# Patient Record
Sex: Male | Born: 1955 | Race: White | Hispanic: No | Marital: Married | Smoking: Former smoker
Health system: Southern US, Community
[De-identification: ages and names within clinical notes are randomized; demographics above are authoritative.]

## PROBLEM LIST (undated history)

## (undated) DIAGNOSIS — I10 Essential (primary) hypertension: Secondary | ICD-10-CM

## (undated) DIAGNOSIS — I2510 Atherosclerotic heart disease of native coronary artery without angina pectoris: Secondary | ICD-10-CM

## (undated) DIAGNOSIS — I5189 Other ill-defined heart diseases: Secondary | ICD-10-CM

## (undated) DIAGNOSIS — R600 Localized edema: Secondary | ICD-10-CM

## (undated) DIAGNOSIS — L309 Dermatitis, unspecified: Secondary | ICD-10-CM

## (undated) DIAGNOSIS — K219 Gastro-esophageal reflux disease without esophagitis: Secondary | ICD-10-CM

## (undated) DIAGNOSIS — I509 Heart failure, unspecified: Secondary | ICD-10-CM

## (undated) DIAGNOSIS — J45909 Unspecified asthma, uncomplicated: Secondary | ICD-10-CM

## (undated) DIAGNOSIS — K649 Unspecified hemorrhoids: Secondary | ICD-10-CM

## (undated) DIAGNOSIS — J189 Pneumonia, unspecified organism: Secondary | ICD-10-CM

## (undated) HISTORY — DX: Other ill-defined heart diseases: I51.89

## (undated) HISTORY — DX: Pneumonia, unspecified organism: J18.9

## (undated) HISTORY — DX: Unspecified asthma, uncomplicated: J45.909

## (undated) HISTORY — DX: Unspecified hemorrhoids: K64.9

## (undated) HISTORY — DX: Dermatitis, unspecified: L30.9

---
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
## 1993-02-22 HISTORY — PX: HEMORRHOID SURGERY: SHX153

## 2001-02-22 HISTORY — PX: OTHER SURGICAL HISTORY: SHX169

## 2001-06-08 ENCOUNTER — Ambulatory Visit (HOSPITAL_COMMUNITY): Admission: RE | Admit: 2001-06-08 | Discharge: 2001-06-08 | Payer: Self-pay | Admitting: *Deleted

## 2001-06-08 ENCOUNTER — Encounter: Payer: Self-pay | Admitting: Neurosurgery

## 2001-06-08 ENCOUNTER — Ambulatory Visit (HOSPITAL_COMMUNITY): Admission: RE | Admit: 2001-06-08 | Discharge: 2001-06-09 | Payer: Self-pay | Admitting: Neurosurgery

## 2005-08-02 ENCOUNTER — Ambulatory Visit: Payer: Self-pay | Admitting: Internal Medicine

## 2006-01-31 ENCOUNTER — Ambulatory Visit: Payer: Self-pay | Admitting: General Surgery

## 2009-08-12 ENCOUNTER — Ambulatory Visit: Payer: Self-pay | Admitting: Internal Medicine

## 2011-02-23 HISTORY — PX: COLONOSCOPY: SHX174

## 2011-06-04 ENCOUNTER — Ambulatory Visit: Payer: Self-pay | Admitting: General Surgery

## 2012-10-09 ENCOUNTER — Encounter: Payer: Self-pay | Admitting: *Deleted

## 2012-10-24 ENCOUNTER — Encounter: Payer: Self-pay | Admitting: General Surgery

## 2012-10-24 ENCOUNTER — Ambulatory Visit (INDEPENDENT_AMBULATORY_CARE_PROVIDER_SITE_OTHER): Payer: Managed Care, Other (non HMO) | Admitting: General Surgery

## 2012-10-24 VITALS — BP 140/90 | HR 80 | Resp 16 | Ht 70.0 in | Wt 221.0 lb

## 2012-10-24 DIAGNOSIS — M7989 Other specified soft tissue disorders: Secondary | ICD-10-CM

## 2012-10-24 DIAGNOSIS — R600 Localized edema: Secondary | ICD-10-CM | POA: Insufficient documentation

## 2012-10-24 NOTE — Progress Notes (Signed)
Patient ID: Alan Fernandez, male   DOB: 1956-02-03, 57 y.o.   MRN: [id number]  Chief Complaint  Patient presents with  . Other    leg swelling    HPI Alan Fernandez is a 57 y.o. male here today for swelling both leg for about 6 months. No history of any trauma. He works out of his home, has to sit for long periods. He did try some diuretic and compression hose and has improved. Patient stopped wearing his stocking last Wednesday.  HPI  Past Medical History  Diagnosis Date  . Asthma   . Hemorrhoid   . Eczema     Past Surgical History  Procedure Laterality Date  . Colonoscopy  2013  . Disk in neck  2003    History reviewed. No pertinent family history.  Social History History  Substance Use Topics  . Smoking status: Never Smoker   . Smokeless tobacco: Never Used  . Alcohol Use: Yes    No Known Allergies  Current Outpatient Prescriptions  Medication Sig Dispense Refill  . montelukast (SINGULAIR) 10 MG tablet Take 1 tablet by mouth daily.       No current facility-administered medications for this visit.    Review of Systems Review of Systems  Constitutional: Negative.   Respiratory: Negative.   Cardiovascular: Positive for leg swelling. Negative for chest pain and palpitations.    Blood pressure 140/90, pulse 80, resp. rate 16, height 5\' 10"  (1.778 m), weight 221 lb (100.245 kg).  Physical Exam Physical Exam  Constitutional: He is oriented to person, place, and time. He appears well-developed and well-nourished.  Eyes: Conjunctivae are normal. No scleral icterus.  Cardiovascular: Normal rate, regular rhythm, S1 normal and normal heart sounds.   Pulses:      Dorsalis pedis pulses are 2+ on the right side, and 2+ on the left side.       Posterior tibial pulses are 2+ on the right side, and 2+ on the left side.  modrate edema bilateral. Mild stasis changes in both legs. Lower part. No skin induration.  Pulmonary/Chest: Breath sounds normal.  Lymphadenopathy:    He has no cervical adenopathy.  Neurological: He is alert and oriented to person, place, and time.  Skin: Skin is warm and dry.    Data Reviewed None   Assessment   Likely has venous insufficeincy.      Plan   Patient to continued  to wear his stocking daily.Patient to return in six week.        SANKAR,SEEPLAPUTHUR G 10/24/2012, 11:37 AM

## 2012-10-24 NOTE — Patient Instructions (Addendum)
Patient to return in six weeks and wear his stockings daily.Rx given stocking with ankle pr of 30-80mm hg

## 2012-12-04 ENCOUNTER — Encounter: Payer: Self-pay | Admitting: General Surgery

## 2012-12-04 ENCOUNTER — Ambulatory Visit (INDEPENDENT_AMBULATORY_CARE_PROVIDER_SITE_OTHER): Payer: Managed Care, Other (non HMO) | Admitting: General Surgery

## 2012-12-04 VITALS — BP 124/70 | HR 74 | Resp 14 | Ht 70.0 in | Wt 221.0 lb

## 2012-12-04 DIAGNOSIS — I83893 Varicose veins of bilateral lower extremities with other complications: Secondary | ICD-10-CM

## 2012-12-04 NOTE — Progress Notes (Signed)
Patient presents for a 6 week follow up venous ultrasound. He says the leg swelling has improved as also the discomfort with use of compression hose. The patient complains of  left foot pain. The pain started approximately 1 month ago. The pain is described as a constant ache- especially when he wakes up in am.  Exam shows only scant edema in legs. No skin breaks noted. Stasis discoloration lower half of legs- less prominent today.    Venous Duplex Lower Extremities.  Duplex study of the common femoral vein femoral vein and optically laying on both sides reveals no evidence of DVT. The deep veins are easily compressible with no flow disturbance was noted. The right and left GSV are  showing abnormal reflux approaching 2 seconds.   Both LSV  are normal.   Above findings discussed with pt. He seems comfortable with use of compression hose and iis advised on continued usage.  If there is poor tolerance of this regimen or failure of compression to relieve leg swelling and discomfort, RF ablation can be considered.

## 2012-12-04 NOTE — Patient Instructions (Signed)
Patient advised to continue use of compression hose on a daily basis. Patient to return in 2 months.

## 2012-12-05 ENCOUNTER — Encounter: Payer: Self-pay | Admitting: General Surgery

## 2013-01-17 ENCOUNTER — Ambulatory Visit (INDEPENDENT_AMBULATORY_CARE_PROVIDER_SITE_OTHER): Payer: Managed Care, Other (non HMO) | Admitting: Podiatry

## 2013-01-17 ENCOUNTER — Encounter: Payer: Self-pay | Admitting: Podiatry

## 2013-01-17 ENCOUNTER — Ambulatory Visit (INDEPENDENT_AMBULATORY_CARE_PROVIDER_SITE_OTHER): Payer: Managed Care, Other (non HMO)

## 2013-01-17 VITALS — BP 141/83 | HR 86 | Resp 16 | Ht 70.0 in | Wt 210.0 lb

## 2013-01-17 DIAGNOSIS — M79609 Pain in unspecified limb: Secondary | ICD-10-CM

## 2013-01-17 DIAGNOSIS — M79672 Pain in left foot: Secondary | ICD-10-CM

## 2013-01-17 DIAGNOSIS — M722 Plantar fascial fibromatosis: Secondary | ICD-10-CM

## 2013-01-17 MED ORDER — METHYLPREDNISOLONE (PAK) 4 MG PO TABS: ORAL_TABLET | ORAL | Status: DC

## 2013-01-17 MED ORDER — MELOXICAM 15 MG PO TABS: 15.0000 mg | ORAL_TABLET | Freq: Every day | ORAL | Status: DC

## 2013-01-17 NOTE — Patient Instructions (Signed)
Plantar Fasciitis (Heel Spur Syndrome) with Rehab The plantar fascia is a fibrous, ligament-like, soft-tissue structure that spans the bottom of the foot. Plantar fasciitis is a condition that causes pain in the foot due to inflammation of the tissue. SYMPTOMS   Pain and tenderness on the underneath side of the foot.  Pain that worsens with standing or walking. CAUSES  Plantar fasciitis is caused by irritation and injury to the plantar fascia on the underneath side of the foot. Common mechanisms of injury include:  Direct trauma to bottom of the foot.  Damage to a small nerve that runs under the foot where the main fascia attaches to the heel bone.  Stress placed on the plantar fascia due to bone spurs. RISK INCREASES WITH:   Activities that place stress on the plantar fascia (running, jumping, pivoting, or cutting).  Poor strength and flexibility.  Improperly fitted shoes.  Tight calf muscles.  Flat feet.  Failure to warm-up properly before activity.  Obesity. PREVENTION  Warm up and stretch properly before activity.  Allow for adequate recovery between workouts.  Maintain physical fitness:  Strength, flexibility, and endurance.  Cardiovascular fitness.  Maintain a health body weight.  Avoid stress on the plantar fascia.  Wear properly fitted shoes, including arch supports for individuals who have flat feet. PROGNOSIS  If treated properly, then the symptoms of plantar fasciitis usually resolve without surgery. However, occasionally surgery is necessary. RELATED COMPLICATIONS   Recurrent symptoms that may result in a chronic condition.  Problems of the lower back that are caused by compensating for the injury, such as limping.  Pain or weakness of the foot during push-off following surgery.  Chronic inflammation, scarring, and partial or complete fascia tear, occurring more often from repeated injections. TREATMENT  Treatment initially involves the use of  ice and medication to help reduce pain and inflammation. The use of strengthening and stretching exercises may help reduce pain with activity, especially stretches of the Achilles tendon. These exercises may be performed at home or with a therapist. Your caregiver may recommend that you use heel cups of arch supports to help reduce stress on the plantar fascia. Occasionally, corticosteroid injections are given to reduce inflammation. If symptoms persist for greater than 6 months despite non-surgical (conservative), then surgery may be recommended.  MEDICATION   If pain medication is necessary, then nonsteroidal anti-inflammatory medications, such as aspirin and ibuprofen, or other minor pain relievers, such as acetaminophen, are often recommended.  Do not take pain medication within 7 days before surgery.  Prescription pain relievers may be given if deemed necessary by your caregiver. Use only as directed and only as much as you need.  Corticosteroid injections may be given by your caregiver. These injections should be reserved for the most serious cases, because they may only be given a certain number of times. HEAT AND COLD  Cold treatment (icing) relieves pain and reduces inflammation. Cold treatment should be applied for 10 to 15 minutes every 2 to 3 hours for inflammation and pain and immediately after any activity that aggravates your symptoms. Use ice packs or massage the area with a piece of ice (ice massage).  Heat treatment may be used prior to performing the stretching and strengthening activities prescribed by your caregiver, physical therapist, or athletic trainer. Use a heat pack or soak the injury in warm water. SEEK IMMEDIATE MEDICAL CARE IF:  Treatment seems to offer no benefit, or the condition worsens.  Any medications produce adverse side effects. EXERCISES RANGE   OF MOTION (ROM) AND STRETCHING EXERCISES - Plantar Fasciitis (Heel Spur Syndrome) These exercises may help you  when beginning to rehabilitate your injury. Your symptoms may resolve with or without further involvement from your physician, physical therapist or athletic trainer. While completing these exercises, remember:   Restoring tissue flexibility helps normal motion to return to the joints. This allows healthier, less painful movement and activity.  An effective stretch should be held for at least 30 seconds.  A stretch should never be painful. You should only feel a gentle lengthening or release in the stretched tissue. RANGE OF MOTION - Toe Extension, Flexion  Sit with your right / left leg crossed over your opposite knee.  Grasp your toes and gently pull them back toward the top of your foot. You should feel a stretch on the bottom of your toes and/or foot.  Hold this stretch for __________ seconds.  Now, gently pull your toes toward the bottom of your foot. You should feel a stretch on the top of your toes and or foot.  Hold this stretch for __________ seconds. Repeat __________ times. Complete this stretch __________ times per day.  RANGE OF MOTION - Ankle Dorsiflexion, Active Assisted  Remove shoes and sit on a chair that is preferably not on a carpeted surface.  Place right / left foot under knee. Extend your opposite leg for support.  Keeping your heel down, slide your right / left foot back toward the chair until you feel a stretch at your ankle or calf. If you do not feel a stretch, slide your bottom forward to the edge of the chair, while still keeping your heel down.  Hold this stretch for __________ seconds. Repeat __________ times. Complete this stretch __________ times per day.  STRETCH  Gastroc, Standing  Place hands on wall.  Extend right / left leg, keeping the front knee somewhat bent.  Slightly point your toes inward on your back foot.  Keeping your right / left heel on the floor and your knee straight, shift your weight toward the wall, not allowing your back to  arch.  You should feel a gentle stretch in the right / left calf. Hold this position for __________ seconds. Repeat __________ times. Complete this stretch __________ times per day. STRETCH  Soleus, Standing  Place hands on wall.  Extend right / left leg, keeping the other knee somewhat bent.  Slightly point your toes inward on your back foot.  Keep your right / left heel on the floor, bend your back knee, and slightly shift your weight over the back leg so that you feel a gentle stretch deep in your back calf.  Hold this position for __________ seconds. Repeat __________ times. Complete this stretch __________ times per day. STRETCH  Gastrocsoleus, Standing  Note: This exercise can place a lot of stress on your foot and ankle. Please complete this exercise only if specifically instructed by your caregiver.   Place the ball of your right / left foot on a step, keeping your other foot firmly on the same step.  Hold on to the wall or a rail for balance.  Slowly lift your other foot, allowing your body weight to press your heel down over the edge of the step.  You should feel a stretch in your right / left calf.  Hold this position for __________ seconds.  Repeat this exercise with a slight bend in your right / left knee. Repeat __________ times. Complete this stretch __________ times per day.    STRENGTHENING EXERCISES - Plantar Fasciitis (Heel Spur Syndrome)  These exercises may help you when beginning to rehabilitate your injury. They may resolve your symptoms with or without further involvement from your physician, physical therapist or athletic trainer. While completing these exercises, remember:   Muscles can gain both the endurance and the strength needed for everyday activities through controlled exercises.  Complete these exercises as instructed by your physician, physical therapist or athletic trainer. Progress the resistance and repetitions only as guided. STRENGTH - Towel  Curls  Sit in a chair positioned on a non-carpeted surface.  Place your foot on a towel, keeping your heel on the floor.  Pull the towel toward your heel by only curling your toes. Keep your heel on the floor.  If instructed by your physician, physical therapist or athletic trainer, add ____________________ at the end of the towel. Repeat __________ times. Complete this exercise __________ times per day. STRENGTH - Ankle Inversion  Secure one end of a rubber exercise band/tubing to a fixed object (table, pole). Loop the other end around your foot just before your toes.  Place your fists between your knees. This will focus your strengthening at your ankle.  Slowly, pull your big toe up and in, making sure the band/tubing is positioned to resist the entire motion.  Hold this position for __________ seconds.  Have your muscles resist the band/tubing as it slowly pulls your foot back to the starting position. Repeat __________ times. Complete this exercises __________ times per day.  Document Released: 02/08/2005 Document Revised: 05/03/2011 Document Reviewed: 05/23/2008 [employer] Patient Information 2014 [employer]. Plantar Fasciitis Plantar fasciitis is a common condition that causes foot pain. It is soreness (inflammation) of the band of tough fibrous tissue on the bottom of the foot that runs from the heel bone (calcaneus) to the ball of the foot. The cause of this soreness may be from excessive standing, poor fitting shoes, running on hard surfaces, being overweight, having an abnormal walk, or overuse (this is common in runners) of the painful foot or feet. It is also common in aerobic exercise dancers and ballet dancers. SYMPTOMS  Most people with plantar fasciitis complain of:  Severe pain in the morning on the bottom of their foot especially when taking the first steps out of bed. This pain recedes after a few minutes of walking.  Severe pain is experienced also during walking  following a long period of inactivity.  Pain is worse when walking barefoot or up stairs DIAGNOSIS   Your caregiver will diagnose this condition by examining and feeling your foot.  Special tests such as X-rays of your foot, are usually not needed. PREVENTION   Consult a sports medicine professional before beginning a new exercise program.  Walking programs offer a good workout. With walking there is a lower chance of overuse injuries common to runners. There is less impact and less jarring of the joints.  Begin all new exercise programs slowly. If problems or pain develop, decrease the amount of time or distance until you are at a comfortable level.  Wear good shoes and replace them regularly.  Stretch your foot and the heel cords at the back of the ankle (Achilles tendon) both before and after exercise.  Run or exercise on even surfaces that are not hard. For example, asphalt is better than pavement.  Do not run barefoot on hard surfaces.  If using a treadmill, vary the incline.  Do not continue to workout if you have foot or joint   problems. Seek professional help if they do not improve. HOME CARE INSTRUCTIONS   Avoid activities that cause you pain until you recover.  Use ice or cold packs on the problem or painful areas after working out.  Only take over-the-counter or prescription medicines for pain, discomfort, or fever as directed by your caregiver.  Soft shoe inserts or athletic shoes with air or gel sole cushions may be helpful.  If problems continue or become more severe, consult a sports medicine caregiver or your own health care provider. Cortisone is a potent anti-inflammatory medication that may be injected into the painful area. You can discuss this treatment with your caregiver. MAKE SURE YOU:   Understand these instructions.  Will watch your condition.  Will get help right away if you are not doing well or get worse. Document Released: 11/03/2000 Document  Revised: 05/03/2011 Document Reviewed: 01/03/2008 [employer] Patient Information 2014 [employer].  

## 2013-01-17 NOTE — Progress Notes (Signed)
  Subjective:    Patient ID: Alan Fernandez, male    DOB: Jul 11, 1955, 57 y.o.   MRN: [id number]  HPI Comments: N achey, rash  L left foot lateral side , top of foot rash  D 2 months  O all of sudden  C about the same depends on the day  A first step in morning or after sitting for a while  T no treatment   Foot Pain Associated symptoms include a rash.      Review of Systems  HENT:       Ringing in ears   Cardiovascular: Positive for leg swelling.  Musculoskeletal:       Difficulty walking   Skin: Positive for rash.  Hematological: Bruises/bleeds easily.  All other systems reviewed and are negative.       Objective:   Physical Exam: I have reviewed his past medical history medications and allergies. His review of systems is unremarkable and noncontributory to his symptoms. Vital signs are stable he is alert and oriented x3. Pulses are strongly palpable to the bilateral lower extremities. Capillary fill time to digits one through 5 bilateral is immediate. Neurologic sensorium is intact per since once the monofilament bilateral. Deep tendon reflexes are intact and brisk bilateral. Muscle strength was 5 over 5 dorsiflexors plantar flexors inverters and everters all intrinsic musculature is intact. He has tenderness on palpation of the fourth fifth metatarsal area of his left foot extending from the fourth fifth metatarsal base of the left foot distally toward the head of the fifth metatarsal. He has no pain on medial lateral compression of the calcaneus however upon palpation of the medial calcaneal tubercle he does have pain associated with this area radiographic evaluation does not demonstrate any type of osseous abnormalities in the foot whatsoever. He does have soft tissue increase in density at the plantar fascial calcaneal insertion site. This is indicative of plantar fasciitis. Cutaneous evaluation demonstrates supple well hydrated cutis. He does have a few small flaps to the dorsal  aspect of the left foot. These plaques a small an oval in nature slightly raise pain with a very thin scale. This is consistent with psoriasis however we cannot rule out eczematous dermatitis at this point.        Assessment & Plan:  Assessment: Plantar fasciitis left foot with lateral compensatory syndrome left.  Plan: We discussed the etiology pathology conservative versus surgical therapies. At this point I injected his left heel with 20 mg of Kenalog and local anesthetic. Put him in a plantar fascial brace. And dispensed a night splint. Wrote her prescription for Medrol Dosepak to be followed by Mobic. We discussed stretching exercises shoe gear modification and ice therapy. He was also given a handout on this. We'll followup with him in one month. He will call sooner if needed.

## 2013-02-06 ENCOUNTER — Ambulatory Visit (INDEPENDENT_AMBULATORY_CARE_PROVIDER_SITE_OTHER): Payer: Managed Care, Other (non HMO) | Admitting: General Surgery

## 2013-02-06 ENCOUNTER — Encounter: Payer: Self-pay | Admitting: General Surgery

## 2013-02-06 VITALS — BP 158/78 | HR 78 | Resp 14 | Ht 70.0 in | Wt 218.0 lb

## 2013-02-06 DIAGNOSIS — I83893 Varicose veins of bilateral lower extremities with other complications: Secondary | ICD-10-CM

## 2013-02-06 NOTE — Progress Notes (Signed)
Patient ID: Alan Fernandez, male   DOB: 07-06-1955, 57 y.o.   MRN: [id number]  Chief Complaint  Patient presents with  . Varicose Veins    HPI Alan Fernandez is a 57 y.o. male here today for varicose veins.Patient still is still wearing his compression   stockings. Says he has not had any leg symptoms. HPI  Past Medical History  Diagnosis Date  . Asthma   . Hemorrhoid   . Eczema     Past Surgical History  Procedure Laterality Date  . Colonoscopy  2013  . Disk in neck  2003    History reviewed. No pertinent family history.  Social History History  Substance Use Topics  . Smoking status: Never Smoker   . Smokeless tobacco: Never Used  . Alcohol Use: Yes    No Known Allergies  Current Outpatient Prescriptions  Medication Sig Dispense Refill  . clobetasol (TEMOVATE) 0.05 % external solution       . meloxicam (MOBIC) 15 MG tablet Take 1 tablet (15 mg total) by mouth daily.  30 tablet  3  . methylPREDNIsolone (MEDROL DOSPACK) 4 MG tablet follow package directions  21 tablet  0  . montelukast (SINGULAIR) 10 MG tablet Take 1 tablet by mouth daily.      Marland Kitchen NAFTIN 2 % GEL       . terbinafine (LAMISIL) 250 MG tablet Take 1 tablet by mouth daily.      . VENTOLIN HFA 108 (90 BASE) MCG/ACT inhaler        No current facility-administered medications for this visit.    Review of Systems Review of Systems  Blood pressure 158/78, pulse 78, resp. rate 14, height 5\' 10"  (1.778 m), weight 218 lb (98.884 kg).  Physical Exam Physical Exam  Constitutional: He is oriented to person, place, and time. He appears well-developed and well-nourished.  Cardiovascular:  Pulses:      Dorsalis pedis pulses are 2+ on the right side, and 2+ on the left side.       Posterior tibial pulses are 2+ on the right side, and 2+ on the left side.  Minimal swelling. No skin thickening or color changes. He seems comfortable with use of compression hose and is advised on continued usage.      Neurological: He is alert and oriented to person, place, and time.  Skin: Skin is warm and dry.    Data Reviewed Prior Duplex showing bilateral GSV reflux. Assessment    VV with good control usin compression hose.     Plan   Patient to return in six month for exam and ultrasound.       SANKAR,SEEPLAPUTHUR G 02/06/2013, 7:29 PM

## 2013-02-06 NOTE — Patient Instructions (Addendum)
Patient to return in six month for exam and ultrasound.

## 2013-02-12 ENCOUNTER — Encounter: Payer: Self-pay | Admitting: Podiatry

## 2013-02-12 ENCOUNTER — Ambulatory Visit (INDEPENDENT_AMBULATORY_CARE_PROVIDER_SITE_OTHER): Payer: Managed Care, Other (non HMO) | Admitting: Podiatry

## 2013-02-12 VITALS — BP 135/83 | HR 77 | Resp 16

## 2013-02-12 DIAGNOSIS — M722 Plantar fascial fibromatosis: Secondary | ICD-10-CM

## 2013-02-12 NOTE — Progress Notes (Signed)
   Subjective:    Patient ID: Alan Fernandez, male    DOB: February 10, 1956, 57 y.o.   MRN: [id number]  HPI Comments: "its a 100% better, does not hurt at all "     Review of Systems     Objective:   Physical Exam: Pulses remain palpable left foot. No pain on palpation medial continued tubercle of the left heel.        Assessment & Plan:  Assessment: Resolving plantar fasciitis left.  Plan: Continue all conservative therapies x1 month than taper off.

## 2013-08-08 ENCOUNTER — Ambulatory Visit (INDEPENDENT_AMBULATORY_CARE_PROVIDER_SITE_OTHER): Payer: Managed Care, Other (non HMO) | Admitting: General Surgery

## 2013-08-08 ENCOUNTER — Other Ambulatory Visit: Payer: Managed Care, Other (non HMO)

## 2013-08-08 ENCOUNTER — Encounter: Payer: Self-pay | Admitting: General Surgery

## 2013-08-08 VITALS — BP 130/64 | HR 70 | Resp 12 | Ht 70.0 in | Wt 221.0 lb

## 2013-08-08 DIAGNOSIS — I83893 Varicose veins of bilateral lower extremities with other complications: Secondary | ICD-10-CM

## 2013-08-08 DIAGNOSIS — I872 Venous insufficiency (chronic) (peripheral): Secondary | ICD-10-CM

## 2013-08-08 NOTE — Progress Notes (Signed)
Patient ID: Alan Fernandez, male   DOB: [birth year], 58 y.o.   MRN: [id number]  Chief Complaint  Patient presents with  . Varicose Veins    Alan Fernandez is a 58 y.o. male.  Here today for follow up varicose veins. He is wearing his compression stockings and have no symptoms. He says he has not noticed any discomfort with the use of the stockings.  Alan  Past Medical History  Diagnosis Date  . Asthma   . Hemorrhoid   . Eczema     Past Surgical History  Procedure Laterality Date  . Colonoscopy  2013  . Disk in neck  2003    History reviewed. No pertinent family history.  Social History History  Substance Use Topics  . Smoking status: Never Smoker   . Smokeless tobacco: Never Used  . Alcohol Use: Yes    No Known Allergies  Current Outpatient Prescriptions  Medication Sig Dispense Refill  . clobetasol (TEMOVATE) 0.05 % external solution       . montelukast (SINGULAIR) 10 MG tablet Take 1 tablet by mouth daily.      . VENTOLIN HFA 108 (90 BASE) MCG/ACT inhaler        No current facility-administered medications for this visit.    Review of Systems Review of Systems  Constitutional: Negative.   Respiratory: Negative.   Cardiovascular: Negative.     Blood pressure 130/64, pulse 70, resp. rate 12, height 5\' 10"  (1.778 m), weight 221 lb (100.245 kg).  Physical Exam Physical Exam  Constitutional: He is oriented to person, place, and time. He appears well-developed and well-nourished.  Neck: Neck supple.  Cardiovascular: Intact distal pulses.   1 + edema bilateral lower extremities which has markedly improved since last exam. No varicose veins noted. No induration of skin.  Lymphadenopathy:    He has no cervical adenopathy.  Neurological: He is alert and oriented to person, place, and time.  Skin: Skin is warm and dry.    Data Reviewed Prior office ultrasound and notes. Duplex study repeated today- both GSV with reflux in excess of 3secs. Was 2 secs 1 yr  ago. Single non prominent perforator seen inner aspect of both legs above ankle. Assessment    1 + edema bilateral lower extremities which has markedly improved since last exam. No varicose veins noted. No induration of skin.  Increase in reflux time in both GSV.  Venous insufficiency under good control.   Plan    Continue to wear compression hose. Follow up in one year.       SANKAR,SEEPLAPUTHUR G 08/09/2013, 9:23 AM

## 2013-08-08 NOTE — Patient Instructions (Addendum)
Continue to wear compression hose. The patient is aware to call back for any questions or concerns.

## 2013-08-09 ENCOUNTER — Encounter: Payer: Self-pay | Admitting: General Surgery

## 2013-08-09 DIAGNOSIS — I872 Venous insufficiency (chronic) (peripheral): Secondary | ICD-10-CM | POA: Insufficient documentation

## 2014-03-25 DIAGNOSIS — J189 Pneumonia, unspecified organism: Secondary | ICD-10-CM

## 2014-03-25 HISTORY — DX: Pneumonia, unspecified organism: J18.9

## 2014-06-26 ENCOUNTER — Ambulatory Visit (INDEPENDENT_AMBULATORY_CARE_PROVIDER_SITE_OTHER): Payer: Managed Care, Other (non HMO) | Admitting: General Surgery

## 2014-06-26 ENCOUNTER — Encounter: Payer: Self-pay | Admitting: General Surgery

## 2014-06-26 VITALS — BP 124/74 | HR 82 | Resp 14 | Ht 70.0 in | Wt 221.0 lb

## 2014-06-26 DIAGNOSIS — I872 Venous insufficiency (chronic) (peripheral): Secondary | ICD-10-CM | POA: Diagnosis not present

## 2014-06-26 NOTE — Progress Notes (Signed)
Patient ID: Alan Fernandez, male   DOB: 09-29-1955, 59 y.o.   MRN: [id number]  Chief Complaint  Patient presents with  . Follow-up    varicose veins and left leg edema    HPI Alan Fernandez is a 59 y.o. male.  Here today for follow up varicose veins and left leg edema. He states about 3 weeks ago he started having left lower lateral leg pain and edema. He states it has improved over the 3-4 days. Right leg is fine. He does admit to the left knee hurting. He did have lower back ache yesterday. He has been wear his compression hose (30-40) daily.  He does admit to some sinus congestion.  HPI     Past Medical History  Diagnosis Date  . Asthma   . Hemorrhoid   . Eczema   . Pneumonia Feb 2016    Past Surgical History  Procedure Laterality Date  . Colonoscopy  2013  . Disk in neck  2003    History reviewed. No pertinent family history.  Social History History  Substance Use Topics  . Smoking status: Never Smoker   . Smokeless tobacco: Never Used  . Alcohol Use: Yes    No Known Allergies  Current Outpatient Prescriptions  Medication Sig Dispense Refill  . clobetasol (TEMOVATE) 0.05 % external solution     . montelukast (SINGULAIR) 10 MG tablet Take 1 tablet by mouth daily.    . VENTOLIN HFA 108 (90 BASE) MCG/ACT inhaler      No current facility-administered medications for this visit.    Review of Systems Review of Systems  Constitutional: Negative.   Respiratory: Positive for cough.   Cardiovascular: Negative.   Neurological: Positive for headaches.    Blood pressure 124/74, pulse 82, resp. rate 14, height 5\' 10"  (1.778 m), weight 221 lb (100.245 kg).  Physical Exam Physical Exam  Constitutional: He is oriented to person, place, and time. He appears well-developed and well-nourished.  Eyes: Conjunctivae are normal. No scleral icterus.  Neck: Neck supple.  Cardiovascular: Normal rate, regular rhythm and normal heart sounds.   Pulses:      Dorsalis pedis  pulses are 2+ on the right side, and 2+ on the left side.       Posterior tibial pulses are 2+ on the right side, and 2+ on the left side.  Moderate bilateral lower leg edema present. No visible varicose veins.  Pulmonary/Chest: Effort normal and breath sounds normal.  Abdominal: Soft. There is no tenderness.  Lymphadenopathy:    He has no cervical adenopathy.  Neurological: He is alert and oriented to person, place, and time.  Skin: Skin is warm and dry.    Data Reviewed Prior notes.  Assessment    Pt has moderate edema in spite of compliant use of compression hose. He has had no symptoms to suggest cardiac or renal source for the edema.     Plan    Check chemistry and CBC. If labs are normal, will repeat Duplex study. He was noted to have bil GSV reflux to 2 secs -64yrs ago     PCP:  Wyatt Mage G 06/26/2014, 11:24 AM

## 2014-06-26 NOTE — Patient Instructions (Addendum)
The patient is aware to call back for any questions or concerns. Lab work today. Continue with compression stockings

## 2014-06-27 ENCOUNTER — Telehealth: Payer: Self-pay | Admitting: *Deleted

## 2014-06-27 LAB — CBC WITH DIFFERENTIAL/PLATELET
Basophils Absolute: 0 10*3/uL (ref 0.0–0.2)
Basos: 0 %
EOS (ABSOLUTE): 0 10*3/uL (ref 0.0–0.4)
Eos: 1 %
HEMATOCRIT: 40.1 % (ref 37.5–51.0)
HEMOGLOBIN: 14.2 g/dL (ref 12.6–17.7)
IMMATURE GRANS (ABS): 0 10*3/uL (ref 0.0–0.1)
IMMATURE GRANULOCYTES: 0 %
Lymphocytes Absolute: 1 10*3/uL (ref 0.7–3.1)
Lymphs: 24 %
MCH: 32.3 pg (ref 26.6–33.0)
MCHC: 35.4 g/dL (ref 31.5–35.7)
MCV: 91 fL (ref 79–97)
MONOCYTES: 14 %
Monocytes Absolute: 0.6 10*3/uL (ref 0.1–0.9)
NEUTROS ABS: 2.7 10*3/uL (ref 1.4–7.0)
Neutrophils: 61 %
Platelets: 180 10*3/uL (ref 150–379)
RBC: 4.4 x10E6/uL (ref 4.14–5.80)
RDW: 13.1 % (ref 12.3–15.4)
WBC: 4.3 10*3/uL (ref 3.4–10.8)

## 2014-06-27 LAB — COMPREHENSIVE METABOLIC PANEL
A/G RATIO: 2.3 (ref 1.1–2.5)
ALK PHOS: 62 IU/L (ref 39–117)
ALT: 19 IU/L (ref 0–44)
AST: 21 IU/L (ref 0–40)
Albumin: 4.1 g/dL (ref 3.5–5.5)
BUN/Creatinine Ratio: 7 — ABNORMAL LOW (ref 9–20)
BUN: 10 mg/dL (ref 6–24)
Bilirubin Total: 0.8 mg/dL (ref 0.0–1.2)
CO2: 23 mmol/L (ref 18–29)
Calcium: 8.9 mg/dL (ref 8.7–10.2)
Chloride: 100 mmol/L (ref 97–108)
Creatinine, Ser: 1.39 mg/dL — ABNORMAL HIGH (ref 0.76–1.27)
GFR calc non Af Amer: 55 mL/min/{1.73_m2} — ABNORMAL LOW (ref >59)
GFR, EST AFRICAN AMERICAN: 64 mL/min/{1.73_m2} (ref >59)
Globulin, Total: 1.8 g/dL (ref 1.5–4.5)
Glucose: 91 mg/dL (ref 65–99)
POTASSIUM: 4.2 mmol/L (ref 3.5–5.2)
SODIUM: 139 mmol/L (ref 134–144)
Total Protein: 5.9 g/dL — ABNORMAL LOW (ref 6.0–8.5)

## 2014-06-27 NOTE — Progress Notes (Signed)
Quick Note:  Inform pt labs are normal. F/u as scheduled Copy to Dr. Hall Busing ______

## 2014-06-27 NOTE — Telephone Encounter (Signed)
Notified patient as instructed, patient pleased. Discussed follow-up appointments, patient agrees  

## 2014-06-27 NOTE — Telephone Encounter (Signed)
-----   Message from Christene Lye, MD sent at 06/27/2014  6:15 AM EDT ----- Inform pt labs are normal. F/u as scheduled Copy to Dr. Hall Busing

## 2014-08-07 ENCOUNTER — Telehealth: Payer: Self-pay | Admitting: *Deleted

## 2014-08-07 ENCOUNTER — Ambulatory Visit: Payer: Managed Care, Other (non HMO)

## 2014-08-07 ENCOUNTER — Ambulatory Visit (INDEPENDENT_AMBULATORY_CARE_PROVIDER_SITE_OTHER): Payer: Managed Care, Other (non HMO) | Admitting: General Surgery

## 2014-08-07 ENCOUNTER — Encounter: Payer: Self-pay | Admitting: General Surgery

## 2014-08-07 VITALS — BP 154/70 | HR 80 | Resp 12 | Ht 70.0 in | Wt 216.0 lb

## 2014-08-07 DIAGNOSIS — I872 Venous insufficiency (chronic) (peripheral): Secondary | ICD-10-CM

## 2014-08-07 NOTE — Progress Notes (Signed)
Patient ID: EDOARDO LAFORTE, male   DOB: 1955-12-16, 59 y.o.   MRN: [id number]  Chief Complaint  Patient presents with  . Other    varicose veins and left leg edema    HPI SAMUEL MCPEEK is a 59 y.o. male here today for his one month follow for left leg edema.  He states the swelling is as bad as it was. He is wearing his compression hose daily. The left leg does still hurts  mostly in the knee area. On weekends, legs will be achy by the end of the day.   HPI  Past Medical History  Diagnosis Date  . Asthma   . Hemorrhoid   . Eczema   . Pneumonia Feb 2016    Past Surgical History  Procedure Laterality Date  . Colonoscopy  2013  . Disk in neck  2003    History reviewed. No pertinent family history.  Social History History  Substance Use Topics  . Smoking status: Never Smoker   . Smokeless tobacco: Never Used  . Alcohol Use: Yes    No Known Allergies  Current Outpatient Prescriptions  Medication Sig Dispense Refill  . clobetasol (TEMOVATE) 0.05 % external solution     . montelukast (SINGULAIR) 10 MG tablet Take 1 tablet by mouth daily.    . VENTOLIN HFA 108 (90 BASE) MCG/ACT inhaler      No current facility-administered medications for this visit.    Review of Systems Review of Systems  Constitutional: Negative.   Respiratory: Negative.   Cardiovascular: Negative.     Blood pressure 154/70, pulse 80, resp. rate 12, height 5\' 10"  (1.778 m), weight 216 lb (97.977 kg).  Physical Exam Physical Exam  Constitutional: He is oriented to person, place, and time. He appears well-developed and well-nourished.  Cardiovascular:  Pulses:      Dorsalis pedis pulses are 2+ on the right side, and 2+ on the left side.       Posterior tibial pulses are 2+ on the right side, and 2+ on the left side.  Mild to moderate edema in both legs. Some skin discoloration on right lower leg, medially, without induration or tenderness.  Pulmonary/Chest: Effort normal.  Neurological: He  is alert and oriented to person, place, and time.  Skin: Skin is warm and dry.    Data Reviewed Previous office notes. Left lower extremity duplex study was performed, showing significant reflux in GSV. Also has similar reflux in the right GSV. Deep system appears to be normal.  Assessment    Venous insufficiency bilaterally contributing to leg swelling and end of the day discomfort. Suspect that knee pain is not related to venous insufficiency and since this knee pain is significant, recommend orthopedic evaluation.     Plan   Use udder cream on legs. Call office if pain, thickening, discoloration, and soreness noted in lower legs.   Get knee evaluated by orthopedist.  Follow up in one month      PCP:  Donnamarie Poag 08/07/2014, 3:53 PM

## 2014-08-07 NOTE — Telephone Encounter (Signed)
Patient has been scheduled for an appointment with Dr. Thornton Park, orthopedist, for 08-15-14 at 3 pm (arrive 2:50 pm).   He is aware of date, time, and instructions. Patient verbalizes understanding.

## 2014-08-07 NOTE — Patient Instructions (Addendum)
The patient is aware to call back for any questions or concerns. continue to wear compression hose Follow up in one month

## 2014-08-19 ENCOUNTER — Ambulatory Visit: Payer: Self-pay | Admitting: General Surgery

## 2014-09-05 ENCOUNTER — Ambulatory Visit (INDEPENDENT_AMBULATORY_CARE_PROVIDER_SITE_OTHER): Payer: Managed Care, Other (non HMO) | Admitting: General Surgery

## 2014-09-05 ENCOUNTER — Encounter: Payer: Self-pay | Admitting: General Surgery

## 2014-09-05 VITALS — BP 158/72 | HR 76 | Resp 14 | Ht 70.0 in | Wt 217.0 lb

## 2014-09-05 DIAGNOSIS — I872 Venous insufficiency (chronic) (peripheral): Secondary | ICD-10-CM

## 2014-09-05 NOTE — Patient Instructions (Signed)
Follow up in one year. Continue to wear compression stockings daily.

## 2014-09-05 NOTE — Progress Notes (Signed)
Patient ID: Alan Fernandez, male   DOB: 1955-05-08, 59 y.o.   MRN: [id number]  Chief Complaint  Patient presents with  . Other    varicose veins    HPI Alan Fernandez is a 59 y.o. male here for follow up for varicose veins. He states that he has been doing well and has had no problems with swelling in his legs. He has been wearing his compression hose daily.  HPI  Past Medical History  Diagnosis Date  . Asthma   . Hemorrhoid   . Eczema   . Pneumonia Feb 2016    Past Surgical History  Procedure Laterality Date  . Colonoscopy  2013  . Disk in neck  2003    History reviewed. No pertinent family history.  Social History History  Substance Use Topics  . Smoking status: Never Smoker   . Smokeless tobacco: Never Used  . Alcohol Use: Yes    No Known Allergies  Current Outpatient Prescriptions  Medication Sig Dispense Refill  . clobetasol (TEMOVATE) 0.05 % external solution     . montelukast (SINGULAIR) 10 MG tablet Take 1 tablet by mouth daily.    . VENTOLIN HFA 108 (90 BASE) MCG/ACT inhaler      No current facility-administered medications for this visit.    Review of Systems Review of Systems  Constitutional: Negative.   Respiratory: Negative.   Cardiovascular: Negative.   Skin: Negative.     Blood pressure 158/72, pulse 76, resp. rate 14, height 5\' 10"  (1.778 m), weight 217 lb (98.431 kg).  Physical Exam Physical Exam  Constitutional: He is oriented to person, place, and time. He appears well-developed and well-nourished.  Neurological: He is alert and oriented to person, place, and time.  Skin: Skin is warm, dry and intact.  No skin thickening in the lower legs.  All pulses good. Mild edema in both legs, but significantly better than previous exam. No stasis changes in the leg.   Psychiatric: He has a normal mood and affect.    Data Reviewed Prior notes  Assessment    Venous insufficiemncy. Documented bilateral GSV reflux. Pt with no symptoms other  than mild swelling     Plan    Continuw use of compression hose.     PCP: Dr Trudie Reed 09/09/2014, 8:02 AM

## 2014-09-09 ENCOUNTER — Encounter: Payer: Self-pay | Admitting: General Surgery

## 2014-10-31 ENCOUNTER — Other Ambulatory Visit: Payer: Self-pay | Admitting: Internal Medicine

## 2014-10-31 DIAGNOSIS — R1032 Left lower quadrant pain: Secondary | ICD-10-CM

## 2014-11-05 ENCOUNTER — Ambulatory Visit: Admission: RE | Admit: 2014-11-05 | Payer: Managed Care, Other (non HMO) | Source: Ambulatory Visit

## 2014-11-08 ENCOUNTER — Ambulatory Visit
Admission: RE | Admit: 2014-11-08 | Discharge: 2014-11-08 | Disposition: A | Discharge status: Home / Self Care | Payer: Managed Care, Other (non HMO) | Source: Ambulatory Visit | Attending: Internal Medicine | Admitting: Internal Medicine

## 2014-11-08 DIAGNOSIS — K769 Liver disease, unspecified: Secondary | ICD-10-CM | POA: Insufficient documentation

## 2014-11-08 DIAGNOSIS — R1900 Intra-abdominal and pelvic swelling, mass and lump, unspecified site: Secondary | ICD-10-CM | POA: Diagnosis not present

## 2014-11-08 DIAGNOSIS — K469 Unspecified abdominal hernia without obstruction or gangrene: Secondary | ICD-10-CM | POA: Insufficient documentation

## 2014-11-08 DIAGNOSIS — I2510 Atherosclerotic heart disease of native coronary artery without angina pectoris: Secondary | ICD-10-CM | POA: Insufficient documentation

## 2014-11-08 DIAGNOSIS — R1032 Left lower quadrant pain: Secondary | ICD-10-CM | POA: Insufficient documentation

## 2014-11-08 MED ORDER — IOHEXOL 300 MG/ML  SOLN
100.0000 mL | Freq: Once | INTRAMUSCULAR | Status: AC | PRN
  Administered 2014-11-08: 100 mL via INTRAVENOUS

## 2014-11-14 ENCOUNTER — Encounter: Payer: Self-pay | Admitting: Oncology

## 2014-11-14 ENCOUNTER — Inpatient Hospital Stay: Payer: Managed Care, Other (non HMO)

## 2014-11-14 ENCOUNTER — Inpatient Hospital Stay: Payer: Managed Care, Other (non HMO) | Attending: Oncology | Admitting: Oncology

## 2014-11-14 VITALS — BP 109/80 | HR 80 | Temp 97.4°F | Wt 217.8 lb

## 2014-11-14 DIAGNOSIS — Z808 Family history of malignant neoplasm of other organs or systems: Secondary | ICD-10-CM | POA: Diagnosis not present

## 2014-11-14 DIAGNOSIS — R599 Enlarged lymph nodes, unspecified: Secondary | ICD-10-CM

## 2014-11-14 DIAGNOSIS — K469 Unspecified abdominal hernia without obstruction or gangrene: Secondary | ICD-10-CM | POA: Diagnosis not present

## 2014-11-14 DIAGNOSIS — R1900 Intra-abdominal and pelvic swelling, mass and lump, unspecified site: Secondary | ICD-10-CM | POA: Insufficient documentation

## 2014-11-14 DIAGNOSIS — I872 Venous insufficiency (chronic) (peripheral): Secondary | ICD-10-CM | POA: Insufficient documentation

## 2014-11-14 DIAGNOSIS — R590 Localized enlarged lymph nodes: Secondary | ICD-10-CM | POA: Insufficient documentation

## 2014-11-14 DIAGNOSIS — Z79899 Other long term (current) drug therapy: Secondary | ICD-10-CM | POA: Diagnosis not present

## 2014-11-14 DIAGNOSIS — K769 Liver disease, unspecified: Secondary | ICD-10-CM | POA: Diagnosis not present

## 2014-11-14 DIAGNOSIS — Z806 Family history of leukemia: Secondary | ICD-10-CM | POA: Diagnosis not present

## 2014-11-14 DIAGNOSIS — Z8042 Family history of malignant neoplasm of prostate: Secondary | ICD-10-CM | POA: Diagnosis not present

## 2014-11-14 DIAGNOSIS — J45909 Unspecified asthma, uncomplicated: Secondary | ICD-10-CM

## 2014-11-14 DIAGNOSIS — I2510 Atherosclerotic heart disease of native coronary artery without angina pectoris: Secondary | ICD-10-CM | POA: Diagnosis not present

## 2014-11-14 DIAGNOSIS — Z87891 Personal history of nicotine dependence: Secondary | ICD-10-CM | POA: Diagnosis not present

## 2014-11-14 LAB — CBC WITH DIFFERENTIAL/PLATELET
BASOS PCT: 0 %
Basophils Absolute: 0 10*3/uL (ref 0–0.1)
EOS ABS: 0.1 10*3/uL (ref 0–0.7)
EOS PCT: 3 %
HCT: 43.2 % (ref 40.0–52.0)
Hemoglobin: 14.8 g/dL (ref 13.0–18.0)
Lymphocytes Relative: 33 %
Lymphs Abs: 1.4 10*3/uL (ref 1.0–3.6)
MCH: 32 pg (ref 26.0–34.0)
MCHC: 34.2 g/dL (ref 32.0–36.0)
MCV: 93.4 fL (ref 80.0–100.0)
MONO ABS: 0.4 10*3/uL (ref 0.2–1.0)
MONOS PCT: 9 %
Neutro Abs: 2.3 10*3/uL (ref 1.4–6.5)
Neutrophils Relative %: 55 %
PLATELETS: 190 10*3/uL (ref 150–440)
RBC: 4.62 MIL/uL (ref 4.40–5.90)
RDW: 13.5 % (ref 11.5–14.5)
WBC: 4.2 10*3/uL (ref 3.8–10.6)

## 2014-11-14 LAB — COMPREHENSIVE METABOLIC PANEL
ALBUMIN: 4.2 g/dL (ref 3.5–5.0)
ALT: 22 U/L (ref 17–63)
ANION GAP: 5 (ref 5–15)
AST: 19 U/L (ref 15–41)
Alkaline Phosphatase: 64 U/L (ref 38–126)
BILIRUBIN TOTAL: 0.9 mg/dL (ref 0.3–1.2)
BUN: 15 mg/dL (ref 6–20)
CHLORIDE: 104 mmol/L (ref 101–111)
CO2: 27 mmol/L (ref 22–32)
Calcium: 8.4 mg/dL — ABNORMAL LOW (ref 8.9–10.3)
Creatinine, Ser: 1.14 mg/dL (ref 0.61–1.24)
GFR calc Af Amer: >60 mL/min (ref >60)
GFR calc non Af Amer: >60 mL/min (ref >60)
GLUCOSE: 80 mg/dL (ref 65–99)
POTASSIUM: 3.8 mmol/L (ref 3.5–5.1)
SODIUM: 136 mmol/L (ref 135–145)
TOTAL PROTEIN: 6.7 g/dL (ref 6.5–8.1)

## 2014-11-14 NOTE — Progress Notes (Signed)
Patient does not have living will.  Never smoked. Patient here as referral by Dr. Hall Busing for mesenteric mass.

## 2014-11-14 NOTE — Progress Notes (Signed)
Alan Fernandez @ [facility] [place] [phone]  [phone]  Alan Fernandez OB: [birth year]  MR#: [id number]  UYQ#:[id number]  Patient Care Team: Albina Billet, MD as PCP - General (Internal Medicine) Christene Lye, MD (General Surgery) Albina Billet, MD (Internal Medicine)  CHIEF COMPLAINT:  Chief Complaint  Patient presents with  . New Evaluation   retroperitoneal lymphadenopathy approximately 1 months duration  VISIT DIAGNOSIS:     ICD-9-CM ICD-10-CM   1. Enlarged lymph nodes 785.6 R59.1       No history exists.    No flowsheet data found.  INTERVAL HISTORY:  59 year old gentleman who started having pain a month ago in the left flank area was treated with antibiotics felt better.  A month ago had some lower abdominal discomfort treated again with antibiotics his CT scan was done which revealed retroperitoneal mass and lymphadenopathy patient was referred to me at this point in time for further evaluation  Had colonoscopy 2 years ago reported to be negative Patient had indigestion and upper abdominal discomfort in February got better after a while No chills or fever appetite is stable and has not lost any weight Patient has smoked for 35 years but quit smoking 8 years ago He works in [employer] in [employer]  Patient has chronic venous insufficiency being treated by Dr. Jamal Collin REVIEW OF SYSTEMS:   GENERAL:  Feels good.  Active.  No fevers, sweats or weight loss. PERFORMANCE STATUS (ECOG): 0 HEENT:  No visual changes, runny nose, sore throat, mouth sores or tenderness. Lungs: History of allergic asthma being treated by ventiolin  and the singular Cardiac:  No chest pain, palpitations, orthopnea, or PND. GI:  Abdominal pain as described above GU:  No urgency, frequency, dysuria, or hematuria. Musculoskeletal:  No back pain.  No joint pain.  No muscle tenderness. Extremities: Well enough lower extremity patient has chronic venous  insufficiency Skin:  No rashes or skin changes. Neuro:  No headache, numbness or weakness, balance or coordination issues. Endocrine:  No diabetes, thyroid issues, hot flashes or night sweats. Psych:  No mood changes, depression or anxiety. Pain:  No focal pain. Review of systems:  All other systems reviewed and found to be negative.  As per HPI. Otherwise, a complete review of systems is negatve.  PAST MEDICAL HISTORY: Past Medical History  Diagnosis Date  . Asthma   . Hemorrhoid   . Eczema   . Pneumonia Feb 2016    PAST SURGICAL HISTORY: Past Surgical History  Procedure Laterality Date  . Colonoscopy  2013  . Disk in neck  2003    FAMILY HISTORY Patient has strong family history of melanoma and leukemia in father and prostate cancer with uncle.     ADVANCED DIRECTIVES:  Patient does have advance healthcare directive, Patient   does not desire to make any changes  HEALTH MAINTENANCE: Social History  Substance Use Topics  . Smoking status: Never Smoker   . Smokeless tobacco: Never Used  . Alcohol Use: Yes    No Known Allergies  Current Outpatient Prescriptions  Medication Sig Dispense Refill  . clobetasol (TEMOVATE) 0.05 % external solution     . montelukast (SINGULAIR) 10 MG tablet Take 1 tablet by mouth daily.    . VENTOLIN HFA 108 (90 BASE) MCG/ACT inhaler      No current facility-administered medications for this visit.    OBJECTIVE: PHYSICAL EXAM: GENERAL:  Well developed, well nourished, sitting comfortably in the exam room  in no acute distress. MENTAL STATUS:  Alert and oriented to person, place and time. No jaundice RESPIRATORY:  Clear to auscultation without rales, wheezes or rhonchi.  Occasional wheezing CARDIOVASCULAR:  Regular rate and rhythm without murmur, rub or gallop.  ABDOMEN:  Soft, non-tender, with active bowel sounds, and no hepatosplenomegaly.  No masses. BACK:  No CVA tenderness.  No tenderness on percussion of the back or rib  cage. SKIN:  No rashes, ulcers or lesions. EXTREMITIES: No edema, no skin discoloration or tenderness.  No palpable cords. LYMPH NODES: No palpable cervical, supraclavicular, axillary or inguinal adenopathy  NEUROLOGICAL: Unremarkable. PSYCH:  Appropriate.  Filed Vitals:   11/14/14 1359  BP: 109/80  Pulse: 80  Temp: 97.4 F (36.3 C)     Body mass index is 31.25 kg/(m^2).    ECOG  0  LAB RESULTS:  No visits with results within 5 Day(s) from this visit. Latest known visit with results is:  Office Visit on 06/26/2014  Component Date Value Ref Range Status  . WBC 06/26/2014 4.3  3.4 - 10.8 x10E3/uL Final  . RBC 06/26/2014 4.40  4.14 - 5.80 x10E6/uL Final  . Hemoglobin 06/26/2014 14.2  12.6 - 17.7 g/dL Final  . Hematocrit 06/26/2014 40.1  37.5 - 51.0 % Final  . MCV 06/26/2014 91  79 - 97 fL Final  . MCH 06/26/2014 32.3  26.6 - 33.0 pg Final  . MCHC 06/26/2014 35.4  31.5 - 35.7 g/dL Final  . RDW 06/26/2014 13.1  12.3 - 15.4 % Final  . Platelets 06/26/2014 180  150 - 379 x10E3/uL Final  . Neutrophils 06/26/2014 61   Final  . Lymphs 06/26/2014 24   Final  . Monocytes 06/26/2014 14   Final  . Eos 06/26/2014 1   Final  . Basos 06/26/2014 0   Final  . Neutrophils Absolute 06/26/2014 2.7  1.4 - 7.0 x10E3/uL Final  . Lymphocytes Absolute 06/26/2014 1.0  0.7 - 3.1 x10E3/uL Final  . Monocytes Absolute 06/26/2014 0.6  0.1 - 0.9 x10E3/uL Final  . EOS (ABSOLUTE) 06/26/2014 0.0  0.0 - 0.4 x10E3/uL Final  . Basophils Absolute 06/26/2014 0.0  0.0 - 0.2 x10E3/uL Final  . Immature Granulocytes 06/26/2014 0   Final  . Immature Grans (Abs) 06/26/2014 0.0  0.0 - 0.1 x10E3/uL Final  . Glucose 06/26/2014 91  65 - 99 mg/dL Final  . BUN 06/26/2014 10  6 - 24 mg/dL Final  . Creatinine, Ser 06/26/2014 1.39* 0.76 - 1.27 mg/dL Final  . GFR calc non Af Amer 06/26/2014 55* >59 mL/min/1.73 Final  . GFR calc Af Amer 06/26/2014 64  >59 mL/min/1.73 Final  . BUN/Creatinine Ratio 06/26/2014 7* 9 - 20 Final  .  Sodium 06/26/2014 139  134 - 144 mmol/L Final  . Potassium 06/26/2014 4.2  3.5 - 5.2 mmol/L Final  . Chloride 06/26/2014 100  97 - 108 mmol/L Final  . CO2 06/26/2014 23  18 - 29 mmol/L Final  . Calcium 06/26/2014 8.9  8.7 - 10.2 mg/dL Final  . Total Protein 06/26/2014 5.9* 6.0 - 8.5 g/dL Final  . Albumin 06/26/2014 4.1  3.5 - 5.5 g/dL Final  . Globulin, Total 06/26/2014 1.8  1.5 - 4.5 g/dL Final  . Albumin/Globulin Ratio 06/26/2014 2.3  1.1 - 2.5 Final  . Bilirubin Total 06/26/2014 0.8  0.0 - 1.2 mg/dL Final  . Alkaline Phosphatase 06/26/2014 62  39 - 117 IU/L Final  . AST 06/26/2014 21  0 - 40 IU/L Final  .  ALT 06/26/2014 19  0 - 44 IU/L Final     STUDIES: Ct Abdomen Pelvis W Contrast  11/08/2014   CLINICAL DATA:  Left buttock pain. Abdominal discomfort in left lower quadrant pain since 09/24/2014. No known injury.  EXAM: CT ABDOMEN AND PELVIS WITH CONTRAST  TECHNIQUE: Multidetector CT imaging of the abdomen and pelvis was performed using the standard protocol following bolus administration of intravenous contrast.  CONTRAST:  177mL OMNIPAQUE IOHEXOL 300 MG/ML  SOLN  COMPARISON:  None.  FINDINGS: Lower chest: Coronary artery calcifications are present. No pericardial effusion. Lung bases are negative. Small fat containing Bochdalek's hernia is identified at the right lung base.  Upper abdomen: There is a small circumscribed lesion within the left hepatic lobe measuring 18 mm and consistent with a small cyst. There are 2 linear areas of low attenuation within the spleen, nonspecific in appearance. No focal abnormality identified within the adrenal glands or pancreas. The gallbladder is present. There is symmetric bilateral renal excretion. No hydronephrosis or ureteral stones.  Gastrointestinal tract: The stomach and small bowel loops are normal in appearance. The appendix is well seen and has a normal appearance. Colonic loops are normal in appearance.  Pelvis: Urinary bladder, prostate gland, and  seminal vesicles have a normal appearance. There is no free pelvic fluid. No pelvic adenopathy.  Retroperitoneum and mesentery: Within the right upper mid abdominal mesentery there is a soft tissue mass measuring 3.8 x 2.2 cm. Two smaller masses are identified in the right lower quadrant, likely representing small adjacent lymph nodes measuring 2.1 and 1.4 cm. There are no associated calcifications, mesenteric tethering, or bowel obstruction. A single aortocaval lymph node measures 9 mm. A right retrocrural lymph node is 12 mm.  The aorta is calcified but not aneurysmal.  Abdominal wall: Unremarkable.  Osseous structures: No suspicious lytic or blastic lesions are identified.  IMPRESSION: 1. 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 There are 2 adjacent small lymph nodes. Considerations include lymphoma, carcinoid, metastasis, gastrointestinal stromal cell tumor, and infection/ inflammation. There is minimal retroperitoneal adenopathy. 2. No associated bowel mass or bowel obstruction. 3. Coronary artery disease. 4. Small Bochdalek's hernia. 5. Small liver lesion favored to be benign. 6. Nonspecific linear low-attenuation areas within the spleen, likely benign. 7. Normal appendix. 8. These results will be called to the ordering clinician or representative by the Radiologist Assistant, and communication documented in the PACS or zVision Dashboard.   Electronically Signed   By: Nolon Nations M.D.   On: 11/08/2014 10:32    ASSESSMENT: retroperitoneal adenopathy and mass in the mesentery   CT scan has been reviewed independently Lab data from Dr. Sharlet Salina Tate's office has been reviewed independently I discussed case with Dr. Benita Stabile and radiologist today Case was also discussed in tumor conference  PLAN:    CBC comprehensive metabolic panel and chronic lymphocytic leukemia profile   PET scan to decide if there are any easily biopsy able lymphadenopathy A PET scan is not approved and then CT scan of  chest. Discussing situation with radiologist Dr. Vernard Gambles, if PET scan is positive in retroperitoneal lymph node that M is entry mass which is 3.8 cm can be biopsied.  Discussed situation with pathologist that sometimes diagnosis of lymphoma is difficult on needle biopsy but if there is no other option than initial biopsy would be planned.  Discussed that with the patient and family.  Also reviewed PET scan with the patient   Patient expressed understanding and was in agreement with this plan.  He also understands that He can call clinic at any time with any questions, concerns, or complaints.    No matching staging information was found for the patient.  Alan Gleason, MD   11/14/2014 2:13 PM

## 2014-11-19 ENCOUNTER — Ambulatory Visit
Admission: RE | Admit: 2014-11-19 | Discharge: 2014-11-19 | Disposition: A | Discharge status: Home / Self Care | Payer: Managed Care, Other (non HMO) | Source: Ambulatory Visit | Attending: Oncology | Admitting: Oncology

## 2014-11-19 DIAGNOSIS — R1900 Intra-abdominal and pelvic swelling, mass and lump, unspecified site: Secondary | ICD-10-CM | POA: Diagnosis not present

## 2014-11-19 DIAGNOSIS — R591 Generalized enlarged lymph nodes: Secondary | ICD-10-CM | POA: Insufficient documentation

## 2014-11-19 DIAGNOSIS — R599 Enlarged lymph nodes, unspecified: Secondary | ICD-10-CM

## 2014-11-19 LAB — COMP PANEL: LEUKEMIA/LYMPHOMA

## 2014-11-19 LAB — GLUCOSE, CAPILLARY: Glucose-Capillary: 98 mg/dL (ref 65–99)

## 2014-11-19 MED ORDER — FLUDEOXYGLUCOSE F - 18 (FDG) INJECTION
12.0000 | Freq: Once | INTRAVENOUS | Status: DC | PRN
  Administered 2014-11-19: 12.4 via INTRAVENOUS
  Filled 2014-11-19: qty 12

## 2014-11-21 ENCOUNTER — Inpatient Hospital Stay: Payer: Managed Care, Other (non HMO) | Admitting: Oncology

## 2014-11-27 ENCOUNTER — Inpatient Hospital Stay: Payer: Managed Care, Other (non HMO) | Attending: Oncology | Admitting: Oncology

## 2014-11-27 VITALS — BP 150/84 | HR 79 | Temp 97.2°F | Wt 222.0 lb

## 2014-11-27 DIAGNOSIS — K469 Unspecified abdominal hernia without obstruction or gangrene: Secondary | ICD-10-CM | POA: Diagnosis not present

## 2014-11-27 DIAGNOSIS — I2510 Atherosclerotic heart disease of native coronary artery without angina pectoris: Secondary | ICD-10-CM | POA: Insufficient documentation

## 2014-11-27 DIAGNOSIS — Z8701 Personal history of pneumonia (recurrent): Secondary | ICD-10-CM | POA: Insufficient documentation

## 2014-11-27 DIAGNOSIS — K769 Liver disease, unspecified: Secondary | ICD-10-CM | POA: Insufficient documentation

## 2014-11-27 DIAGNOSIS — R1900 Intra-abdominal and pelvic swelling, mass and lump, unspecified site: Secondary | ICD-10-CM | POA: Insufficient documentation

## 2014-11-27 DIAGNOSIS — Z806 Family history of leukemia: Secondary | ICD-10-CM | POA: Diagnosis not present

## 2014-11-27 DIAGNOSIS — I872 Venous insufficiency (chronic) (peripheral): Secondary | ICD-10-CM | POA: Insufficient documentation

## 2014-11-27 DIAGNOSIS — Z87891 Personal history of nicotine dependence: Secondary | ICD-10-CM | POA: Insufficient documentation

## 2014-11-27 DIAGNOSIS — R590 Localized enlarged lymph nodes: Secondary | ICD-10-CM | POA: Insufficient documentation

## 2014-11-27 DIAGNOSIS — Z808 Family history of malignant neoplasm of other organs or systems: Secondary | ICD-10-CM | POA: Diagnosis not present

## 2014-11-27 DIAGNOSIS — R109 Unspecified abdominal pain: Secondary | ICD-10-CM | POA: Insufficient documentation

## 2014-11-27 DIAGNOSIS — J45909 Unspecified asthma, uncomplicated: Secondary | ICD-10-CM | POA: Insufficient documentation

## 2014-11-27 DIAGNOSIS — Z79899 Other long term (current) drug therapy: Secondary | ICD-10-CM | POA: Insufficient documentation

## 2014-11-27 DIAGNOSIS — Z8042 Family history of malignant neoplasm of prostate: Secondary | ICD-10-CM | POA: Diagnosis not present

## 2014-11-27 DIAGNOSIS — D739 Disease of spleen, unspecified: Secondary | ICD-10-CM | POA: Diagnosis not present

## 2014-11-27 DIAGNOSIS — R599 Enlarged lymph nodes, unspecified: Secondary | ICD-10-CM

## 2014-11-27 NOTE — Progress Notes (Signed)
Patient does not have living will.  Former smoker. 

## 2014-11-30 ENCOUNTER — Encounter: Payer: Self-pay | Admitting: Oncology

## 2014-11-30 NOTE — Progress Notes (Signed)
[place] @ [facility] Telephone:(336) [phone]  Fax:(336) [phone]  Progress note  Alan Fernandez OB: 1955/11/07  MR#: [id number]  IRJ#:[id number]  Patient Care Team: Albina Billet, MD as PCP - General (Internal Medicine) Christene Lye, MD (General Surgery) Albina Billet, MD (Internal Medicine)  CHIEF COMPLAINT:  Chief Complaint  Patient presents with  . OTHER   retroperitoneal lymphadenopathy approximately 1 months duration  VISIT DIAGNOSIS:   retroperitoneal lymphadenopathy       INTERVAL HISTORY:  59 year old gentleman who started having pain a month ago in the left flank area was treated with antibiotics felt better.  A month ago had some lower abdominal discomfort treated again with antibiotics his CT scan was done which revealed retroperitoneal mass and lymphadenopathy patient was referred to me at this point in time for further evaluation  Had colonoscopy 2 years ago reported to be negative Patient had indigestion and upper abdominal discomfort in February got better after a while No chills or fever appetite is stable and has not lost any weight Patient has smoked for 35 years but quit smoking 8 years ago He works in Conservator, museum/gallery in [employer] .  November 27, 2014 Patient is here for ongoing evaluation and treatment consideration.  Patient had a PET scan done which shows hypermetabolic lesion of retroperitoneal lymph nodes as well as mesenteric mass.  Abdominal pain has improved no chills and fever   Patient has chronic venous insufficiency being treated by Dr. Jamal Collin REVIEW OF SYSTEMS:   GENERAL:  Feels good.  Active.  No fevers, sweats or weight loss. PERFORMANCE STATUS (ECOG): 0 HEENT:  No visual changes, runny nose, sore throat, mouth sores or tenderness. Lungs: History of allergic asthma being treated by ventiolin  and the singular Cardiac:  No chest pain, palpitations, orthopnea, or PND. GI:  Abdominal pain as described above GU:  No urgency,  frequency, dysuria, or hematuria. Musculoskeletal:  No back pain.  No joint pain.  No muscle tenderness. Extremities: Well enough lower extremity patient has chronic venous insufficiency Skin:  No rashes or skin changes. Neuro:  No headache, numbness or weakness, balance or coordination issues. Endocrine:  No diabetes, thyroid issues, hot flashes or night sweats. Psych:  No mood changes, depression or anxiety. Pain:  No focal pain. Review of systems:  All other systems reviewed and found to be negative.  As per HPI. Otherwise, a complete review of systems is negatve.  PAST MEDICAL HISTORY: Past Medical History  Diagnosis Date  . Asthma   . Hemorrhoid   . Eczema   . Pneumonia Feb 2016    PAST SURGICAL HISTORY: Past Surgical History  Procedure Laterality Date  . Colonoscopy  2013  . Disk in neck  2003    FAMILY HISTORY Patient has strong family history of melanoma and leukemia in father and prostate cancer with uncle.     ADVANCED DIRECTIVES:  Patient does have advance healthcare directive, Patient   does not desire to make any changes  HEALTH MAINTENANCE: Social History  Substance Use Topics  . Smoking status: Never Smoker   . Smokeless tobacco: Never Used  . Alcohol Use: Yes    No Known Allergies  Current Outpatient Prescriptions  Medication Sig Dispense Refill  . clobetasol (TEMOVATE) 0.05 % external solution     . montelukast (SINGULAIR) 10 MG tablet Take 1 tablet by mouth daily.    . VENTOLIN HFA 108 (90 BASE) MCG/ACT inhaler      No current facility-administered medications  for this visit.    OBJECTIVE: PHYSICAL EXAM: GENERAL:  Well developed, well nourished, sitting comfortably in the exam room in no acute distress. MENTAL STATUS:  Alert and oriented to person, place and time. No jaundice RESPIRATORY:  Clear to auscultation without rales, wheezes or rhonchi.  Occasional wheezing CARDIOVASCULAR:  Regular rate and rhythm without murmur, rub or  gallop.  ABDOMEN:  Soft, non-tender, with active bowel sounds, and no hepatosplenomegaly.  No masses. BACK:  No CVA tenderness.  No tenderness on percussion of the back or rib cage. SKIN:  No rashes, ulcers or lesions. EXTREMITIES: No edema, no skin discoloration or tenderness.  No palpable cords. LYMPH NODES: No palpable cervical, supraclavicular, axillary or inguinal adenopathy  NEUROLOGICAL: Unremarkable. PSYCH:  Appropriate.  Filed Vitals:   11/27/14 1434  BP: 150/84  Pulse: 79  Temp: 97.2 F (36.2 C)     Body mass index is 31.85 kg/(m^2).    ECOG  0  LAB RESULTS:  No visits with results within 5 Day(s) from this visit. Latest known visit with results is:  Hospital Outpatient Visit on 11/19/2014  Component Date Value Ref Range Status  . Glucose-Capillary 11/19/2014 98  65 - 99 mg/dL Final     STUDIES: Ct Abdomen Pelvis W Contrast  11/08/2014   CLINICAL DATA:  Left buttock pain. Abdominal discomfort in left lower quadrant pain since 09/24/2014. No known injury.  EXAM: CT ABDOMEN AND PELVIS WITH CONTRAST  TECHNIQUE: Multidetector CT imaging of the abdomen and pelvis was performed using the standard protocol following bolus administration of intravenous contrast.  CONTRAST:  111mL OMNIPAQUE IOHEXOL 300 MG/ML  SOLN  COMPARISON:  None.  FINDINGS: Lower chest: Coronary artery calcifications are present. No pericardial effusion. Lung bases are negative. Small fat containing Bochdalek's hernia is identified at the right lung base.  Upper abdomen: There is a small circumscribed lesion within the left hepatic lobe measuring 18 mm and consistent with a small cyst. There are 2 linear areas of low attenuation within the spleen, nonspecific in appearance. No focal abnormality identified within the adrenal glands or pancreas. The gallbladder is present. There is symmetric bilateral renal excretion. No hydronephrosis or ureteral stones.  Gastrointestinal tract: The stomach and small bowel loops  are normal in appearance. The appendix is well seen and has a normal appearance. Colonic loops are normal in appearance.  Pelvis: Urinary bladder, prostate gland, and seminal vesicles have a normal appearance. There is no free pelvic fluid. No pelvic adenopathy.  Retroperitoneum and mesentery: Within the right upper mid abdominal mesentery there is a soft tissue mass measuring 3.8 x 2.2 cm. Two smaller masses are identified in the right lower quadrant, likely representing small adjacent lymph nodes measuring 2.1 and 1.4 cm. There are no associated calcifications, mesenteric tethering, or bowel obstruction. A single aortocaval lymph node measures 9 mm. A right retrocrural lymph node is 12 mm.  The aorta is calcified but not aneurysmal.  Abdominal wall: Unremarkable.  Osseous structures: No suspicious lytic or blastic lesions are identified.  IMPRESSION: 1. 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 There are 2 adjacent small lymph nodes. Considerations include lymphoma, carcinoid, metastasis, gastrointestinal stromal cell tumor, and infection/ inflammation. There is minimal retroperitoneal adenopathy. 2. No associated bowel mass or bowel obstruction. 3. Coronary artery disease. 4. Small Bochdalek's hernia. 5. Small liver lesion favored to be benign. 6. Nonspecific linear low-attenuation areas within the spleen, likely benign. 7. Normal appendix. 8. These results will be called to the ordering clinician or representative by  the Radiologist Assistant, and communication documented in the PACS or zVision Dashboard.   Electronically Signed   By: Nolon Nations M.D.   On: 11/08/2014 10:32   Nm Pet Image Initial (pi) Skull Base To Thigh  11/19/2014   CLINICAL DATA:  Initial treatment strategy for lymphadenopathy. Retroperitoneal mass.  EXAM: NUCLEAR MEDICINE PET SKULL BASE TO THIGH  TECHNIQUE: 12.4 mCi F-18 FDG was injected intravenously. Full-ring PET imaging was performed from the skull base to thigh after the radiotracer. CT  data was obtained and used for attenuation correction and anatomic localization.  FASTING BLOOD GLUCOSE:  Value: 98 mg/dl  COMPARISON:  CT 11/08/2014  FINDINGS: NECK  No hypermetabolic lymph nodes in the neck.  CHEST  No hypermetabolic mediastinal or hilar nodes. No suspicious pulmonary nodules on the CT scan.  ABDOMEN/PELVIS  The mesenteric mass in the central mid abdomen measuring 20 mm x 38 mm on image 22, series 3 has intense metabolic activity with SUV max equal 11.2. Small adjacent metabolic nodes in the ileocecal mesentery are hypermetabolic. For example 13 mm short axis node on image 189 SUV max 9.3.  Several intensely hypermetabolic retroperitoneal nodes. Retrocrural node on image 140 with SUV max 15.6. Small node posterior to the RIGHT renal vein on image 160 measures 8 mm short axis.  No Hypermetabolic iliac or inguinal lymphadenopathy  No abnormal metabolic activity the spleen.  Liver is normal  Symmetric metabolic activity with cystic testicles without evidence of enlargement is likely physiologic.  SKELETON  No focal hypermetabolic activity to suggest skeletal metastasis.  IMPRESSION: 1. Hypermetabolic central mesenteric mass and several adjacent metabolic lymph nodes in the ileocecal mesentery are most consistent with high-grade lymphoma. 2. Hypermetabolic retroperitoneal and retrocrural lymph nodes consistent with lymphoma. 3. No hypermetabolic iliac lymph nodes or inguinal lymph nodes. No hypermetabolic axillary or supraclavicular lymph nodes. Lymph node deep to the RIGHT renal vein/IVC may be most assessable for biopsy although small. 4. Normal marrow bone marrow and spleen.   Electronically Signed   By: Suzy Bouchard M.D.   On: 11/19/2014 15:49    ASSESSMENT: retroperitoneal adenopathy and mass in the mesentery   CT scan has been reviewed independently Lab data from Dr. Charyl Bigger office has been reviewed independently I discussed case with Dr. Benita Stabile and radiologist today Case was  also discussed in tumor conference  PLAN:    PET scan has been reviewed independently and reviewed with the patient. Case was discussed in tumor conference Our options would include biopsy under CT guided approach versus laparoscopy examination Radiologist was not sure whether they can approach this area or not patient would be referred to Dr. Jamal Collin  for evaluation I will discuss situation with Dr. Jamal Collin  Total duration of visit was 30 minutes.  50% or more time was spent in counseling patient and family regarding prognosis and options of treatment and available resources Patient expressed understanding and was in agreement with this plan. He also understands that He can call clinic at any time with any questions, concerns, or complaints.    No matching staging information was found for the patient.  Forest Gleason, MD   11/30/2014 9:28 AM

## 2014-12-05 ENCOUNTER — Encounter: Payer: Self-pay | Admitting: General Surgery

## 2014-12-05 ENCOUNTER — Ambulatory Visit (INDEPENDENT_AMBULATORY_CARE_PROVIDER_SITE_OTHER): Payer: Managed Care, Other (non HMO) | Admitting: General Surgery

## 2014-12-05 VITALS — BP 142/64 | HR 84 | Resp 14 | Ht 70.0 in | Wt 221.0 lb

## 2014-12-05 DIAGNOSIS — K6389 Other specified diseases of intestine: Secondary | ICD-10-CM

## 2014-12-05 DIAGNOSIS — K668 Other specified disorders of peritoneum: Secondary | ICD-10-CM | POA: Diagnosis not present

## 2014-12-05 NOTE — Progress Notes (Signed)
Patient ID: Alan Fernandez, male   DOB: February 25, 1955, 59 y.o.   MRN: [id number]  Chief Complaint  Patient presents with  . Other    abnormal Pet scan    HPI Alan Fernandez is a 59 y.o. male here today to discuss his Pet scan that was done on 11/19/14. He started having pain on his left side LUQ/thoracic and was treated with antibiotics for diverticulitis. The pain went away and and later returned LLQ. He thus had a CT and another antibiotic. He has overall abdominal discomfort. He has not had regular bowel movements since January. The two episodes of abdominal pain have resolved. He reports getting short of breath more easily than he used to. He denies night sweats, fever, chills, malaise, loss of weight or appetite.  HPI  Past Medical History  Diagnosis Date  . Asthma   . Hemorrhoid   . Eczema   . Pneumonia Feb 2016    Past Surgical History  Procedure Laterality Date  . Colonoscopy  2013  . Disk in neck  2003    History reviewed. No pertinent family history.  Social History Social History  Substance Use Topics  . Smoking status: Never Smoker   . Smokeless tobacco: Never Used  . Alcohol Use: Yes    No Known Allergies  Current Outpatient Prescriptions  Medication Sig Dispense Refill  . clobetasol (TEMOVATE) 0.05 % external solution     . montelukast (SINGULAIR) 10 MG tablet Take 1 tablet by mouth daily.    . VENTOLIN HFA 108 (90 BASE) MCG/ACT inhaler      No current facility-administered medications for this visit.    Review of Systems Review of Systems  Constitutional: Negative.   Respiratory: Positive for shortness of breath.   Cardiovascular: Negative.     Blood pressure 142/64, pulse 84, resp. rate 14, height 5\' 10"  (1.778 m), weight 221 lb (100.245 kg).  Physical Exam Physical Exam  Constitutional: He is oriented to person, place, and time. He appears well-developed and well-nourished.  Eyes: Conjunctivae are normal. No scleral icterus.  Neck: Neck  supple.  Cardiovascular: Normal rate, regular rhythm and normal heart sounds.   Pulmonary/Chest: Effort normal and breath sounds normal.  Abdominal: Soft. Bowel sounds are normal. There is no hepatomegaly. No hernia.  Lymphadenopathy:    He has no cervical adenopathy.    He has no axillary adenopathy.       Right: No inguinal adenopathy present.       Left: No inguinal adenopathy present.  Neurological: He is alert and oriented to person, place, and time.  Skin: Skin is warm and dry.    Data Reviewed CT and PET scan showing 2 sizeable mesenteric masses PET positive.   Assessment    Oncology evaluation and CT evaluation suggest possibility of lymphoma. Discussed laparoscopy and biopsy.    Plan    Laparoscopic biopsy. Procedure explained in full including risks and benefits.    Patient's surgery has been scheduled for 12-09-14 at [facility].  PCP:  Wyatt Mage G 12/05/2014, 3:07 PM

## 2014-12-05 NOTE — Patient Instructions (Addendum)
Diagnostic Laparoscopy A diagnostic laparoscopy is a procedure to diagnose diseases in the abdomen. During the procedure, a thin, lighted, pencil-sized instrument called a laparoscope is inserted into the abdomen through an incision. The laparoscope allows your health care provider to look at the organs inside your body. LET [facility] CARE PROVIDER KNOW ABOUT:  Any allergies you have.  All medicines you are taking, including vitamins, herbs, eye drops, creams, and over-the-counter medicines.  Previous problems you or members of your family have had with the use of anesthetics.  Any blood disorders you have.  Previous surgeries you have had.  Medical conditions you have. RISKS AND COMPLICATIONS  Generally, this is a safe procedure. However, problems can occur, which may include:  Infection.  Bleeding.  Damage to other organs.  Allergic reaction to the anesthetics used during the procedure. BEFORE THE PROCEDURE  Do not eat or drink anything after midnight on the night before the procedure or as directed by your health care provider.  Ask your health care provider about:  Changing or stopping your regular medicines.  Taking medicines such as aspirin and ibuprofen. These medicines can thin your blood. Do not take these medicines before your procedure if your health care provider instructs you not to.  Plan to have someone take you home after the procedure. PROCEDURE  You may be given a medicine to help you relax (sedative).  You will be given a medicine to make you sleep (general anesthetic).  Your abdomen will be inflated with a gas. This will make your organs easier to see.  Small incisions will be made in your abdomen.  A laparoscope and other small instruments will be inserted into the abdomen through the incisions.  A tissue sample may be removed from an organ in the abdomen for examination.  The instruments will be removed from the abdomen.  The gas will be  released.  The incisions will be closed with stitches (sutures). AFTER THE PROCEDURE  Your blood pressure, heart rate, breathing rate, and blood oxygen level will be monitored often until the medicines you were given have worn off.   This information is not intended to replace advice given to you by your health care provider. Make sure you discuss any questions you have with your health care provider.   Document Released: 05/17/2000 Document Revised: 10/30/2014 Document Reviewed: 09/21/2013 Elsevier Interactive Patient Education 2016 [place].  Patient's surgery has been scheduled for 12-09-14 at [facility].

## 2014-12-06 ENCOUNTER — Encounter
Admission: RE | Admit: 2014-12-06 | Discharge: 2014-12-06 | Disposition: A | Discharge status: Home / Self Care | Payer: Managed Care, Other (non HMO) | Source: Ambulatory Visit | Attending: General Surgery | Admitting: General Surgery

## 2014-12-06 DIAGNOSIS — J45909 Unspecified asthma, uncomplicated: Secondary | ICD-10-CM | POA: Diagnosis not present

## 2014-12-06 DIAGNOSIS — R948 Abnormal results of function studies of other organs and systems: Secondary | ICD-10-CM | POA: Diagnosis not present

## 2014-12-06 DIAGNOSIS — R109 Unspecified abdominal pain: Secondary | ICD-10-CM | POA: Diagnosis present

## 2014-12-06 DIAGNOSIS — Z7952 Long term (current) use of systemic steroids: Secondary | ICD-10-CM | POA: Diagnosis not present

## 2014-12-06 DIAGNOSIS — K598 Other specified functional intestinal disorders: Secondary | ICD-10-CM | POA: Diagnosis not present

## 2014-12-06 DIAGNOSIS — E669 Obesity, unspecified: Secondary | ICD-10-CM | POA: Diagnosis not present

## 2014-12-06 DIAGNOSIS — L309 Dermatitis, unspecified: Secondary | ICD-10-CM | POA: Diagnosis not present

## 2014-12-06 DIAGNOSIS — Z6830 Body mass index (BMI) 30.0-30.9, adult: Secondary | ICD-10-CM | POA: Diagnosis not present

## 2014-12-06 DIAGNOSIS — Z87891 Personal history of nicotine dependence: Secondary | ICD-10-CM | POA: Diagnosis not present

## 2014-12-06 DIAGNOSIS — Z79899 Other long term (current) drug therapy: Secondary | ICD-10-CM | POA: Diagnosis not present

## 2014-12-06 NOTE — Patient Instructions (Signed)
  Your procedure is scheduled on: Monday December 09, 2014 at 8:00 am. Report to Same Day Surgery.   Remember: Instructions that are not followed completely may result in serious medical risk, up to and including death, or upon the discretion of your surgeon and anesthesiologist your surgery may need to be rescheduled.    _x___ 1. Do not eat food or drink liquids after midnight. No gum chewing or hard candies.     _x___ 2. No Alcohol for 24 hours before or after surgery.   ____ 3. Bring all medications with you on the day of surgery if instructed.    __x__ 4. Notify your doctor if there is any change in your medical condition     (cold, fever, infections).     Do not wear jewelry, make-up, hairpins, clips or nail polish.  Do not wear lotions, powders, or perfumes. You may wear deodorant.  Do not shave 48 hours prior to surgery. Men may shave face and neck.  Do not bring valuables to the hospital.    [facility] is not responsible for any belongings or valuables.               Contacts, dentures or bridgework may not be worn into surgery.  Leave your suitcase in the car. After surgery it may be brought to your room.  For patients admitted to the hospital, discharge time is determined by your treatment team.   Patients discharged the day of surgery will not be allowed to drive home.    Please read over the following fact sheets that you were given:   [facility] Preparing for Surgery  ____ Take these medicines the morning of surgery with A SIP OF WATER: None     ____ Fleet Enema (as directed)   ____ Use CHG Soap as directed  _x___ Use inhalers on the day of surgery & bring inhaler to hospital that am.  ____ Stop metformin 2 days prior to surgery    ____ Take 1/2 of usual insulin dose the night before surgery and none on the morning of  surgery.   ____ Stop Coumadin/Plavix/aspirin on dose not apply.  __x_ Stop Anti-inflammatories now, tylenol can be taken for pain.   ____  Stop supplements until after surgery.    ____ Bring C-Pap to the hospital.

## 2014-12-09 ENCOUNTER — Ambulatory Visit
Admission: RE | Admit: 2014-12-09 | Discharge: 2014-12-09 | Disposition: A | Discharge status: Home / Self Care | Payer: Managed Care, Other (non HMO) | Source: Ambulatory Visit | Attending: General Surgery | Admitting: General Surgery

## 2014-12-09 ENCOUNTER — Encounter: Payer: Self-pay | Admitting: *Deleted

## 2014-12-09 ENCOUNTER — Encounter
Admission: RE | Disposition: A | Discharge status: Home / Self Care | Payer: Self-pay | Source: Ambulatory Visit | Attending: General Surgery

## 2014-12-09 ENCOUNTER — Ambulatory Visit: Payer: Managed Care, Other (non HMO) | Admitting: *Deleted

## 2014-12-09 DIAGNOSIS — Z6830 Body mass index (BMI) 30.0-30.9, adult: Secondary | ICD-10-CM | POA: Insufficient documentation

## 2014-12-09 DIAGNOSIS — K6389 Other specified diseases of intestine: Secondary | ICD-10-CM

## 2014-12-09 DIAGNOSIS — K598 Other specified functional intestinal disorders: Secondary | ICD-10-CM | POA: Insufficient documentation

## 2014-12-09 DIAGNOSIS — R109 Unspecified abdominal pain: Secondary | ICD-10-CM | POA: Diagnosis not present

## 2014-12-09 DIAGNOSIS — E669 Obesity, unspecified: Secondary | ICD-10-CM | POA: Insufficient documentation

## 2014-12-09 DIAGNOSIS — J45909 Unspecified asthma, uncomplicated: Secondary | ICD-10-CM | POA: Insufficient documentation

## 2014-12-09 DIAGNOSIS — R948 Abnormal results of function studies of other organs and systems: Secondary | ICD-10-CM | POA: Insufficient documentation

## 2014-12-09 DIAGNOSIS — R1901 Right upper quadrant abdominal swelling, mass and lump: Secondary | ICD-10-CM | POA: Diagnosis not present

## 2014-12-09 DIAGNOSIS — L309 Dermatitis, unspecified: Secondary | ICD-10-CM | POA: Insufficient documentation

## 2014-12-09 DIAGNOSIS — Z7952 Long term (current) use of systemic steroids: Secondary | ICD-10-CM | POA: Insufficient documentation

## 2014-12-09 DIAGNOSIS — Z87891 Personal history of nicotine dependence: Secondary | ICD-10-CM | POA: Insufficient documentation

## 2014-12-09 DIAGNOSIS — Z79899 Other long term (current) drug therapy: Secondary | ICD-10-CM | POA: Insufficient documentation

## 2014-12-09 HISTORY — PX: LAPAROSCOPY: SHX197

## 2014-12-09 SURGERY — LAPAROSCOPY, DIAGNOSTIC
Anesthesia: General | Wound class: Clean

## 2014-12-09 MED ORDER — FAMOTIDINE 20 MG PO TABS
20.0000 mg | ORAL_TABLET | Freq: Once | ORAL | Status: AC
  Administered 2014-12-09: 20 mg via ORAL

## 2014-12-09 MED ORDER — HYDROMORPHONE HCL 1 MG/ML IJ SOLN
INTRAMUSCULAR | Status: AC
  Administered 2014-12-09: 0.5 mg via INTRAVENOUS
  Filled 2014-12-09: qty 1

## 2014-12-09 MED ORDER — CEFAZOLIN SODIUM-DEXTROSE 2-3 GM-% IV SOLR
2.0000 g | INTRAVENOUS | Status: AC
  Administered 2014-12-09: 2 g via INTRAVENOUS

## 2014-12-09 MED ORDER — LACTATED RINGERS IV SOLN
INTRAVENOUS | Status: DC
  Administered 2014-12-09 (×2): via INTRAVENOUS

## 2014-12-09 MED ORDER — CEFAZOLIN SODIUM-DEXTROSE 2-3 GM-% IV SOLR
INTRAVENOUS | Status: AC
  Administered 2014-12-09: 2 g via INTRAVENOUS
  Filled 2014-12-09: qty 50

## 2014-12-09 MED ORDER — OXYCODONE-ACETAMINOPHEN 5-325 MG PO TABS: 1.0000 | ORAL_TABLET | ORAL | Status: DC | PRN

## 2014-12-09 MED ORDER — OXYCODONE-ACETAMINOPHEN 5-325 MG PO TABS
1.0000 | ORAL_TABLET | ORAL | Status: DC | PRN
  Administered 2014-12-09: 1 via ORAL

## 2014-12-09 MED ORDER — SUGAMMADEX SODIUM 200 MG/2ML IV SOLN
INTRAVENOUS | Status: DC | PRN
  Administered 2014-12-09: 200 mg via INTRAVENOUS

## 2014-12-09 MED ORDER — PROPOFOL 10 MG/ML IV BOLUS
INTRAVENOUS | Status: DC | PRN
  Administered 2014-12-09: 200 mg via INTRAVENOUS

## 2014-12-09 MED ORDER — KETOROLAC TROMETHAMINE 30 MG/ML IJ SOLN
INTRAMUSCULAR | Status: DC | PRN
  Administered 2014-12-09: 30 mg via INTRAVENOUS

## 2014-12-09 MED ORDER — ACETAMINOPHEN 10 MG/ML IV SOLN
INTRAVENOUS | Status: DC | PRN
  Administered 2014-12-09: 1000 mg via INTRAVENOUS

## 2014-12-09 MED ORDER — DEXAMETHASONE SODIUM PHOSPHATE 4 MG/ML IJ SOLN
INTRAMUSCULAR | Status: DC | PRN
  Administered 2014-12-09: 5 mg via INTRAVENOUS

## 2014-12-09 MED ORDER — FAMOTIDINE 20 MG PO TABS
ORAL_TABLET | ORAL | Status: AC
  Administered 2014-12-09: 20 mg via ORAL
  Filled 2014-12-09: qty 1

## 2014-12-09 MED ORDER — OXYCODONE-ACETAMINOPHEN 5-325 MG PO TABS
ORAL_TABLET | ORAL | Status: AC
  Filled 2014-12-09: qty 1

## 2014-12-09 MED ORDER — ONDANSETRON HCL 4 MG/2ML IJ SOLN
INTRAMUSCULAR | Status: DC | PRN
  Administered 2014-12-09: 4 mg via INTRAVENOUS

## 2014-12-09 MED ORDER — ACETAMINOPHEN 10 MG/ML IV SOLN
INTRAVENOUS | Status: AC
  Filled 2014-12-09: qty 100

## 2014-12-09 MED ORDER — ONDANSETRON HCL 4 MG/2ML IJ SOLN: 4.0000 mg | Freq: Once | INTRAMUSCULAR | Status: DC | PRN

## 2014-12-09 MED ORDER — LIDOCAINE HCL (CARDIAC) 20 MG/ML IV SOLN
INTRAVENOUS | Status: DC | PRN
Start: 2014-12-09 — End: 2014-12-09
  Administered 2014-12-09: 100 mg via INTRAVENOUS

## 2014-12-09 MED ORDER — SUCCINYLCHOLINE CHLORIDE 20 MG/ML IJ SOLN
INTRAMUSCULAR | Status: DC | PRN
  Administered 2014-12-09: 100 mg via INTRAVENOUS

## 2014-12-09 MED ORDER — CHLORHEXIDINE GLUCONATE 4 % EX LIQD
1.0000 | Freq: Once | CUTANEOUS | Status: DC
Start: 2014-12-10 — End: 2014-12-09

## 2014-12-09 MED ORDER — METOPROLOL TARTRATE 1 MG/ML IV SOLN
INTRAVENOUS | Status: DC | PRN
  Administered 2014-12-09: 5 mg via INTRAVENOUS

## 2014-12-09 MED ORDER — FENTANYL CITRATE (PF) 100 MCG/2ML IJ SOLN
INTRAMUSCULAR | Status: DC | PRN
  Administered 2014-12-09: 50 ug via INTRAVENOUS
  Administered 2014-12-09: 100 ug via INTRAVENOUS
  Administered 2014-12-09: 50 ug via INTRAVENOUS

## 2014-12-09 MED ORDER — MIDAZOLAM HCL 2 MG/2ML IJ SOLN
INTRAMUSCULAR | Status: DC | PRN
  Administered 2014-12-09: 2 mg via INTRAVENOUS

## 2014-12-09 MED ORDER — HYDROMORPHONE HCL 1 MG/ML IJ SOLN
0.2500 mg | INTRAMUSCULAR | Status: DC | PRN
  Administered 2014-12-09 (×4): 0.5 mg via INTRAVENOUS

## 2014-12-09 MED ORDER — ROCURONIUM BROMIDE 100 MG/10ML IV SOLN
INTRAVENOUS | Status: DC | PRN
  Administered 2014-12-09 (×3): 10 mg via INTRAVENOUS
  Administered 2014-12-09: 20 mg via INTRAVENOUS

## 2014-12-09 SURGICAL SUPPLY — 57 items
BLADE CLIPPER SURG (BLADE) ×3 IMPLANT
BLADE SURG 10 STRL SS SAFETY (BLADE) IMPLANT
BLADE SURG 11 STRL SS SAFETY (MISCELLANEOUS) IMPLANT
BLADE SURG 15 STRL SS SAFETY (BLADE) ×3 IMPLANT
CANISTER SUCT 1200ML W/VALVE (MISCELLANEOUS) ×3 IMPLANT
CANNULA DILATOR  5MM W/SLV (CANNULA) ×1
CANNULA DILATOR 12 W/SLV (CANNULA) IMPLANT
CANNULA DILATOR 12MM W/SLV (CANNULA)
CANNULA DILATOR 5 W/SLV (CANNULA) ×2 IMPLANT
CATH TRAY 16F METER LATEX (MISCELLANEOUS) IMPLANT
CHLORAPREP W/TINT 26ML (MISCELLANEOUS) ×3 IMPLANT
CLEANER CAUTERY TIP 5X5 PAD (MISCELLANEOUS) ×1 IMPLANT
DEFOGGER SCOPE WARMER CLEARIFY (MISCELLANEOUS) ×3 IMPLANT
DRAPE INCISE IOBAN 66X45 STRL (DRAPES) ×3 IMPLANT
DRSG TELFA 3X8 NADH (GAUZE/BANDAGES/DRESSINGS) ×3 IMPLANT
GLOVE BIO SURGEON STRL SZ7 (GLOVE) ×15 IMPLANT
GOWN STRL REUS W/ TWL LRG LVL3 (GOWN DISPOSABLE) ×3 IMPLANT
GOWN STRL REUS W/TWL LRG LVL3 (GOWN DISPOSABLE) ×9
GRASPER SUT TROCAR 14GX15 (MISCELLANEOUS) ×3 IMPLANT
HANDLE YANKAUER SUCT BULB TIP (MISCELLANEOUS) ×6 IMPLANT
HOLDER FOLEY CATH W/STRAP (MISCELLANEOUS) IMPLANT
IRRIGATION STRYKERFLOW (MISCELLANEOUS) IMPLANT
IRRIGATOR STRYKERFLOW (MISCELLANEOUS)
IV LACTATED RINGERS 1000ML (IV SOLUTION) IMPLANT
KIT PINK PAD W/HEAD ARE REST (MISCELLANEOUS) ×3
KIT PINK PAD W/HEAD ARM REST (MISCELLANEOUS) ×1 IMPLANT
KIT RM TURNOVER STRD PROC AR (KITS) ×3 IMPLANT
LABEL OR SOLS (LABEL) IMPLANT
LIQUID BAND (GAUZE/BANDAGES/DRESSINGS) ×3 IMPLANT
NEEDLE VERESS 14GA 120MM (NEEDLE) IMPLANT
NS IRRIG 500ML POUR BTL (IV SOLUTION) ×3 IMPLANT
PACK LAP CHOLECYSTECTOMY (MISCELLANEOUS) ×3 IMPLANT
PAD CLEANER CAUTERY TIP 5X5 (MISCELLANEOUS) ×2
PAD GROUND ADULT SPLIT (MISCELLANEOUS) ×3 IMPLANT
PENCIL ELECTRO HAND CTR (MISCELLANEOUS) ×3 IMPLANT
POUCH ENDO CATCH 10MM SPEC (MISCELLANEOUS) IMPLANT
SCISSORS METZENBAUM CVD 33 (INSTRUMENTS) ×3 IMPLANT
SHEARS HARMONIC ACE PLUS 36CM (ENDOMECHANICALS) ×3 IMPLANT
SLEEVE ENDOPATH XCEL 5M (ENDOMECHANICALS) ×3 IMPLANT
SPONGE LAP 18X18 5 PK (GAUZE/BANDAGES/DRESSINGS) ×3 IMPLANT
STAPLER SKIN PROX 35W (STAPLE) IMPLANT
SUT MAXON ABS #0 GS21 30IN (SUTURE) ×6 IMPLANT
SUT SILK 3-0 (SUTURE)
SUT SILK 3-0 SH-1 18XCR BRD (SUTURE)
SUT VIC AB 0 CT1 36 (SUTURE) ×6 IMPLANT
SUT VIC AB 0 SH 27 (SUTURE) IMPLANT
SUT VIC AB 2-0 CT1 27 (SUTURE) ×3
SUT VIC AB 2-0 CT1 TAPERPNT 27 (SUTURE) ×1 IMPLANT
SUT VIC AB 4-0 FS2 27 (SUTURE) ×6 IMPLANT
SUTURE SILK 3-0 SH-1 18XCR BRD (SUTURE) IMPLANT
SYR BULB IRRIG 60ML STRL (SYRINGE) IMPLANT
TROCAR XCEL NON-BLD 11X100MML (ENDOMECHANICALS) ×3 IMPLANT
TROCAR XCEL NON-BLD 5MMX100MML (ENDOMECHANICALS) ×3 IMPLANT
TROCAR XCEL UNIV SLVE 11M 100M (ENDOMECHANICALS) IMPLANT
TUBING CONNECTING 10 (TUBING) ×2 IMPLANT
TUBING CONNECTING 10' (TUBING) ×1
TUBING INSUFFLATOR HI FLOW (MISCELLANEOUS) ×3 IMPLANT

## 2014-12-09 NOTE — H&P (View-Only) (Signed)
Patient ID: Alan Fernandez, male   DOB: 05-02-1955, 59 y.o.   MRN: [id number]  Chief Complaint  Patient presents with  . Other    abnormal Pet scan    HPI Alan Fernandez is a 59 y.o. male here today to discuss his Pet scan that was done on 11/19/14. He started having pain on his left side LUQ/thoracic and was treated with antibiotics for diverticulitis. The pain went away and and later returned LLQ. He thus had a CT and another antibiotic. He has overall abdominal discomfort. He has not had regular bowel movements since January. The two episodes of abdominal pain have resolved. He reports getting short of breath more easily than he used to. He denies night sweats, fever, chills, malaise, loss of weight or appetite.  HPI  Past Medical History  Diagnosis Date  . Asthma   . Hemorrhoid   . Eczema   . Pneumonia Feb 2016    Past Surgical History  Procedure Laterality Date  . Colonoscopy  2013  . Disk in neck  2003    History reviewed. No pertinent family history.  Social History Social History  Substance Use Topics  . Smoking status: Never Smoker   . Smokeless tobacco: Never Used  . Alcohol Use: Yes    No Known Allergies  Current Outpatient Prescriptions  Medication Sig Dispense Refill  . clobetasol (TEMOVATE) 0.05 % external solution     . montelukast (SINGULAIR) 10 MG tablet Take 1 tablet by mouth daily.    . VENTOLIN HFA 108 (90 BASE) MCG/ACT inhaler      No current facility-administered medications for this visit.    Review of Systems Review of Systems  Constitutional: Negative.   Respiratory: Positive for shortness of breath.   Cardiovascular: Negative.     Blood pressure 142/64, pulse 84, resp. rate 14, height 5\' 10"  (1.778 m), weight 221 lb (100.245 kg).  Physical Exam Physical Exam  Constitutional: He is oriented to person, place, and time. He appears well-developed and well-nourished.  Eyes: Conjunctivae are normal. No scleral icterus.  Neck: Neck  supple.  Cardiovascular: Normal rate, regular rhythm and normal heart sounds.   Pulmonary/Chest: Effort normal and breath sounds normal.  Abdominal: Soft. Bowel sounds are normal. There is no hepatomegaly. No hernia.  Lymphadenopathy:    He has no cervical adenopathy.    He has no axillary adenopathy.       Right: No inguinal adenopathy present.       Left: No inguinal adenopathy present.  Neurological: He is alert and oriented to person, place, and time.  Skin: Skin is warm and dry.    Data Reviewed CT and PET scan showing 2 sizeable mesenteric masses PET positive.   Assessment    Oncology evaluation and CT evaluation suggest possibility of lymphoma. Discussed laparoscopy and biopsy.    Plan    Laparoscopic biopsy. Procedure explained in full including risks and benefits.    Patient's surgery has been scheduled for 12-09-14 at [facility]  PCP:  Wyatt Mage G 12/05/2014, 3:07 PM

## 2014-12-09 NOTE — Transfer of Care (Signed)
Immediate Anesthesia Transfer of Care Note  Patient: Alan Fernandez  Procedure(s) Performed: Procedure(s): DIAGNOSTIC LAPAROSCOPY AND LAPAROTOMY (N/A)  Patient Location: PACU  Anesthesia Type:General  Level of Consciousness: awake, alert , oriented and patient cooperative  Airway & Oxygen Therapy: Patient Spontanous Breathing and Patient connected to face mask oxygen  Post-op Assessment: Report given to RN, Post -op Vital signs reviewed and stable and Patient moving all extremities X 4  Post vital signs: Reviewed and stable  Last Vitals:  Filed Vitals:   12/09/14 0812  BP: 138/78  Pulse: 74  Temp: 36.6 C  Resp: 16    Complications: No apparent anesthesia complications

## 2014-12-09 NOTE — Interval H&P Note (Signed)
History and Physical Interval Note:  12/09/2014 8:24 AM  Alan Fernandez  has presented today for surgery, with the diagnosis of enlarged mesenteric nodes  The various methods of treatment have been discussed with the patient and family. After consideration of risks, benefits and other options for treatment, the patient has consented to  Procedure(s): LAPAROSCOPY DIAGNOSTIC (N/A) as a surgical intervention .  The patient's history has been reviewed, patient examined, no change in status, stable for surgery.  I have reviewed the patient's chart and labs.  Questions were answered to the patient's satisfaction.     SANKAR,SEEPLAPUTHUR G

## 2014-12-09 NOTE — Anesthesia Preprocedure Evaluation (Signed)
Anesthesia Evaluation  Patient identified by MRN, date of birth, ID band Patient awake    Reviewed: Allergy & Precautions, NPO status , Patient's Chart, lab work & pertinent test results  Airway Mallampati: III  TM Distance: >3 FB Neck ROM: Limited   Comment: C-spine disc surgery, somewhat limited extension. Dental   Pulmonary asthma , former smoker,  Mild seasonal asthma.   Pulmonary exam normal        Cardiovascular Exercise Tolerance: Good negative cardio ROS Normal cardiovascular exam     Neuro/Psych    GI/Hepatic   Endo/Other    Renal/GU      Musculoskeletal   Abdominal (+) + obese,  Abdomen: soft.    Peds  Hematology   Anesthesia Other Findings   Reproductive/Obstetrics                             Anesthesia Physical Anesthesia Plan  ASA: II  Anesthesia Plan: General   Post-op Pain Management:    Induction: Intravenous  Airway Management Planned: Oral ETT  Additional Equipment:   Intra-op Plan:   Post-operative Plan: Extubation in OR  Informed Consent: I have reviewed the patients History and Physical, chart, labs and discussed the procedure including the risks, benefits and alternatives for the proposed anesthesia with the patient or authorized representative who has indicated his/her understanding and acceptance.     Plan Discussed with: CRNA  Anesthesia Plan Comments: (Larascopy for mesenteric LN sampling.)        Anesthesia Quick Evaluation

## 2014-12-09 NOTE — Anesthesia Procedure Notes (Signed)
Procedure Name: Intubation Date/Time: 12/09/2014 8:58 AM Performed by: Silvana Newness Pre-anesthesia Checklist: Patient identified, Emergency Drugs available, Suction available, Patient being monitored and Timeout performed Patient Re-evaluated:Patient Re-evaluated prior to inductionOxygen Delivery Method: Circle system utilized Preoxygenation: Pre-oxygenation with 100% oxygen Intubation Type: IV induction Ventilation: Oral airway inserted - appropriate to patient size and Mask ventilation without difficulty Laryngoscope Size: McGraph and 4 Grade View: Grade I Tube type: Oral Number of attempts: 1 Airway Equipment and Method: Rigid stylet Placement Confirmation: ETT inserted through vocal cords under direct vision,  positive ETCO2 and breath sounds checked- equal and bilateral Secured at: 21 cm Tube secured with: Tape Dental Injury: Teeth and Oropharynx as per pre-operative assessment  Comments: Elective use of Mcgrath 4 due to patient history of neck surgery and some limited neck range of motion.  No issues with intubation

## 2014-12-09 NOTE — Op Note (Signed)
Preop diagnosis: Mesenteric mass versus lymphadenopathy   Post op diagnosis: No significant findings on laparoscopy and laparotomy  Operation: Diagnostic laparoscopy and a minilaparotomy  Surgeon: S.G.Sankar  Assistant:     Anesthesia: Gen.  Complications: None  EBL: Minimal  Drains: None  Description: Patient was put to sleep in supine position the operating table abdomen was prepped and draped sterile field. Timeout was. This patient had abnormal PET imaging of the abdomen showing 2 hypermetabolic areas both in the region of the mesentery of the transverse colon and right colon. The larger one was about 3 cm from the small one about the 1-1/2-2 cm. No other abnormal findings were noted on this exam and also on clinical exam no palpable lymph nodes elsewhere. Attempt was being made to do a biopsy to rule out lymphoma. Umbilical port side was planned and small incision made and the Veress needle position the peritoneal cavity verified of the hanging drop method and pneumoperitoneum was obtained followed by insertion of a 10 mm port. With the camera in place there was good visualization with no apparent abnormal findings in general inspection in the upper abdomen and in the mid abdomen. The left upper quadrant of 11 mm port and a left lateral mid abdominal 5 mm ports were placed. The transverse colon was lifted off and the omentum draped over the liver to expose the mesentery of the transverse colon. There was no apparent the visible mass however there was a slight bulge in a couple areas and accordingly this area was satisfactorily exposed. Imaging was used to identify the rough location of this which seemed to correspond with the mesal colon region. The mesentery was opened with the use of a Harmonic scalpel and dissected down for good distance with no apparent finding of an enlarged mass or lymph node. Similarly the gastrocolic ligament was opened and the mesal colon was inspected from the  superior aspect and again showed no apparent findings. Given this a small transverse minilaparotomy was planned overlying the right upper abdomen just above the level of the umbilicus. Incision was made and deepened through the subcutaneous tissue. The anterior sheath was opened along the line of incision and the rectus muscle was split along the line of its fibers. Posterior sheath and peritoneum were opened direct palpation of the mesocolon and the mesenteric tissue in the region showed no apparent palpable mass visual inspection with the used to retract as also showed no apparent the 3 cm or 2 cm mass as seen on the PET scan. It did not appear necessary to to expand the incision and try to look for an occult area. It was felt the best option is to do a follow-up scan in 2-3 months and reassess. Accordingly the laparotomy incision was closed. Posterior sheath and peritoneum closed with running 0 Vicryl and tissue closed with figure-of-eight stitch of 0 Maxon. Subcutaneous tissue closed with 2-0 Vicryl and the skin with subcuticular 4-0 Vicryl. The port incisions all closed with single sutures of subcuticular 4-0 Vicryl. All incisions were covered with liqui  Ban. Patient subsequently returned to PACU in stable condition.

## 2014-12-09 NOTE — Discharge Instructions (Signed)
AMBULATORY SURGERY  DISCHARGE INSTRUCTIONS   1) The drugs that you were given will stay in your system until tomorrow so for the next 24 hours you should not:  A) Drive an automobile B) Make any legal decisions C) Drink any alcoholic beverage   2) You may resume regular meals tomorrow.  Today it is better to start with liquids and gradually work up to solid foods.  You may eat anything you prefer, but it is better to start with liquids, then soup and crackers, and gradually work up to solid foods.   3) Please notify your doctor immediately if you have any unusual bleeding, trouble breathing, redness and pain at the surgery site, drainage, fever, or pain not relieved by medication.    4) Additional Instructions:  May Shower.  Take stool softner.  No heavy lifting  Exploratory Laparotomy, Adult, Care After Refer to this sheet in the next few weeks. These instructions provide you with information about caring for yourself after your procedure. Your health care provider may also give you more specific instructions. Your treatment has been planned according to current medical practices, but problems sometimes occur. Call your health care provider if you have any problems or questions after your procedure. WHAT TO EXPECT AFTER THE PROCEDURE After your procedure, it is typical to have: Abdominal soreness. Fatigue. A sore throat from tubes in your throat. A lack of appetite. HOME CARE INSTRUCTIONS Medicines Take medicines only as directed by your health care provider. Do not drive or operate heavy machinery while taking pain medicine. Incision Care There are many different ways to close and cover an incision, including stitches (sutures), skin glue, and adhesive strips. Follow your health care provider's instructions about: Incision care. Bandage (dressing) changes and removal. Incision closure removal. Do not take showers or baths until your health care provider says that you  can. Check your incision area daily for signs of infection. Watch for: Redness. Tenderness. Swelling. Drainage. Activities Do not lift anything that is heavier than 10 pounds (4.5 kg) until your health care provider says that it is safe. Try to walk a little bit each day if your health care provider says that it is okay. Ask your health care provider when you can start to do your usual activities again, such as driving, going back to work, and having sex. Eating and Drinking You may eat what you usually eat. Include lots of whole grains, fruits, and vegetables in your diet. This will help to prevent constipation. Drink enough fluid to keep your urine clear or pale yellow. General Instructions Keep all follow-up visits as directed by your health care provider. This is important. SEEK MEDICAL CARE IF:  You have a fever. You have chills. Your pain medicine is not helping. You have constipation or diarrhea. You have nausea or vomiting. You have drainage, redness, swelling, or pain at your incision site. SEEK IMMEDIATE MEDICAL CARE IF: Your pain is getting worse. It has been more than 3 days since you been able to have a bowel movement. You have ongoing (persistent) vomiting. The edges of your incision open up. You have warmth, tenderness, and swelling in your calf. You have trouble breathing. You have chest pain.  This information is not intended to replace advice given to you by your health care provider. Make sure you discuss any questions you have with your health care provider.  Document Released: 09/23/2003 Document Revised: 03/01/2014 Document Reviewed: 09/26/2013  Please contact your physician with any problems or Same Day  Surgery at [phone], Monday through Friday 6 am to 4 pm, or [place] at [facility] number at [phone].

## 2014-12-09 NOTE — Anesthesia Postprocedure Evaluation (Signed)
  Anesthesia Post-op Note  Patient: Alan Fernandez  Procedure(s) Performed: Procedure(s): DIAGNOSTIC LAPAROSCOPY AND LAPAROTOMY (N/A)  Anesthesia type:General  Patient location: PACU  Post pain: Pain level controlled  Post assessment: Post-op Vital signs reviewed, Patient's Cardiovascular Status Stable, Respiratory Function Stable, Patent Airway and No signs of Nausea or vomiting  Post vital signs: Reviewed and stable  Last Vitals:  Filed Vitals:   12/09/14 1128  BP: 129/80  Pulse: 74  Temp:   Resp: 12    Level of consciousness: awake, alert  and patient cooperative  Complications: No apparent anesthesia complications

## 2014-12-17 ENCOUNTER — Ambulatory Visit (INDEPENDENT_AMBULATORY_CARE_PROVIDER_SITE_OTHER): Payer: Managed Care, Other (non HMO) | Admitting: General Surgery

## 2014-12-17 ENCOUNTER — Ambulatory Visit: Payer: Managed Care, Other (non HMO) | Admitting: General Surgery

## 2014-12-17 ENCOUNTER — Encounter: Payer: Self-pay | Admitting: General Surgery

## 2014-12-17 VITALS — BP 130/80 | HR 80 | Resp 12 | Ht 70.0 in | Wt 218.0 lb

## 2014-12-17 DIAGNOSIS — K6389 Other specified diseases of intestine: Secondary | ICD-10-CM

## 2014-12-17 DIAGNOSIS — K668 Other specified disorders of peritoneum: Secondary | ICD-10-CM

## 2014-12-17 NOTE — Patient Instructions (Addendum)
Patient to return in ine month. May drive, light activity ok. Avoid exertional activity

## 2014-12-17 NOTE — Progress Notes (Signed)
This is a 59 year old male here today for his post op laparotomy done on 12/09/14. Patient state he is doing well.  At laparoscopy and mini lap failed to reveal the mass in mesentery-mesenteric tissue was very thick.  Port sites  And incision are clean and healing well.  Patient to return in one month. Long term plan- repeat PET in 4 mos.

## 2015-01-14 ENCOUNTER — Ambulatory Visit: Payer: Managed Care, Other (non HMO) | Admitting: General Surgery

## 2015-01-14 ENCOUNTER — Encounter: Payer: Self-pay | Admitting: General Surgery

## 2015-01-14 ENCOUNTER — Ambulatory Visit (INDEPENDENT_AMBULATORY_CARE_PROVIDER_SITE_OTHER): Payer: Managed Care, Other (non HMO) | Admitting: General Surgery

## 2015-01-14 VITALS — BP 130/84 | HR 80 | Resp 12 | Ht 67.0 in | Wt 210.0 lb

## 2015-01-14 DIAGNOSIS — K668 Other specified disorders of peritoneum: Secondary | ICD-10-CM

## 2015-01-14 DIAGNOSIS — K6389 Other specified diseases of intestine: Secondary | ICD-10-CM

## 2015-01-14 NOTE — Progress Notes (Signed)
This is a 59 year old male here today for his post op laparotomy done on 12/09/14. Patient state he is doing well.  I have reviewed the history of present illness with the patient.  At laparoscopy and mini lap failed to reveal the mass in mesentery-mesenteric tissue was very thick.  Port sites and incision are clean and healing well. Abdomen is soft, non tender. Patient to return in six weeks.Will arrange for f/u imaging-PET vs CT. Will discuss with Dr. Oliva Bustard. Pt advised he can resume normal activity.

## 2015-01-14 NOTE — Patient Instructions (Addendum)
Patient to return in six weeks.  

## 2015-02-26 ENCOUNTER — Ambulatory Visit (INDEPENDENT_AMBULATORY_CARE_PROVIDER_SITE_OTHER): Payer: Managed Care, Other (non HMO) | Admitting: General Surgery

## 2015-02-26 ENCOUNTER — Encounter: Payer: Self-pay | Admitting: General Surgery

## 2015-02-26 VITALS — BP 130/72 | HR 74 | Resp 12 | Ht 67.0 in | Wt 222.0 lb

## 2015-02-26 DIAGNOSIS — K6389 Other specified diseases of intestine: Secondary | ICD-10-CM

## 2015-02-26 DIAGNOSIS — K668 Other specified disorders of peritoneum: Secondary | ICD-10-CM

## 2015-02-26 NOTE — Patient Instructions (Signed)
Patient to return in two months with PET scan .

## 2015-02-26 NOTE — Progress Notes (Signed)
Patient ID: Alan Fernandez, male   DOB: 1955/06/12, 60 y.o.   MRN: [id number]  Chief Complaint  Patient presents with  . Follow-up    HPI Alan Fernandez is a 60 y.o. male  here today for his post op laparotomy done on 12/09/14. Patient state he is doing well. I have reviewed the history of present illness with the patient.   HPI  Past Medical History  Diagnosis Date  . Asthma   . Hemorrhoid   . Eczema   . Pneumonia Feb 2016    Past Surgical History  Procedure Laterality Date  . Colonoscopy  2013  . Disk in neck  2003  . Hemorrhoid surgery  1995  . Tonsillectomy  1965  . Laparoscopy N/A 12/09/2014    Procedure: DIAGNOSTIC LAPAROSCOPY AND LAPAROTOMY;  Surgeon: Christene Lye, MD;  Location: [facility] ORS;  Service: General;  Laterality: N/A;    History reviewed. No pertinent family history.  Social History Social History  Substance Use Topics  . Smoking status: Former Smoker    Quit date: 12/05/2004  . Smokeless tobacco: Never Used  . Alcohol Use: 4.8 oz/week    8 Cans of beer per week    Allergies  Allergen Reactions  . Adhesive [Tape] Other (See Comments)    Pulls skin off. Paper tape is OK to use as long as it isn't too tight.    Current Outpatient Prescriptions  Medication Sig Dispense Refill  . clobetasol (TEMOVATE) 0.05 % external solution Apply topically as needed.     . montelukast (SINGULAIR) 10 MG tablet Take 1 tablet by mouth daily. At bedtime.    . VENTOLIN HFA 108 (90 BASE) MCG/ACT inhaler Inhale 2 puffs into the lungs every 4 (four) hours as needed.      No current facility-administered medications for this visit.    Review of Systems Review of Systems  Constitutional: Negative.   Respiratory: Negative.   Cardiovascular: Negative.     Blood pressure 130/72, pulse 74, resp. rate 12, height 5\' 7"  (1.702 m), weight 222 lb (100.699 kg).  Physical Exam Physical Exam  Constitutional: He is oriented to person, place, and time. He appears  well-developed and well-nourished.  Eyes: Conjunctivae are normal. No scleral icterus.  Neck: Neck supple.  Cardiovascular: Normal rate, regular rhythm and normal heart sounds.   Pulmonary/Chest: Effort normal and breath sounds normal.  Abdominal: Soft. Normal appearance and bowel sounds are normal. There is no tenderness.  Lymphadenopathy:    He has no cervical adenopathy.    He has no axillary adenopathy.       Right: No inguinal adenopathy present.       Left: No inguinal adenopathy present.  Neurological: He is oriented to person, place, and time.  Skin: Skin is warm and dry.    Data Reviewed Notes reviewed  Assessment    Stable exam. Pt with PET pos mass ruq mesentery-could not be identified with laparoscopy and mini lap. Exam shows no mass or lymphadenopathy anywhere else.       Plan   Follow with PET scan  Patient to return in two months with PET scan.   PCP:  Benita Stabile C This information has been scribed by Gaspar Cola CMA.       Trenise Turay G 02/27/2015, 10:36 AM

## 2015-02-27 ENCOUNTER — Encounter: Payer: Self-pay | Admitting: General Surgery

## 2015-04-14 ENCOUNTER — Telehealth: Payer: Self-pay | Admitting: *Deleted

## 2015-04-14 NOTE — Telephone Encounter (Signed)
Patient was told that he should let you know if he has flu like symptoms. He has experiencing fever, chills,body aches and headaches. No vomiting or diarrhea. He had the flu shot in October 2016

## 2015-04-16 NOTE — Telephone Encounter (Signed)
Per Dr. Jamal Collin if fever and flu symptoms consist patient will let us know so he can be seen earlier than scheduled appointment in March. Per patient the symptoms are random and not every night. Will talk to Dr. Oliva Bustard tomorrow regarding the PET scan. Patient advised and verbalized understanding.

## 2015-04-17 ENCOUNTER — Other Ambulatory Visit: Payer: Self-pay | Admitting: *Deleted

## 2015-04-17 DIAGNOSIS — K6389 Other specified diseases of intestine: Secondary | ICD-10-CM

## 2015-04-17 NOTE — Progress Notes (Signed)
Per Dr. Jamal Collin, insurance has denied PET scan. PET scan cancelled. Dr. Jamal Collin wants patient to have a CT scan instead. Spoke with Peggy in the Scheduling Department about this today.   Patient has been scheduled for a CT abdomen/pelvis with contrast at [facility] for 04-23-15 at 9 am (arrive 8:45 am). Prep: NPO 4 hours prior, pick up prep kit,  and take medication list. Patient verbalizes understanding.  This patient will follow up in the office as scheduled with Dr. Jamal Collin.

## 2015-04-23 ENCOUNTER — Ambulatory Visit
Admission: RE | Admit: 2015-04-23 | Discharge: 2015-04-23 | Disposition: A | Discharge status: Home / Self Care | Payer: Managed Care, Other (non HMO) | Source: Ambulatory Visit | Attending: General Surgery | Admitting: General Surgery

## 2015-04-23 ENCOUNTER — Ambulatory Visit: Payer: Managed Care, Other (non HMO)

## 2015-04-23 DIAGNOSIS — K668 Other specified disorders of peritoneum: Secondary | ICD-10-CM | POA: Diagnosis present

## 2015-04-23 DIAGNOSIS — K6389 Other specified diseases of intestine: Secondary | ICD-10-CM

## 2015-04-23 DIAGNOSIS — R590 Localized enlarged lymph nodes: Secondary | ICD-10-CM | POA: Insufficient documentation

## 2015-04-23 LAB — POCT I-STAT CREATININE: CREATININE: 1.3 mg/dL — AB (ref 0.61–1.24)

## 2015-04-23 MED ORDER — IOHEXOL 350 MG/ML SOLN
100.0000 mL | Freq: Once | INTRAVENOUS | Status: AC | PRN
  Administered 2015-04-23: 100 mL via INTRAVENOUS

## 2015-04-29 ENCOUNTER — Ambulatory Visit (INDEPENDENT_AMBULATORY_CARE_PROVIDER_SITE_OTHER): Payer: Managed Care, Other (non HMO) | Admitting: General Surgery

## 2015-04-29 ENCOUNTER — Encounter: Payer: Self-pay | Admitting: General Surgery

## 2015-04-29 VITALS — BP 142/80 | HR 70 | Resp 12 | Ht 70.0 in | Wt 221.0 lb

## 2015-04-29 DIAGNOSIS — K668 Other specified disorders of peritoneum: Secondary | ICD-10-CM

## 2015-04-29 DIAGNOSIS — K6389 Other specified diseases of intestine: Secondary | ICD-10-CM

## 2015-04-29 NOTE — Patient Instructions (Signed)
Ct guided biopsy of retroperitoneal node is to be scheduled. Will discuss with oncologist regarding any additional workup

## 2015-04-29 NOTE — Progress Notes (Signed)
This is a 60 year old male here today to discuss ct scan done on 04/23/15.   States generalized abdominal discomfort and night sweats nightly.  CT scan reviewed-shows progression of mesenteric and retroperitoneal lymphadenopathy. None that is easily accessible. CT guided core biopsy is feasible.  Discussed CT guided biopsy. Had throat swelling reaction to contrast. Plan to do biopsy next week after talking to radiologist.   Ct guided biopsy of retroperitoneal node is to be scheduled. Will discuss with oncologist regarding any additional workup   PCP:  Benita Stabile  This information has been scribed by Gaspar Cola CMA.

## 2015-04-30 ENCOUNTER — Other Ambulatory Visit: Payer: Self-pay | Admitting: *Deleted

## 2015-04-30 DIAGNOSIS — R591 Generalized enlarged lymph nodes: Secondary | ICD-10-CM

## 2015-05-01 ENCOUNTER — Telehealth: Payer: Self-pay | Admitting: *Deleted

## 2015-05-01 ENCOUNTER — Inpatient Hospital Stay: Payer: Managed Care, Other (non HMO) | Attending: Oncology | Admitting: Oncology

## 2015-05-01 VITALS — BP 137/85 | HR 73 | Temp 97.8°F | Resp 18 | Wt 221.1 lb

## 2015-05-01 DIAGNOSIS — M549 Dorsalgia, unspecified: Secondary | ICD-10-CM | POA: Insufficient documentation

## 2015-05-01 DIAGNOSIS — J45909 Unspecified asthma, uncomplicated: Secondary | ICD-10-CM | POA: Diagnosis not present

## 2015-05-01 DIAGNOSIS — Z8719 Personal history of other diseases of the digestive system: Secondary | ICD-10-CM | POA: Insufficient documentation

## 2015-05-01 DIAGNOSIS — R109 Unspecified abdominal pain: Secondary | ICD-10-CM | POA: Diagnosis not present

## 2015-05-01 DIAGNOSIS — I872 Venous insufficiency (chronic) (peripheral): Secondary | ICD-10-CM | POA: Insufficient documentation

## 2015-05-01 DIAGNOSIS — R591 Generalized enlarged lymph nodes: Secondary | ICD-10-CM

## 2015-05-01 DIAGNOSIS — N400 Enlarged prostate without lower urinary tract symptoms: Secondary | ICD-10-CM | POA: Diagnosis not present

## 2015-05-01 DIAGNOSIS — Z87891 Personal history of nicotine dependence: Secondary | ICD-10-CM | POA: Insufficient documentation

## 2015-05-01 DIAGNOSIS — Z8042 Family history of malignant neoplasm of prostate: Secondary | ICD-10-CM | POA: Diagnosis not present

## 2015-05-01 DIAGNOSIS — Z806 Family history of leukemia: Secondary | ICD-10-CM | POA: Insufficient documentation

## 2015-05-01 DIAGNOSIS — L309 Dermatitis, unspecified: Secondary | ICD-10-CM | POA: Diagnosis not present

## 2015-05-01 DIAGNOSIS — Z808 Family history of malignant neoplasm of other organs or systems: Secondary | ICD-10-CM | POA: Diagnosis not present

## 2015-05-01 DIAGNOSIS — G8929 Other chronic pain: Secondary | ICD-10-CM | POA: Diagnosis not present

## 2015-05-01 DIAGNOSIS — R590 Localized enlarged lymph nodes: Secondary | ICD-10-CM | POA: Diagnosis present

## 2015-05-01 DIAGNOSIS — Z8701 Personal history of pneumonia (recurrent): Secondary | ICD-10-CM | POA: Diagnosis not present

## 2015-05-01 NOTE — Progress Notes (Signed)
Patient states he has felt like he had the flu - achiness all over.

## 2015-05-01 NOTE — Telephone Encounter (Signed)
Patient called back and was notified as instructed. He verbalizes understanding. 

## 2015-05-01 NOTE — Telephone Encounter (Signed)
Message left on home and work numbers for patient to call the office.   We have scheduled patient for a CT guided biopsy retroperitoneal node at [facility] for 05-08-15 at 8 am (arrive 7 am). Prep: NPO 8 hours prior and patient will not take any medications the morning of procedure.

## 2015-05-02 ENCOUNTER — Inpatient Hospital Stay: Payer: Managed Care, Other (non HMO)

## 2015-05-02 ENCOUNTER — Encounter: Payer: Self-pay | Admitting: Oncology

## 2015-05-02 DIAGNOSIS — R591 Generalized enlarged lymph nodes: Secondary | ICD-10-CM

## 2015-05-02 DIAGNOSIS — R590 Localized enlarged lymph nodes: Secondary | ICD-10-CM | POA: Diagnosis not present

## 2015-05-02 LAB — CBC WITH DIFFERENTIAL/PLATELET
Basophils Absolute: 0 10*3/uL (ref 0–0.1)
Basophils Relative: 0 %
EOS ABS: 0.2 10*3/uL (ref 0–0.7)
EOS PCT: 5 %
HCT: 40.1 % (ref 40.0–52.0)
HEMOGLOBIN: 13.9 g/dL (ref 13.0–18.0)
LYMPHS ABS: 1 10*3/uL (ref 1.0–3.6)
Lymphocytes Relative: 24 %
MCH: 31.5 pg (ref 26.0–34.0)
MCHC: 34.6 g/dL (ref 32.0–36.0)
MCV: 91.1 fL (ref 80.0–100.0)
MONOS PCT: 9 %
Monocytes Absolute: 0.4 10*3/uL (ref 0.2–1.0)
NEUTROS PCT: 62 %
Neutro Abs: 2.7 10*3/uL (ref 1.4–6.5)
Platelets: 195 10*3/uL (ref 150–440)
RBC: 4.4 MIL/uL (ref 4.40–5.90)
RDW: 13.1 % (ref 11.5–14.5)
WBC: 4.3 10*3/uL (ref 3.8–10.6)

## 2015-05-02 NOTE — Progress Notes (Signed)
Alan Fernandez @ [facility] Telephone:([phone]) [phone]  Fax:([phone]) [phone]  Progress note  Alan Fernandez OB: 1955-05-15  MR#: [id number]  [id number]  Patient Care Team: Alan Billet, MD as PCP - General (Internal Medicine) Alan Lye, MD (General Surgery) Alan Billet, MD (Internal Medicine)  CHIEF COMPLAINT:  Chief Complaint  Patient presents with  . Enlarged lymph nodes   retroperitoneal lymphadenopathy approximately 1 months duration  VISIT DIAGNOSIS:   retroperitoneal lymphadenopathy       INTERVAL HISTORY:  60 year old gentleman who started having pain a month ago in the left flank area was treated with antibiotics felt better.  A month ago had some lower abdominal discomfort treated again with antibiotics his CT scan was done which revealed retroperitoneal mass and lymphadenopathy patient was referred to me at this point in time for further evaluation  Patient had lymphadenopathy which was evaluated by Alan Fernandez on exploratory laparotomy could not find any enlarged lymph node. A repeat CT scan was done recently which continues to show retroperitoneal adenopathy Patient is here for further follow-up Patient had flulike illness last week which for which patient has recovered Chronic back pain continues to be a problem No chills fever nor night sweats   Patient has chronic venous insufficiency being treated by Alan Fernandez REVIEW OF SYSTEMS:   GENERAL:  Feels good.  Active.  No fevers, sweats or weight loss. PERFORMANCE STATUS (ECOG): 0 HEENT:  No visual changes, runny nose, sore throat, mouth sores or tenderness. Lungs: History of allergic asthma being treated by ventiolin  and the singular Cardiac:  No chest pain, palpitations, orthopnea, or PND. GI:  Abdominal pain as described above GU:  No urgency, frequency, dysuria, or hematuria. Musculoskeletal:  No back pain.  No joint pain.  No muscle tenderness. Extremities: Well enough lower extremity patient has  chronic venous insufficiency Skin:  No rashes or skin changes. Neuro:  No headache, numbness or weakness, balance or coordination issues. Endocrine:  No diabetes, thyroid issues, hot flashes or night sweats. Psych:  No mood changes, depression or anxiety. Pain:  No focal pain. Review of systems:  All other systems reviewed and found to be negative.  As per HPI. Otherwise, a complete review of systems is negatve.  PAST MEDICAL HISTORY: Past Medical History  Diagnosis Date  . Asthma   . Hemorrhoid   . Eczema   . Pneumonia Feb 2016  . Cancer [facility])     PAST SURGICAL HISTORY: Past Surgical History  Procedure Laterality Date  . Colonoscopy  2013  . Disk in neck  2003  . Hemorrhoid surgery  1995  . Tonsillectomy  1965  . Laparoscopy N/A 12/09/2014    Procedure: DIAGNOSTIC LAPAROSCOPY AND LAPAROTOMY;  Surgeon: Alan Lye, MD;  Location: [facility] ORS;  Service: General;  Laterality: N/A;    FAMILY HISTORY Patient has strong family history of melanoma and leukemia in father and prostate cancer with uncle.     ADVANCED DIRECTIVES:  Patient does have advance healthcare directive, Patient   does not desire to make any changes  HEALTH MAINTENANCE: Social History  Substance Use Topics  . Smoking status: Former Smoker    Quit date: 12/05/2004  . Smokeless tobacco: Never Used  . Alcohol Use: 4.8 oz/week    8 Cans of beer per week    Allergies  Allergen Reactions  . Adhesive [Tape] Other (See Comments)    Pulls skin off. Paper tape is OK to use as long as it isn't too  tight.    Current Outpatient Prescriptions  Medication Sig Dispense Refill  . clobetasol (TEMOVATE) 0.05 % external solution Apply topically as needed.     . montelukast (SINGULAIR) 10 MG tablet Take 1 tablet by mouth daily. At bedtime.    . VENTOLIN HFA 108 (90 BASE) MCG/ACT inhaler Inhale 2 puffs into the lungs every 4 (four) hours as needed.      No current facility-administered medications for this  visit.    OBJECTIVE: PHYSICAL EXAM: GENERAL:  Well developed, well nourished, sitting comfortably in the exam room in no acute distress. MENTAL STATUS:  Alert and oriented to person, place and time. No jaundice RESPIRATORY:  Clear to auscultation without rales, wheezes or rhonchi.  Occasional wheezing CARDIOVASCULAR:  Regular rate and rhythm without murmur, rub or gallop.  ABDOMEN:  Soft, non-tender, with active bowel sounds, and no hepatosplenomegaly.  No masses. BACK:  No CVA tenderness.  No tenderness on percussion of the back or rib cage. SKIN:  No rashes, ulcers or lesions. EXTREMITIES: No edema, no skin discoloration or tenderness.  No palpable cords. LYMPH NODES: No palpable cervical, supraclavicular, axillary or inguinal adenopathy  NEUROLOGICAL: Unremarkable. PSYCH:  Appropriate.  Filed Vitals:   05/01/15 1620  BP: 137/85  Pulse: 73  Temp: 97.8 F (36.6 C)  Resp: 18     Body mass index is 31.73 kg/(m^2).    ECOG  0  LAB RESULTS:  No visits with results within 5 Day(s) from this visit. Latest known visit with results is:  Hospital Outpatient Visit on 04/23/2015  Component Date Value Ref Range Status  . Creatinine, Ser 04/23/2015 1.30* 0.61 - 1.24 mg/dL Final     STUDIES: Ct Abdomen Pelvis W Contrast  04/23/2015  CLINICAL DATA:  Pt states he's had an unsuccessful biopsy unable to get to mesenteric mass, today's scan is to see if mass has moved to an area for biopsy, pt states overall discomfort in entire abd, body aches, night sweats, takes ibuprofen daily tries to stay at 4 extra strength or less, nausea, denies V/D, no surgical hx to abd besides biopsy (Sept/Oct 2016). No new trauma. 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 EXAM: CT ABDOMEN AND PELVIS WITH CONTRAST TECHNIQUE: Multidetector CT imaging of the abdomen and pelvis was performed using the standard protocol following bolus administration of intravenous contrast. CONTRAST:  178mL OMNIPAQUE  IOHEXOL 350 MG/ML SOLN COMPARISON:  PET-CT 11/19/2014 and previous studies FINDINGS: Lower chest:  No acute findings. Hepatobiliary: Stable probable cysts in hepatic segments 2 and 6. No new liver lesion. Gallbladder nondilated. Pancreas: Diffuse parenchymal atrophy without focal lesion or ductal dilatation. Spleen: Within normal limits in size and appearance. Adrenals/Urinary Tract: No masses identified. No evidence of hydronephrosis. Stomach/Bowel: No evidence of obstruction, inflammatory process, or abnormal fluid collections. Normal appendix. Vascular/Lymphatic: Central mesenteric adenopathy, largest mass to the right of midline 18 x 44 mm (previously 20 x 39). There is to 11 mm short axis diameter node anterior to the distal duodenum, previously 6 mm. Additional subcentimeter nodes appear somewhat more prominent as well. Sub cm low left para-aortic and paracaval lymph nodes. 13 mm short axis diameter left para-aortic node image 34/2, previously 7 mm. No pelvic adenopathy. Bilateral subcentimeter inguinal nodes. Patchy aortoiliac arterial calcifications without aneurysm. Portal vein patent. Reproductive: Mild prostatic enlargement with central coarse calcification. Other: No ascites.  No free air. Musculoskeletal: No suspicious bone lesions identified. Minimal spurring in the lumbar spine. IMPRESSION: 1. Progression of central mesenteric and retroperitoneal adenopathy,  reactive change versus lymphoproliferative process. The left para-aortic or right mesenteric adenopathy may be approachable from a posterior approach under CT-fluoro guidance for percutaneous core biopsy if needed. Electronically Signed   By: Lucrezia Europe M.D.   On: 04/23/2015 09:56    ASSESSMENT: retroperitoneal adenopathy and mass in the mesentery  CT scan shows progressive retroperitoneal lymphadenopathy We discussed this case with Alan Fernandez  and decided to proceed with CT-guided biopsy  PLAN:    CT scan has been reviewed  independently Proceed with CT-guided biopsy.  And chronic lymphocytic leukemia profile Evaluate patient after biopsies done Discuss case in tumor conference  Total duration of visit was 30 minutes.  50% or more time was spent in counseling patient and family regarding prognosis and options of treatment and available resources Patient expressed understanding and was in agreement with this plan. He also understands that He can call clinic at any time with any questions, concerns, or complaints.    No matching staging information was found for the patient.  Alan Gleason, MD   05/02/2015 1:15 PM

## 2015-05-06 ENCOUNTER — Other Ambulatory Visit: Payer: Self-pay | Admitting: Radiology

## 2015-05-07 LAB — COMP PANEL: LEUKEMIA/LYMPHOMA

## 2015-05-08 ENCOUNTER — Ambulatory Visit
Admission: RE | Admit: 2015-05-08 | Discharge: 2015-05-08 | Disposition: A | Discharge status: Home / Self Care | Payer: Managed Care, Other (non HMO) | Source: Ambulatory Visit | Attending: General Surgery | Admitting: General Surgery

## 2015-05-08 DIAGNOSIS — J45909 Unspecified asthma, uncomplicated: Secondary | ICD-10-CM | POA: Diagnosis not present

## 2015-05-08 DIAGNOSIS — Z87891 Personal history of nicotine dependence: Secondary | ICD-10-CM | POA: Insufficient documentation

## 2015-05-08 DIAGNOSIS — R591 Generalized enlarged lymph nodes: Secondary | ICD-10-CM | POA: Insufficient documentation

## 2015-05-08 HISTORY — PX: LYMPH NODE BIOPSY: SHX201

## 2015-05-08 LAB — CBC
HEMATOCRIT: 39.6 % — AB (ref 40.0–52.0)
Hemoglobin: 13.5 g/dL (ref 13.0–18.0)
MCH: 31.1 pg (ref 26.0–34.0)
MCHC: 34.1 g/dL (ref 32.0–36.0)
MCV: 91.3 fL (ref 80.0–100.0)
Platelets: 200 10*3/uL (ref 150–440)
RBC: 4.34 MIL/uL — AB (ref 4.40–5.90)
RDW: 13.4 % (ref 11.5–14.5)
WBC: 4 10*3/uL (ref 3.8–10.6)

## 2015-05-08 LAB — PROTIME-INR
INR: 1.05
Prothrombin Time: 13.9 seconds (ref 11.4–15.0)

## 2015-05-08 LAB — APTT: aPTT: 30 seconds (ref 24–36)

## 2015-05-08 MED ORDER — FENTANYL CITRATE (PF) 100 MCG/2ML IJ SOLN
INTRAMUSCULAR | Status: AC
  Filled 2015-05-08: qty 4

## 2015-05-08 MED ORDER — FENTANYL CITRATE (PF) 100 MCG/2ML IJ SOLN
INTRAMUSCULAR | Status: AC | PRN
  Administered 2015-05-08: 25 ug via INTRAVENOUS
  Administered 2015-05-08: 50 ug via INTRAVENOUS

## 2015-05-08 MED ORDER — SODIUM CHLORIDE 0.9 % IV SOLN
Freq: Once | INTRAVENOUS | Status: AC
Start: 2015-05-08 — End: 2015-05-08
  Administered 2015-05-08: 08:00:00 via INTRAVENOUS

## 2015-05-08 MED ORDER — MIDAZOLAM HCL 5 MG/5ML IJ SOLN
INTRAMUSCULAR | Status: AC
  Filled 2015-05-08: qty 5

## 2015-05-08 MED ORDER — MIDAZOLAM HCL 5 MG/5ML IJ SOLN
INTRAMUSCULAR | Status: AC | PRN
  Administered 2015-05-08: 0.5 mg via INTRAVENOUS
  Administered 2015-05-08 (×2): 1 mg via INTRAVENOUS

## 2015-05-08 NOTE — H&P (Signed)
Chief Complaint: Patient was seen in consultation today for a CT guided retroperitoneal biopsy at the request of Sankar,Seeplaputhur G  Referring Physician(s): Sankar,Seeplaputhur G  History of Present Illness: Alan Fernandez is a 60 y.o. male with a central mesenteric nodal mass and small retroperitoneal lymph nodes.  He underwent a laparoscopy and minilaparotomy in October 2016 but the nodal mass was unable to be identified and biopsied.  Follow-up CT on 04/23/15 suggests progression of mesenteric and retroperitoneal lymphadenopathy.  The lymphadenopathy was discovered on 11/08/14 when the patient was having left buttock pain and LLQ pain.  Patient says he had some chills and night sweats in Feb 2017 but that has resolved.  Denies abdominal pain now.  Has chronic and worsening back pain.  No weight loss or change in appetite.    Past Medical History  Diagnosis Date  . Asthma   . Hemorrhoid   . Eczema   . Pneumonia Feb 2016  . Cancer [facility])     Past Surgical History  Procedure Laterality Date  . Colonoscopy  2013  . Disk in neck  2003  . Hemorrhoid surgery  1995  . Tonsillectomy  1965  . Laparoscopy N/A 12/09/2014    Procedure: DIAGNOSTIC LAPAROSCOPY AND LAPAROTOMY;  Surgeon: Christene Lye, MD;  Location: [facility] ORS;  Service: General;  Laterality: N/A;    Allergies: Adhesive  Medications: Prior to Admission medications   Medication Sig Start Date End Date Taking? Authorizing Provider  clobetasol (TEMOVATE) 0.05 % external solution Apply topically as needed.  12/22/12  Yes Historical Provider, MD  montelukast (SINGULAIR) 10 MG tablet Take 1 tablet by mouth daily. At bedtime. 10/23/12  Yes Historical Provider, MD  VENTOLIN HFA 108 (90 BASE) MCG/ACT inhaler Inhale 2 puffs into the lungs every 4 (four) hours as needed.  01/28/13  Yes Historical Provider, MD     History reviewed. No pertinent family history.  Social History   Social History  . Marital Status: Married      Spouse Name: N/A  . Number of Children: N/A  . Years of Education: N/A   Social History Main Topics  . Smoking status: Former Smoker    Quit date: 12/05/2004  . Smokeless tobacco: Never Used  . Alcohol Use: 4.8 oz/week    8 Cans of beer per week     Comment: 4-5 beers per week  . Drug Use: No  . Sexual Activity: Not Asked   Other Topics Concern  . None   Social History Narrative     Review of Systems  Constitutional: Positive for fatigue. Negative for chills and appetite change.  Respiratory: Negative.   Cardiovascular: Negative.   Gastrointestinal: Negative.   Genitourinary: Negative.     Vital Signs: BP 119/76 mmHg  Pulse 77  Temp(Src) 98 F (36.7 C) (Oral)  Resp 20  SpO2 94%  Physical Exam  Cardiovascular: Normal rate, regular rhythm and normal heart sounds.  Exam reveals no gallop and no friction rub.   No murmur heard. Pulmonary/Chest: Effort normal and breath sounds normal. No respiratory distress. He has no wheezes. He has no rales.  Abdominal: Soft. Bowel sounds are normal. He exhibits no distension. There is no tenderness.    Mallampati Score:  MD Evaluation Airway: WNL Heart: WNL Abdomen: WNL Chest/ Lungs: WNL ASA  Classification: 1 Mallampati/Airway Score: One  Imaging: Ct Abdomen Pelvis W Contrast  04/23/2015  CLINICAL DATA:  Pt states he's had an unsuccessful biopsy unable to get to  mesenteric mass, today's scan is to see if mass has moved to an area for biopsy, pt states overall discomfort in entire abd, body aches, night sweats, takes ibuprofen daily tries to stay at 4 extra strength or less, nausea, denies V/D, no surgical hx to abd besides biopsy (Sept/Oct 2016). No new trauma. 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 EXAM: CT ABDOMEN AND PELVIS WITH CONTRAST TECHNIQUE: Multidetector CT imaging of the abdomen and pelvis was performed using the standard protocol following bolus administration of intravenous contrast. CONTRAST:   1102mL OMNIPAQUE IOHEXOL 350 MG/ML SOLN COMPARISON:  PET-CT 11/19/2014 and previous studies FINDINGS: Lower chest:  No acute findings. Hepatobiliary: Stable probable cysts in hepatic segments 2 and 6. No new liver lesion. Gallbladder nondilated. Pancreas: Diffuse parenchymal atrophy without focal lesion or ductal dilatation. Spleen: Within normal limits in size and appearance. Adrenals/Urinary Tract: No masses identified. No evidence of hydronephrosis. Stomach/Bowel: No evidence of obstruction, inflammatory process, or abnormal fluid collections. Normal appendix. Vascular/Lymphatic: Central mesenteric adenopathy, largest mass to the right of midline 18 x 44 mm (previously 20 x 39). There is to 11 mm short axis diameter node anterior to the distal duodenum, previously 6 mm. Additional subcentimeter nodes appear somewhat more prominent as well. Sub cm low left para-aortic and paracaval lymph nodes. 13 mm short axis diameter left para-aortic node image 34/2, previously 7 mm. No pelvic adenopathy. Bilateral subcentimeter inguinal nodes. Patchy aortoiliac arterial calcifications without aneurysm. Portal vein patent. Reproductive: Mild prostatic enlargement with central coarse calcification. Other: No ascites.  No free air. Musculoskeletal: No suspicious bone lesions identified. Minimal spurring in the lumbar spine. IMPRESSION: 1. Progression of central mesenteric and retroperitoneal adenopathy, reactive change versus lymphoproliferative process. The left para-aortic or right mesenteric adenopathy may be approachable from a posterior approach under CT-fluoro guidance for percutaneous core biopsy if needed. Electronically Signed   By: Lucrezia Europe M.D.   On: 04/23/2015 09:56    Labs:  CBC:  Recent Labs  06/26/14 1114 11/14/14 1425 05/02/15 1222 05/08/15 0712  WBC 4.3 4.2 4.3 4.0  HGB  --  14.8 13.9 13.5  HCT 40.1 43.2 40.1 39.6*  PLT 180 190 195 200    COAGS:  Recent Labs  05/08/15 0712  INR 1.05   APTT 30    BMP:  Recent Labs  06/26/14 1114 11/14/14 1425 04/23/15 0910  NA 139 136  --   K 4.2 3.8  --   CL 100 104  --   CO2 23 27  --   GLUCOSE 91 80  --   BUN 10 15  --   CALCIUM 8.9 8.4*  --   CREATININE 1.39* 1.14 1.30*  GFRNONAA 55* >60  --   GFRAA 64 >60  --     LIVER FUNCTION TESTS:  Recent Labs  06/26/14 1114 11/14/14 1425  BILITOT 0.8 0.9  AST 21 19  ALT 19 22  ALKPHOS 62 64  PROT 5.9* 6.7  ALBUMIN 4.1 4.2    TUMOR MARKERS: No results for input(s): AFPTM, CEA, CA199, CHROMGRNA in the last 8760 hours.  Assessment and Plan:  60 yo with an enlarging central mesenteric nodal mass and small retroperitoneal lymph nodes.  Patient needs a tissue diagnosis.  Discussed CT guided lymph node biopsy with patient in depth.  Explained the risks which include bleeding, infection and non diagnostic biopsy.  In particular, I am concerned that the biopsy could be non diagnostic due to the small size of the retroperitoneal lymph nodes  and presumed diagnosis of lymphoma.  Patient understands that even though we may successfully biopsy a lymph node, it may not reveal a diagnosis.  In addition, we discussed biopsying the central mesenteric mass if there is a percutaneous window identified.  Informed consent obtained from patient.  Plan for CT guided biopsy of a retroperitoneal lymph node or mesenteric nodal lesion with moderate sedation.      Electronically Signed: Carylon Perches 05/08/2015, 8:13 AM

## 2015-05-08 NOTE — Procedures (Signed)
Post-Procedure Note  Pre-operative Diagnosis: Abdominal lymphadenopathy       Post-operative Diagnosis: Abdominal lymphadenopathy.  Nodal mass appears stable but some of mesenteric nodes have decreased in size.   Indications:  Need tissue diagnosis of mesenteric nodal mass  Procedure Details:   Informed consent obtained. CT guided biopsy of mesenteric nodal mass performed from right retroperitoneal approach.  5 cores obtained.  No immediate complication.  Minimal blood loss.  Findings: Right mesenteric nodal mass.  Some right mesenteric nodes have decreased in size since 09/16.  Biopsy needle in nodal mass.  Complications: No immediate     Condition: Good, stable  Plan: Bedrest 2 hours then discharge to home.

## 2015-05-15 ENCOUNTER — Inpatient Hospital Stay (HOSPITAL_BASED_OUTPATIENT_CLINIC_OR_DEPARTMENT_OTHER): Payer: Managed Care, Other (non HMO) | Admitting: Oncology

## 2015-05-15 VITALS — BP 145/80 | HR 77 | Temp 97.2°F | Resp 18 | Wt 220.5 lb

## 2015-05-15 DIAGNOSIS — Z808 Family history of malignant neoplasm of other organs or systems: Secondary | ICD-10-CM

## 2015-05-15 DIAGNOSIS — R590 Localized enlarged lymph nodes: Secondary | ICD-10-CM

## 2015-05-15 DIAGNOSIS — I872 Venous insufficiency (chronic) (peripheral): Secondary | ICD-10-CM

## 2015-05-15 DIAGNOSIS — G8929 Other chronic pain: Secondary | ICD-10-CM

## 2015-05-15 DIAGNOSIS — M549 Dorsalgia, unspecified: Secondary | ICD-10-CM

## 2015-05-15 DIAGNOSIS — R599 Enlarged lymph nodes, unspecified: Secondary | ICD-10-CM

## 2015-05-15 DIAGNOSIS — L309 Dermatitis, unspecified: Secondary | ICD-10-CM

## 2015-05-15 DIAGNOSIS — R109 Unspecified abdominal pain: Secondary | ICD-10-CM

## 2015-05-15 DIAGNOSIS — N400 Enlarged prostate without lower urinary tract symptoms: Secondary | ICD-10-CM

## 2015-05-15 DIAGNOSIS — J45909 Unspecified asthma, uncomplicated: Secondary | ICD-10-CM

## 2015-05-15 DIAGNOSIS — Z8719 Personal history of other diseases of the digestive system: Secondary | ICD-10-CM

## 2015-05-15 DIAGNOSIS — Z8042 Family history of malignant neoplasm of prostate: Secondary | ICD-10-CM

## 2015-05-15 DIAGNOSIS — Z806 Family history of leukemia: Secondary | ICD-10-CM

## 2015-05-15 DIAGNOSIS — Z87891 Personal history of nicotine dependence: Secondary | ICD-10-CM

## 2015-05-15 DIAGNOSIS — Z8701 Personal history of pneumonia (recurrent): Secondary | ICD-10-CM

## 2015-05-15 LAB — SURGICAL PATHOLOGY

## 2015-05-15 NOTE — Progress Notes (Signed)
Patient here today for biospy results.

## 2015-05-16 ENCOUNTER — Encounter: Payer: Self-pay | Admitting: Oncology

## 2015-05-16 NOTE — Progress Notes (Signed)
[place] @ [facility] [phone]  [phone]  Progress note  Alan Fernandez OB: [birth year]  MR#: [id number]  [id number]  Patient Care Team: Albina Billet, MD as PCP - General (Internal Medicine) Christene Lye, MD (General Surgery) Albina Billet, MD (Internal Medicine)  CHIEF COMPLAINT:  Chief Complaint  Patient presents with  . Lymphadenopathy   retroperitoneal lymphadenopathy approximately 1 months duration  VISIT DIAGNOSIS:   retroperitoneal lymphadenopathy       INTERVAL HISTORY:  60 year old gentleman who started having pain a month ago in the left flank area was treated with antibiotics felt better.  A month ago had some lower abdominal discomfort treated again with antibiotics his CT scan was done which revealed retroperitoneal mass and lymphadenopathy patient was referred to me at this point in time for further evaluation  Patient had lymphadenopathy which was evaluated by Dr. Jamal Collin on exploratory laparotomy could not find any enlarged lymph node. A repeat CT scan was done recently which continues to show retroperitoneal adenopathy Patient is here for further follow-up Patient had flulike illness last week which for which patient has recovered Chronic back pain continues to be a problem No chills fever nor night sweats   Patient has chronic venous insufficiency being treated by Dr. Jamal Collin REVIEW OF SYSTEMS:   GENERAL:  Feels good.  Active.  No fevers, sweats or weight loss. PERFORMANCE STATUS (ECOG): 0 HEENT:  No visual changes, runny nose, sore throat, mouth sores or tenderness. Lungs: History of allergic asthma being treated by ventiolin  and the singular Cardiac:  No chest pain, palpitations, orthopnea, or PND. GI:  Abdominal pain as described above GU:  No urgency, frequency, dysuria, or hematuria. Musculoskeletal:  No back pain.  No joint pain.  No muscle tenderness. Extremities: Well enough lower extremity patient has chronic  venous insufficiency Skin:  No rashes or skin changes. Neuro:  No headache, numbness or weakness, balance or coordination issues. Endocrine:  No diabetes, thyroid issues, hot flashes or night sweats. Psych:  No mood changes, depression or anxiety. Pain:  No focal pain. Review of systems:  All other systems reviewed and found to be negative.  As per HPI. Otherwise, a complete review of systems is negatve.  PAST MEDICAL HISTORY: Past Medical History  Diagnosis Date  . Asthma   . Hemorrhoid   . Eczema   . Pneumonia Feb 2016  . Cancer [facility])     PAST SURGICAL HISTORY: Past Surgical History  Procedure Laterality Date  . Colonoscopy  2013  . Disk in neck  2003  . Hemorrhoid surgery  1995  . Tonsillectomy  1965  . Laparoscopy N/A 12/09/2014    Procedure: DIAGNOSTIC LAPAROSCOPY AND LAPAROTOMY;  Surgeon: Christene Lye, MD;  Location: [facility] ORS;  Service: General;  Laterality: N/A;    FAMILY HISTORY Patient has strong family history of melanoma and leukemia in father and prostate cancer with uncle.     ADVANCED DIRECTIVES:  Patient does have advance healthcare directive, Patient   does not desire to make any changes  HEALTH MAINTENANCE: Social History  Substance Use Topics  . Smoking status: Former Smoker    Quit date: 12/05/2004  . Smokeless tobacco: Never Used  . Alcohol Use: 4.8 oz/week    8 Cans of beer per week     Comment: 4-5 beers per week    Allergies  Allergen Reactions  . Adhesive [Tape] Other (See Comments)    Pulls skin off. Paper tape is OK to  use as long as it isn't too tight.    Current Outpatient Prescriptions  Medication Sig Dispense Refill  . clobetasol (TEMOVATE) 0.05 % external solution Apply topically as needed.     . montelukast (SINGULAIR) 10 MG tablet Take 1 tablet by mouth daily. At bedtime.    . VENTOLIN HFA 108 (90 BASE) MCG/ACT inhaler Inhale 2 puffs into the lungs every 4 (four) hours as needed.      No current  facility-administered medications for this visit.    OBJECTIVE: PHYSICAL EXAM: GENERAL:  Well developed, well nourished, sitting comfortably in the exam room in no acute distress. MENTAL STATUS:  Alert and oriented to person, place and time. No jaundice RESPIRATORY:  Clear to auscultation without rales, wheezes or rhonchi.  Occasional wheezing CARDIOVASCULAR:  Regular rate and rhythm without murmur, rub or gallop.  ABDOMEN:  Soft, non-tender, with active bowel sounds, and no hepatosplenomegaly.  No masses. BACK:  No CVA tenderness.  No tenderness on percussion of the back or rib cage. SKIN:  No rashes, ulcers or lesions. EXTREMITIES: No edema, no skin discoloration or tenderness.  No palpable cords. LYMPH NODES: No palpable cervical, supraclavicular, axillary or inguinal adenopathy  NEUROLOGICAL: Unremarkable. PSYCH:  Appropriate.  Filed Vitals:   05/15/15 1017  BP: 145/80  Pulse: 77  Temp: 97.2 F (36.2 C)  Resp: 18     Body mass index is 31.64 kg/(m^2).    ECOG  0  LAB RESULTS:  No visits with results within 5 Day(s) from this visit. Latest known visit with results is:  Hospital Outpatient Visit on 05/08/2015  Component Date Value Ref Range Status  . aPTT 05/08/2015 30  24 - 36 seconds Final  . WBC 05/08/2015 4.0  3.8 - 10.6 K/uL Final  . RBC 05/08/2015 4.34* 4.40 - 5.90 MIL/uL Final  . Hemoglobin 05/08/2015 13.5  13.0 - 18.0 g/dL Final  . HCT 05/08/2015 39.6* 40.0 - 52.0 % Final  . MCV 05/08/2015 91.3  80.0 - 100.0 fL Final  . MCH 05/08/2015 31.1  26.0 - 34.0 pg Final  . MCHC 05/08/2015 34.1  32.0 - 36.0 g/dL Final  . RDW 05/08/2015 13.4  11.5 - 14.5 % Final  . Platelets 05/08/2015 200  150 - 440 K/uL Final  . Prothrombin Time 05/08/2015 13.9  11.4 - 15.0 seconds Final  . INR 05/08/2015 1.05   Final  . SURGICAL PATHOLOGY 05/08/2015    Final                   Value:Surgical Pathology CASE: [id number] PATIENT: Alan Fernandez Surgical Pathology  Report     SPECIMEN SUBMITTED: A. Mesenteric lymph node, left abdominal B. Mesenteric lymph node, left abdominal  CLINICAL HISTORY: Hypermetabolic mesenteric nodal mass  PRE-OPERATIVE DIAGNOSIS: Question lymphoproliferative process or lymphoma  POST-OPERATIVE DIAGNOSIS: None provided.     DIAGNOSIS: A-B. 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 - NO DEFINITE EVIDENCE OF AN OVERT LYMPHOMA IDENTIFIED.  Comment: Given the presence of granulomatous inflammation, special stains were obtained. GMS-fungal, AFB, and Gram stains are negative with appropriate controls. A sample was sent for flow cytometric analysis ([facility] accession [id number]) with the following interpretation: CD5 +/CD23+ clonal B-cell population, consistent with CLL/SLL phenotype in 1% of leukocytes                         . Previous flow reports on peripheral blood performed 05/07/15  and 11/14/14 showed no significant immunophenotypic abnormality.  Given the above findings and clinical history, selected slides and blocks were sent to [facility], [place], [place] for consultation. This is the comment of the external consultant Dr. Pricilla Handler Oza:  Sections show needle core biopsy with fibrovascular stroma and predominantly small lymphocytic infiltrate with a few histiocytes, occasional plasma cells and occasional granulocytes. Numerous non-necrotizing granulomas were noted on core biopsy sample. No evidence of atypical Reed-Sternberg cells was identified. 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 Immunostains for CD3, CD20, CD5, CD23, BCL-1, BCL-2, BCL-6, kappa and lambda are performed. Immunohistoc                          hemical evaluation reveals a small subset of B cell population with coexpression of CD20, dim CD5, and CD23 correlating with flow findings. BCL-2 is positive in lymphocytes. Cyclin D1 and BCL 6 are negative. Kappa and lambda highlight a small subset of polytypic plasma cells. CD3 highlights predominance of small T cells in the parafollicular zones. CD30 is negative. AFB and GMS are negative for microorganisms in granulomatous lesion; however, it does not exclude an infectious etiology. The morphologic and immunohistochemical profile are consistent with a non-necrotizing granulomatous lymphadenitis without an overt lymphoma in this small sample. The significance of small B-cell clone by flow analysis is uncertain, and a possibility of low level lymph node involvement by a monoclonal B cell lymphocytosis of undetermined clinical significance is not completely excluded in this evaluation. Clinical and laboratory correlation is recommended with regards to presence of a lymph                         oma at another site and defining an exact etiology for non-necrotizing granulomatous inflammation. Selected slides from the case were reviewed by the pathology group in the [facility] QA/QC conference with general agreement for the above interpretation.   GROSS DESCRIPTION: A. Labeled: Mesenteric lymph node  Tissue fragment(s): 2  Size: 0.4-0.8 x 0.1 cm  Description: Cylindrically shaped tan red tissue fragments. 2 touch preps are made, and a portion of the specimen is sent for flow cytometry studies  Entirely submitted in one cassette(s).  B. Labeled: Mesenteric lymph node  Tissue fragment(s): 2  Size: 0.6-0.8 cm x 0.1 cm  Description: Pink red cylindrically shaped tissue fragments. 2 touch preps are made  Entirely submitted in 2 cassette(s).        Final Diagnosis performed by Delorse Lek, MD.  Electronically signed 05/15/2015 5:17:42PM    The  electronic signature indicates that the named Attending Pathologist has evaluated the specime                         n  Technical component performed at [facility], [address] [place], [place] [address] Lab: [phone] Dir: Darrick Penna. Evette Doffing, MD  Professional component performed at [facility], [facility], [place], [place], [place] [address] Lab: [id number] Dir: Dellia Nims. Rubinas, MD       STUDIES: Ct Abdomen Pelvis W Contrast  04/23/2015  CLINICAL DATA:  Pt states he's had an unsuccessful biopsy unable to get to mesenteric mass, today's scan is to see if mass has moved to an area for biopsy, pt states overall discomfort in entire  abd, body aches, night sweats, takes ibuprofen daily tries to stay at 4 extra strength or less, nausea, denies V/D, no surgical hx to abd besides biopsy (Sept/Oct 2016). No new trauma. 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 EXAM: CT ABDOMEN AND PELVIS WITH CONTRAST TECHNIQUE: Multidetector CT imaging of the abdomen and pelvis was performed using the standard protocol following bolus administration of intravenous contrast. CONTRAST:  148mL OMNIPAQUE IOHEXOL 350 MG/ML SOLN COMPARISON:  PET-CT 11/19/2014 and previous studies FINDINGS: Lower chest:  No acute findings. Hepatobiliary: Stable probable cysts in hepatic segments 2 and 6. No new liver lesion. Gallbladder nondilated. Pancreas: Diffuse parenchymal atrophy without focal lesion or ductal dilatation. Spleen: Within normal limits in size and appearance. Adrenals/Urinary Tract: No masses identified. No evidence of hydronephrosis. Stomach/Bowel: No evidence of obstruction, inflammatory process, or abnormal fluid collections. Normal appendix. Vascular/Lymphatic: Central mesenteric adenopathy, largest mass to the right of midline 18 x 44 mm (previously 20 x 39). There is to 11 mm short axis diameter node anterior to the distal duodenum, previously 6 mm. Additional subcentimeter  nodes appear somewhat more prominent as well. Sub cm low left para-aortic and paracaval lymph nodes. 13 mm short axis diameter left para-aortic node image 34/2, previously 7 mm. No pelvic adenopathy. Bilateral subcentimeter inguinal nodes. Patchy aortoiliac arterial calcifications without aneurysm. Portal vein patent. Reproductive: Mild prostatic enlargement with central coarse calcification. Other: No ascites.  No free air. Musculoskeletal: No suspicious bone lesions identified. Minimal spurring in the lumbar spine. IMPRESSION: 1. Progression of central mesenteric and retroperitoneal adenopathy, reactive change versus lymphoproliferative process. The left para-aortic or right mesenteric adenopathy may be approachable from a posterior approach under CT-fluoro guidance for percutaneous core biopsy if needed. Electronically Signed   By: Lucrezia Europe M.D.   On: 04/23/2015 09:56   Ct Biopsy  05/08/2015  INDICATION: 60 year old with a mesenteric nodal mass. Prior laparoscopy was unable to biopsy the lesion. Request for tissue diagnosis. EXAM: CT-GUIDED CORE BIOPSY OF MESENTERIC MASS MEDICATIONS: None. ANESTHESIA/SEDATION: Moderate (conscious) sedation was employed during this procedure. A total of Versed 2.5 mg and Fentanyl 75 mcg was administered intravenously. Moderate Sedation Time: 45 minutes. The patient's level of consciousness and vital signs were monitored continuously by radiology nursing throughout the procedure under my direct supervision. FLUOROSCOPY TIME:  None COMPLICATIONS: None immediate. PROCEDURE: Informed written consent was obtained from the patient after a thorough discussion of the procedural risks, benefits and alternatives. All questions were addressed. A timeout was performed prior to the initiation of the procedure. Patient was placed prone on the CT scanner. Images through the abdomen were obtained. The right flank was prepped with chlorhexidine and a sterile field was created. Skin and soft  tissues were anesthetized with 1% lidocaine. 12 gauge needle was directed towards the right abdominal mesenteric lesion. Needle was re-positioned multiple times in order to safely avoid the duodenum and the central mesenteric vessels. Eventually, the 17 gauge coaxial needle was safely placed within the mass. Five core biopsies were obtained with an 18 gauge core device. 17 gauge needle was removed without complication. Bandage placed over the puncture site. FINDINGS: Again noted is a nodal mass in the right abdominal mesentery. On today's study, this lesion measures up to 3.8 cm. Interestingly, there are small mesenteric lymph nodes in the right lower mesentery. Index lymph node on sequence 2, image 41 measures 1.5 x 0.9 cm and this node measured 2.1 x 1.2 cm on 11/08/2014. There also appears to be some change in the  right retrocrural lymphadenopathy. Multiple small lymph nodes in this area. However, there is a lymph node on sequence 2, image 17 that measures 1 cm in the short axis and this was not clearly present on the study from 11/19/2014. IMPRESSION: Successful CT-guided core biopsies of the right mesenteric nodal mass. The size of the nodal mass has minimally changed but there has been a change in some of the other lymph nodes. There has been a clear decrease in the size of lymph nodes in the right lower abdominal mesentery. Concern for some progression of disease involving a right retrocrural lymph node. These findings suggest that these nodal process could be reactive in etiology. Electronically Signed   By: Markus Daft M.D.   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 Total duration of visit was 30 minutes.  50% or more time was spent in counseling patient and family regarding prognosis and options of treatment and available resources Patient expressed understanding and was in agreement with this plan. He also understands that He can call clinic at any time with any questions, concerns, or complaints.    No matching staging information was found for the patient.  Forest Gleason, MD   05/16/2015 9:02 AM

## 2015-09-03 ENCOUNTER — Encounter: Payer: Self-pay | Admitting: General Surgery

## 2015-09-03 ENCOUNTER — Ambulatory Visit (INDEPENDENT_AMBULATORY_CARE_PROVIDER_SITE_OTHER): Payer: Managed Care, Other (non HMO) | Admitting: General Surgery

## 2015-09-03 VITALS — BP 156/86 | HR 82 | Resp 18 | Ht 70.0 in | Wt 218.0 lb

## 2015-09-03 DIAGNOSIS — I872 Venous insufficiency (chronic) (peripheral): Secondary | ICD-10-CM | POA: Diagnosis not present

## 2015-09-03 NOTE — Progress Notes (Signed)
Patient ID: Alan Fernandez, male   DOB: 16-Jan-1956, 60 y.o.   MRN: [id number]  Chief Complaint  Patient presents with  . Follow-up    HPI Alan Fernandez is a 60 y.o. male.   here for follow up for varicose veins. He states that he was doing well and had no problems with swelling in his legs until the summer heat and then he started having some swelling and weeping.  He has been wearing his compression hose daily.  He is doing well from the  Intraabdominal lymph node biopsy, he last saw Dr Oliva Bustard on 05-16-15. I have reviewed the history of present illness with the patient.   HPI  Past Medical History  Diagnosis Date  . Asthma   . Hemorrhoid   . Eczema   . Pneumonia Feb 2016    Past Surgical History  Procedure Laterality Date  . Colonoscopy  2013  . Disk in neck  2003  . Hemorrhoid surgery  1995  . Tonsillectomy  1965  . Laparoscopy N/A 12/09/2014    Procedure: DIAGNOSTIC LAPAROSCOPY AND LAPAROTOMY;  Surgeon: Christene Lye, MD;  Location: [facility] ORS;  Service: General;  Laterality: N/A;  . Lymph node biopsy  05-08-15    NON-NECROTIZING GRANULOMATOUS LYMPHADENITIS,     History reviewed. No pertinent family history.  Social History Social History  Substance Use Topics  . Smoking status: Former Smoker    Quit date: 12/05/2004  . Smokeless tobacco: Never Used  . Alcohol Use: 4.8 oz/week    8 Cans of beer per week     Comment: 4-5 beers per week    Allergies  Allergen Reactions  . Adhesive [Tape] Other (See Comments)    Pulls skin off. Paper tape is OK to use as long as it isn't too tight.    Current Outpatient Prescriptions  Medication Sig Dispense Refill  . clobetasol (TEMOVATE) 0.05 % external solution Apply topically as needed.     . montelukast (SINGULAIR) 10 MG tablet Take 1 tablet by mouth daily. At bedtime.    . VENTOLIN HFA 108 (90 BASE) MCG/ACT inhaler Inhale 2 puffs into the lungs every 4 (four) hours as needed.      No current  facility-administered medications for this visit.    Review of Systems Review of Systems  Constitutional: Negative.   Respiratory: Negative.   Cardiovascular: Negative.     Blood pressure 156/86, pulse 82, resp. rate 18, height 5\' 10"  (1.778 m), weight 218 lb (98.884 kg).  Physical Exam Physical Exam  Constitutional: He is oriented to person, place, and time. He appears well-developed and well-nourished.  Neurological: He is alert and oriented to person, place, and time.  Skin: Skin is warm and dry.  Mild to moderate edema both legs.  Focal 3-4 cm area of skin pigmentation right lower leg unchanged from before.No induration noted.   Data Reviewed Prior notes  Assessment    Venous insufficiemncy. Documented bilateral GSV reflux. Pt with no symptoms other than mild increase swelling and some dampness.   Intraabdominal enalrged mesenteric node neg for malignancy on biopsy  Plan   needs to rest and elevate legs for 1/2hr 2-3 times aday. Continue use of compression hose. Patient to return in October 2017 for ct scan      PCP:  Benita Stabile C This information has been scribed by Karie Fetch RN, BSN,BC.   Dunia Pringle G 09/04/2015, 3:23 PM

## 2015-09-03 NOTE — Patient Instructions (Addendum)
Continue use of compression hose.Patient to return in October 2017 for ct scan

## 2015-09-04 ENCOUNTER — Encounter: Payer: Self-pay | Admitting: General Surgery

## 2015-10-08 ENCOUNTER — Other Ambulatory Visit: Payer: Self-pay | Admitting: General Surgery

## 2015-10-08 ENCOUNTER — Encounter: Payer: Self-pay | Admitting: *Deleted

## 2015-10-08 DIAGNOSIS — K6389 Other specified diseases of intestine: Secondary | ICD-10-CM

## 2015-11-24 ENCOUNTER — Ambulatory Visit: Payer: Managed Care, Other (non HMO)

## 2015-11-25 ENCOUNTER — Ambulatory Visit: Payer: Managed Care, Other (non HMO)

## 2015-12-17 ENCOUNTER — Encounter: Payer: Self-pay | Admitting: General Surgery

## 2015-12-17 ENCOUNTER — Ambulatory Visit (INDEPENDENT_AMBULATORY_CARE_PROVIDER_SITE_OTHER): Payer: Managed Care, Other (non HMO) | Admitting: General Surgery

## 2015-12-17 VITALS — BP 134/72 | HR 74 | Resp 12 | Ht 70.0 in | Wt 222.0 lb

## 2015-12-17 DIAGNOSIS — K6389 Other specified diseases of intestine: Secondary | ICD-10-CM

## 2015-12-17 DIAGNOSIS — K639 Disease of intestine, unspecified: Secondary | ICD-10-CM | POA: Diagnosis not present

## 2015-12-17 NOTE — Patient Instructions (Signed)
Return in six months.  

## 2015-12-17 NOTE — Progress Notes (Signed)
Patient ID: Alan Fernandez, male   DOB: 1956-02-07, 60 y.o.   MRN: [id number]  Chief Complaint  Patient presents with  . Other    mass    HPI Alan Fernandez is a 60 y.o. male here today for his follow up from his biopsy on 05/08/2015 which was mesenteric node neg for malignancy. Patient states he is doing well. No new problems at this time. I have reviewed the history of present illness with the patient.  HPI  Past Medical History:  Diagnosis Date  . Asthma   . Eczema   . Hemorrhoid   . Pneumonia Feb 2016    Past Surgical History:  Procedure Laterality Date  . COLONOSCOPY  2013  . disk in neck  2003  . [place]  . LAPAROSCOPY N/A 12/09/2014   Procedure: DIAGNOSTIC LAPAROSCOPY AND LAPAROTOMY;  Surgeon: Christene Lye, MD;  Location: [facility] ORS;  Service: General;  Laterality: N/A;  . LYMPH NODE BIOPSY  05-08-15   NON-NECROTIZING GRANULOMATOUS LYMPHADENITIS,   . TONSILLECTOMY  1965    No family history on file.  Social History Social History  Substance Use Topics  . Smoking status: Former Smoker    Quit date: 12/05/2004  . Smokeless tobacco: Never Used  . Alcohol use 4.8 oz/week    8 Cans of beer per week     Comment: 4-5 beers per week    Allergies  Allergen Reactions  . Adhesive [Tape] Other (See Comments)    Pulls skin off. Paper tape is OK to use as long as it isn't too tight.    Current Outpatient Prescriptions  Medication Sig Dispense Refill  . clobetasol (TEMOVATE) 0.05 % external solution Apply topically as needed.     . montelukast (SINGULAIR) 10 MG tablet Take 1 tablet by mouth daily. At bedtime.    . VENTOLIN HFA 108 (90 BASE) MCG/ACT inhaler Inhale 2 puffs into the lungs every 4 (four) hours as needed.      No current facility-administered medications for this visit.     Review of Systems Review of Systems  Blood pressure 134/72, pulse 74, resp. rate 12, height 5\' 10"  (1.778 m), weight 222 lb (100.7 kg).  Physical  Exam Physical Exam  Constitutional: He is oriented to person, place, and time. He appears well-developed and well-nourished.  Eyes: Conjunctivae are normal. No scleral icterus.  Neck: Neck supple.  Cardiovascular: Regular rhythm and normal heart sounds.   Pulmonary/Chest: Breath sounds normal.  Abdominal: Soft. Bowel sounds are normal. He exhibits no mass. There is no hepatomegaly. There is no tenderness. No hernia.  Lymphadenopathy:    He has no cervical adenopathy.    He has no axillary adenopathy.       Right: No inguinal adenopathy present.       Left: No inguinal adenopathy present.  Neurological: He is alert and oriented to person, place, and time.  Skin: Skin is warm and dry.    Data Reviewed Prior  Notes reviewed   Assessment  Stable exam. Mesenteric lymph nodes with benign finding on path     Plan   Patient to return in six months. Call for any new abdominal symptoms This information has been scribed by Gaspar Cola CMA.   SANKAR,SEEPLAPUTHUR G 12/17/2015, 2:10 PM

## 2016-05-03 ENCOUNTER — Observation Stay
Admission: EM | Admit: 2016-05-03 | Discharge: 2016-05-04 | DRG: 282 | Disposition: A | Discharge status: Other Acute Inpatient Hospital | Payer: 59 | Attending: Internal Medicine | Admitting: Internal Medicine

## 2016-05-03 ENCOUNTER — Encounter: Payer: Self-pay | Admitting: Emergency Medicine

## 2016-05-03 ENCOUNTER — Emergency Department: Payer: 59

## 2016-05-03 DIAGNOSIS — E669 Obesity, unspecified: Secondary | ICD-10-CM | POA: Diagnosis present

## 2016-05-03 DIAGNOSIS — N182 Chronic kidney disease, stage 2 (mild): Secondary | ICD-10-CM

## 2016-05-03 DIAGNOSIS — R739 Hyperglycemia, unspecified: Secondary | ICD-10-CM | POA: Diagnosis not present

## 2016-05-03 DIAGNOSIS — R778 Other specified abnormalities of plasma proteins: Secondary | ICD-10-CM

## 2016-05-03 DIAGNOSIS — E785 Hyperlipidemia, unspecified: Secondary | ICD-10-CM | POA: Diagnosis present

## 2016-05-03 DIAGNOSIS — Z91048 Other nonmedicinal substance allergy status: Secondary | ICD-10-CM

## 2016-05-03 DIAGNOSIS — Z87891 Personal history of nicotine dependence: Secondary | ICD-10-CM

## 2016-05-03 DIAGNOSIS — I10 Essential (primary) hypertension: Secondary | ICD-10-CM | POA: Diagnosis present

## 2016-05-03 DIAGNOSIS — I129 Hypertensive chronic kidney disease with stage 1 through stage 4 chronic kidney disease, or unspecified chronic kidney disease: Secondary | ICD-10-CM | POA: Diagnosis not present

## 2016-05-03 DIAGNOSIS — R7989 Other specified abnormal findings of blood chemistry: Secondary | ICD-10-CM

## 2016-05-03 DIAGNOSIS — R079 Chest pain, unspecified: Secondary | ICD-10-CM

## 2016-05-03 DIAGNOSIS — Z8249 Family history of ischemic heart disease and other diseases of the circulatory system: Secondary | ICD-10-CM

## 2016-05-03 DIAGNOSIS — I252 Old myocardial infarction: Secondary | ICD-10-CM | POA: Diagnosis not present

## 2016-05-03 DIAGNOSIS — Z6830 Body mass index (BMI) 30.0-30.9, adult: Secondary | ICD-10-CM | POA: Diagnosis not present

## 2016-05-03 DIAGNOSIS — E876 Hypokalemia: Secondary | ICD-10-CM | POA: Diagnosis not present

## 2016-05-03 DIAGNOSIS — Z806 Family history of leukemia: Secondary | ICD-10-CM

## 2016-05-03 DIAGNOSIS — I25110 Atherosclerotic heart disease of native coronary artery with unstable angina pectoris: Secondary | ICD-10-CM | POA: Diagnosis not present

## 2016-05-03 DIAGNOSIS — R600 Localized edema: Secondary | ICD-10-CM | POA: Diagnosis present

## 2016-05-03 DIAGNOSIS — I214 Non-ST elevation (NSTEMI) myocardial infarction: Secondary | ICD-10-CM | POA: Diagnosis present

## 2016-05-03 DIAGNOSIS — I200 Unstable angina: Secondary | ICD-10-CM | POA: Diagnosis present

## 2016-05-03 LAB — DIFFERENTIAL
Basophils Absolute: 0 10*3/uL (ref 0–0.1)
Basophils Relative: 1 %
Eosinophils Absolute: 0.2 10*3/uL (ref 0–0.7)
Eosinophils Relative: 4 %
LYMPHS PCT: 26 %
Lymphs Abs: 1.2 10*3/uL (ref 1.0–3.6)
MONO ABS: 0.3 10*3/uL (ref 0.2–1.0)
Monocytes Relative: 7 %
NEUTROS ABS: 2.8 10*3/uL (ref 1.4–6.5)
Neutrophils Relative %: 62 %

## 2016-05-03 LAB — CBC
HCT: 41.1 % (ref 40.0–52.0)
Hemoglobin: 14.5 g/dL (ref 13.0–18.0)
MCH: 32.3 pg (ref 26.0–34.0)
MCHC: 35.2 g/dL (ref 32.0–36.0)
MCV: 91.8 fL (ref 80.0–100.0)
PLATELETS: 183 10*3/uL (ref 150–440)
RBC: 4.48 MIL/uL (ref 4.40–5.90)
RDW: 14.1 % (ref 11.5–14.5)
WBC: 4.5 10*3/uL (ref 3.8–10.6)

## 2016-05-03 LAB — LIPID PANEL
CHOL/HDL RATIO: 4.4 ratio
Cholesterol: 169 mg/dL (ref 0–200)
HDL: 38 mg/dL — ABNORMAL LOW (ref >40)
LDL CALC: 85 mg/dL (ref 0–99)
Triglycerides: 231 mg/dL — ABNORMAL HIGH (ref <150)
VLDL: 46 mg/dL — AB (ref 0–40)

## 2016-05-03 LAB — BASIC METABOLIC PANEL
Anion gap: 6 (ref 5–15)
BUN: 14 mg/dL (ref 6–20)
CALCIUM: 9.1 mg/dL (ref 8.9–10.3)
CHLORIDE: 103 mmol/L (ref 101–111)
CO2: 28 mmol/L (ref 22–32)
CREATININE: 1.33 mg/dL — AB (ref 0.61–1.24)
GFR, EST NON AFRICAN AMERICAN: 56 mL/min — AB (ref >60)
Glucose, Bld: 167 mg/dL — ABNORMAL HIGH (ref 65–99)
Potassium: 3.6 mmol/L (ref 3.5–5.1)
SODIUM: 137 mmol/L (ref 135–145)

## 2016-05-03 LAB — HEPATIC FUNCTION PANEL
ALBUMIN: 4 g/dL (ref 3.5–5.0)
ALK PHOS: 68 U/L (ref 38–126)
ALT: 17 U/L (ref 17–63)
AST: 22 U/L (ref 15–41)
BILIRUBIN INDIRECT: 0.5 mg/dL (ref 0.3–0.9)
Bilirubin, Direct: 0.2 mg/dL (ref 0.1–0.5)
TOTAL PROTEIN: 6.7 g/dL (ref 6.5–8.1)
Total Bilirubin: 0.7 mg/dL (ref 0.3–1.2)

## 2016-05-03 LAB — TROPONIN I: Troponin I: 0.04 ng/mL (ref <0.03)

## 2016-05-03 LAB — PROTIME-INR
INR: 0.98
PROTHROMBIN TIME: 13 s (ref 11.4–15.2)

## 2016-05-03 LAB — APTT: APTT: 29 s (ref 24–36)

## 2016-05-03 MED ORDER — METOPROLOL TARTRATE 25 MG PO TABS
25.0000 mg | ORAL_TABLET | Freq: Two times a day (BID) | ORAL | Status: DC
Start: 2016-05-03 — End: 2016-05-04
  Administered 2016-05-03: 25 mg via ORAL
  Filled 2016-05-03: qty 1

## 2016-05-03 MED ORDER — ONDANSETRON HCL 4 MG/2ML IJ SOLN: 4.0000 mg | Freq: Four times a day (QID) | INTRAMUSCULAR | Status: DC | PRN

## 2016-05-03 MED ORDER — ALBUTEROL SULFATE (2.5 MG/3ML) 0.083% IN NEBU: 2.5000 mg | INHALATION_SOLUTION | RESPIRATORY_TRACT | Status: DC | PRN

## 2016-05-03 MED ORDER — HEPARIN SODIUM (PORCINE) 5000 UNIT/ML IJ SOLN
4000.0000 [IU] | INTRAMUSCULAR | Status: AC
  Administered 2016-05-03: 4000 [IU] via INTRAVENOUS

## 2016-05-03 MED ORDER — SODIUM CHLORIDE 0.9 % IV SOLN: 10.0000 mL/h | INTRAVENOUS | Status: DC

## 2016-05-03 MED ORDER — HEPARIN BOLUS VIA INFUSION
4000.0000 [IU] | Freq: Once | INTRAVENOUS | Status: DC
  Filled 2016-05-03: qty 4000

## 2016-05-03 MED ORDER — MORPHINE SULFATE (PF) 4 MG/ML IV SOLN: 2.0000 mg | INTRAVENOUS | Status: DC | PRN

## 2016-05-03 MED ORDER — HEPARIN (PORCINE) IN NACL 100-0.45 UNIT/ML-% IJ SOLN
1100.0000 [IU]/h | INTRAMUSCULAR | Status: DC
  Administered 2016-05-03: 1100 [IU]/h via INTRAVENOUS
  Filled 2016-05-03: qty 250

## 2016-05-03 MED ORDER — HEPARIN (PORCINE) IN NACL 100-0.45 UNIT/ML-% IJ SOLN
1100.0000 [IU]/h | INTRAMUSCULAR | Status: DC
  Filled 2016-05-03: qty 250

## 2016-05-03 MED ORDER — ASPIRIN 81 MG PO CHEW
324.0000 mg | CHEWABLE_TABLET | Freq: Once | ORAL | Status: AC
  Administered 2016-05-03: 324 mg via ORAL

## 2016-05-03 MED ORDER — GI COCKTAIL ~~LOC~~
30.0000 mL | Freq: Four times a day (QID) | ORAL | Status: DC | PRN
  Filled 2016-05-03: qty 30

## 2016-05-03 MED ORDER — MONTELUKAST SODIUM 10 MG PO TABS
10.0000 mg | ORAL_TABLET | Freq: Every day | ORAL | Status: DC
  Administered 2016-05-03 – 2016-05-04 (×2): 10 mg via ORAL
  Filled 2016-05-03 (×2): qty 1

## 2016-05-03 MED ORDER — ATORVASTATIN CALCIUM 20 MG PO TABS
40.0000 mg | ORAL_TABLET | Freq: Every day | ORAL | Status: DC
  Administered 2016-05-03: 40 mg via ORAL
  Filled 2016-05-03: qty 2

## 2016-05-03 MED ORDER — NITROGLYCERIN 0.4 MG SL SUBL: 0.4000 mg | SUBLINGUAL_TABLET | SUBLINGUAL | Status: DC | PRN

## 2016-05-03 MED ORDER — PANTOPRAZOLE SODIUM 40 MG PO TBEC
40.0000 mg | DELAYED_RELEASE_TABLET | Freq: Every day | ORAL | Status: DC
Start: 2016-05-03 — End: 2016-05-04
  Administered 2016-05-03 – 2016-05-04 (×2): 40 mg via ORAL
  Filled 2016-05-03 (×2): qty 1

## 2016-05-03 MED ORDER — ACETAMINOPHEN 325 MG PO TABS: 650.0000 mg | ORAL_TABLET | ORAL | Status: DC | PRN

## 2016-05-03 NOTE — ED Triage Notes (Signed)
Pt to ED via POV with c/o CP x4-5days. Pt was seen at PCP today and told to come here to be evaluated. PT describes CP as burning along with throat burning, and bilat upper extremities. Pt A&Ox4, denies any SOB. VS stable

## 2016-05-03 NOTE — H&P (Signed)
[place] [facility] [place] NAME: Alan Fernandez    MR#:  [id number]  DATE OF BIRTH:  [birth year]  DATE OF ADMISSION:  05/03/2016  PRIMARY CARE PHYSICIAN: Albina Billet, MD   REQUESTING/REFERRING PHYSICIAN: Karma Greaser  CHIEF COMPLAINT:   Chest pain HISTORY OF PRESENT ILLNESS:  Alan Fernandez  is a 61 y.o. male with a known history ofAsthma and chronic lower extremity edema is present into the ED with a chief complaint of intermittent episodes of chest pain which has been getting worse. Patient started having chest pain episode starting from Thursday at 9 AM. He has multiple episodes of chest pains with exertion, each episode lasting approximately 10-15 minutes which are getting spontaneously resolved if he rests yesterday afternoon patient had crushing chest pain in the middle of the chest radiating to both shoulders associated with shortness of breath, diaphoresis and  became pale according to the wife. Patient also reporting burning sensation in his throat with the chest pain. Patient came into the ED initial EKG was suspicious for STEMI, and was seen and evaluated by on-call cardiologist Dr.Arida and ruled it out. Patient is started on heparin drip for unstable angina. Initial troponin I 0.04. Hospitalist team is called to admit the patient. During my examination patient is resting and denies any chest pain  PAST MEDICAL HISTORY:   Past Medical History:  Diagnosis Date  . Asthma   . Eczema   . Hemorrhoid   . Pneumonia Feb 2016    PAST SURGICAL HISTOIRY:   Past Surgical History:  Procedure Laterality Date  . COLONOSCOPY  2013  . disk in neck  2003  . [place]  . LAPAROSCOPY N/A 12/09/2014   Procedure: DIAGNOSTIC LAPAROSCOPY AND LAPAROTOMY;  Surgeon: Christene Lye, MD;  Location: [facility] ORS;  Service: General;  Laterality: N/A;  . LYMPH NODE BIOPSY  05-08-15   NON-NECROTIZING GRANULOMATOUS LYMPHADENITIS,   . TONSILLECTOMY   1965    SOCIAL HISTORY:   Social History  Substance Use Topics  . Smoking status: Former Smoker    Quit date: 12/05/2004  . Smokeless tobacco: Never Used  . Alcohol use 4.8 oz/week    8 Cans of beer per week     Comment: 4-5 beers per week    FAMILY HISTORY:  Father had heart attack at age 21 and passed away with leukemia  DRUG ALLERGIES:   Allergies  Allergen Reactions  . Adhesive [Tape] Other (See Comments)    Pulls skin off. Paper tape is OK to use as long as it isn't too tight.    REVIEW OF SYSTEMS:  CONSTITUTIONAL: No fever, fatigue or weakness.  EYES: No blurred or double vision.  EARS, NOSE, AND THROAT: No tinnitus or ear pain.  RESPIRATORY: No cough, shortness of breath, wheezing or hemoptysis.  CARDIOVASCULAR: Reporting intermittent episodes of exertional chest pain resolved with rest, denies  orthopnea, edema.  GASTROINTESTINAL: No nausea, vomiting, diarrhea or abdominal pain.  GENITOURINARY: No dysuria, hematuria.  ENDOCRINE: No polyuria, nocturia,  HEMATOLOGY: No anemia, easy bruising or bleeding SKIN: No rash or lesion. MUSCULOSKELETAL: No joint pain or arthritis.   NEUROLOGIC: No tingling, numbness, weakness.  PSYCHIATRY: No anxiety or depression.   MEDICATIONS AT HOME:   Prior to Admission medications   Medication Sig Start Date End Date Taking? Authorizing Provider  clobetasol (TEMOVATE) 0.05 % external solution Apply 1 application topically as needed.  12/22/12  Yes Historical Provider, MD  montelukast (SINGULAIR) 10 MG  tablet Take 1 tablet by mouth daily. At bedtime. 10/23/12  Yes Historical Provider, MD  VENTOLIN HFA 108 (90 BASE) MCG/ACT inhaler Inhale 2 puffs into the lungs every 4 (four) hours as needed.  01/28/13  Yes Historical Provider, MD      VITAL SIGNS:  Blood pressure (!) 166/84, pulse 67, temperature 98.2 F (36.8 C), temperature source Oral, resp. rate 16, height 5\' 10"  (1.778 m), weight 98.4 kg (217 lb), SpO2 96 %.  PHYSICAL  EXAMINATION:  GENERAL:  61 y.o.-year-old patient lying in the bed with no acute distress.  EYES: Pupils equal, round, reactive to light and accommodation. No scleral icterus. Extraocular muscles intact.  HEENT: Head atraumatic, normocephalic. Oropharynx and nasopharynx clear.  NECK:  Supple, no jugular venous distention. No thyroid enlargement, no tenderness.  LUNGS: Normal breath sounds bilaterally, no wheezing, rales,rhonchi or crepitation. No use of accessory muscles of respiration.  CARDIOVASCULAR: S1, S2 normal. No murmurs, rubs, or gallops. No anterior chest wall tenderness on palpation  ABDOMEN: Soft, nontender, nondistended. Bowel sounds present. No organomegaly or mass.  EXTREMITIES: No pedal edema, cyanosis, or clubbing.  NEUROLOGIC: Cranial nerves II through XII are intact. Muscle strength 5/5 in all extremities. Sensation intact. Gait not checked.  PSYCHIATRIC: The patient is alert and oriented x 3.  SKIN: No obvious rash, lesion, or ulcer.   LABORATORY PANEL:   CBC  Recent Labs Lab 05/03/16 1743  WBC 4.5  HGB 14.5  HCT 41.1  PLT 183   ------------------------------------------------------------------------------------------------------------------  Chemistries   Recent Labs Lab 05/03/16 1743  NA 137  K 3.6  CL 103  CO2 28  GLUCOSE 167*  BUN 14  CREATININE 1.33*  CALCIUM 9.1  AST 22  ALT 17  ALKPHOS 68  BILITOT 0.7   ------------------------------------------------------------------------------------------------------------------  Cardiac Enzymes  Recent Labs Lab 05/03/16 1743  TROPONINI 0.04*   ------------------------------------------------------------------------------------------------------------------  RADIOLOGY:  No results found.  EKG:   Orders placed or performed during the hospital encounter of 05/03/16  . EKG 12-Lead  . EKG 12-Lead  . ED EKG within 10 minutes  . ED EKG within 10 minutes    IMPRESSION AND PLAN:   [name]  is a 61 y.o. male with a known history ofAsthma and chronic lower extremity edema is present into the ED with a chief complaint of intermittent episodes of chest pain which has been getting worse. Patient started having chest pain episode starting from Thursday at 9 AM. He has multiple episodes of chest pains with exertion, each episode lasting approximately 10-15 minutes which are getting spontaneously resolved if he rests yesterday afternoon patient had crushing chest pain in the middle of the chest radiating to both shoulders associated with shortness of breath, diaphoresis and  became pale according to the wife.  # UNSTABLE Angina Admit patient to telemetry Cycle cardiac biomarkers, initial troponin at .04 Get  echocardiogram Cardiology consult is placed, Dr. Fletcher Anon cardiology is aware ED physician Dr. Karma Greaser has discussed with him; Patient was given aspirin and will continue the same Heparin gtt started per cardio recommendations Metoprolol and statin are added to the regimen Sublingual nitroglycerin as needed for chest pain LDL - 85  Gentle hydration with IV fluids   #Elevated blood pressure with no diagnosis of hypertension Patient is started on metoprolol will titrate as needed  #History of asthma Continue his home medications Singulair and albuterol inhaler as needed  #Obesity Patient needs lifestyle modifications once he is clinically stable  #chronic lower extremity edema  continue compression stockings and get echocardiogram  GI prophylaxis with Protonix  All the records are reviewed and case discussed with ED provider. Management plans discussed with the patient, family and they are in agreement.  CODE STATUS: fc , wife is HCPOA  TOTAL TIME TAKING CARE OF THIS PATIENT: 43  minutes.   Note: This dictation was prepared with Dragon dictation along with smaller phrase technology. Any transcriptional errors that result from this process are  unintentional.  Nicholes Mango M.D on 05/03/2016 at 6:57 PM  Between 7am to 6pm - Pager - [phone]  After 6pm go to [email or web address] - password [email or web address] [place]  Office  [phone]  CC: Primary care physician; Albina Billet, MD

## 2016-05-03 NOTE — ED Provider Notes (Addendum)
[facility] Emergency Department Provider Note  ____________________________________________   First MD Initiated Contact with Patient 05/03/16 1744     (approximate)  I have reviewed the triage vital signs and the nursing notes.   HISTORY  Chief Complaint Chest Pain    HPI Alan Fernandez is a 61 y.o. male with no significant past medical history including no major cardiac risk factors except for prior tobacco abuse.  He presents per private vehicle today for evaluation of episodic chest pain that has been gradually worsening for the past 4-5 days.  He states that initially it was rare but now it happens any time he even gets up to walk across the room.  He describes it as a severe sharp and heavy pain in the center of the left side of his chest.  It is nonradiating.  He has no shortness of breath with the pain.  It gets better with rest and worse with exertion.  He denies diaphoresis, nausea, vomiting.  He has never had similar symptoms in the past.  He went to see his primary care doctor, Dr. Hall Busing, earlier today and was sent directly to the emergency department for further evaluation.  Upon arrival I was brought his triage EKG which is concerning for acute ischemia but does not represent a STEMI.  See hospital course for further details.   Past Medical History:  Diagnosis Date  . Asthma   . Eczema   . Hemorrhoid   . Pneumonia Feb 2016    Patient Active Problem List   Diagnosis Date Noted  . Unstable angina ([place]) 05/03/2016  . Venous insufficiency 08/09/2013    Past Surgical History:  Procedure Laterality Date  . COLONOSCOPY  2013  . disk in neck  2003  . [place]  . LAPAROSCOPY N/A 12/09/2014   Procedure: DIAGNOSTIC LAPAROSCOPY AND LAPAROTOMY;  Surgeon: Christene Lye, MD;  Location: [facility] ORS;  Service: General;  Laterality: N/A;  . LYMPH NODE BIOPSY  05-08-15   NON-NECROTIZING GRANULOMATOUS LYMPHADENITIS,   .  TONSILLECTOMY  1965    Prior to Admission medications   Medication Sig Start Date End Date Taking? Authorizing Provider  clobetasol (TEMOVATE) 0.05 % external solution Apply 1 application topically as needed.  12/22/12  Yes Historical Provider, MD  montelukast (SINGULAIR) 10 MG tablet Take 1 tablet by mouth daily. At bedtime. 10/23/12  Yes Historical Provider, MD  VENTOLIN HFA 108 (90 BASE) MCG/ACT inhaler Inhale 2 puffs into the lungs every 4 (four) hours as needed.  01/28/13  Yes Historical Provider, MD    Allergies Adhesive [tape]  No family history on file.  Social History Social History  Substance Use Topics  . Smoking status: Former Smoker    Quit date: 12/05/2004  . Smokeless tobacco: Never Used  . Alcohol use 4.8 oz/week    8 Cans of beer per week     Comment: 4-5 beers per week    Review of Systems Constitutional: No fever/chills Eyes: No visual changes. ENT: No sore throat. Cardiovascular: Episodic chest pain for several days Respiratory: Denies shortness of breath. Gastrointestinal: No abdominal pain.  No nausea, no vomiting.  No diarrhea.  No constipation. Genitourinary: Negative for dysuria. Musculoskeletal: Negative for back pain. Skin: Negative for rash. Neurological: Negative for headaches, focal weakness or numbness.  10-point ROS otherwise negative.  ____________________________________________   PHYSICAL EXAM:  VITAL SIGNS: ED Triage Vitals  Enc Vitals Group     BP 05/03/16 1740 (!) 187/88  Pulse Rate 05/03/16 1740 77     Resp 05/03/16 1740 16     Temp 05/03/16 1740 98.2 F (36.8 C)     Temp Source 05/03/16 1740 Oral     SpO2 05/03/16 1740 97 %     Weight 05/03/16 1741 217 lb (98.4 kg)     Height 05/03/16 1741 5\' 10"  (1.778 m)     Head Circumference --      Peak Flow --      Pain Score 05/03/16 1742 0     Pain Loc --      Pain Edu? --      Excl. in [place]? --     Constitutional: Alert and oriented. Well appearing and in no acute  distress. Eyes: Conjunctivae are normal. PERRL. EOMI. Head: Atraumatic. Nose: No congestion/rhinnorhea. Mouth/Throat: Mucous membranes are moist. Neck: No stridor.  No meningeal signs.   Cardiovascular: Normal rate, regular rhythm. Good peripheral circulation. Grossly normal heart sounds. Respiratory: Normal respiratory effort.  No retractions. Lungs CTAB. Gastrointestinal: Soft and nontender. No distention.  Musculoskeletal: No lower extremity tenderness nor edema. No gross deformities of extremities. Neurologic:  Normal speech and language. No gross focal neurologic deficits are appreciated.  Skin:  Skin is warm, dry and intact. No rash noted.   ____________________________________________   LABS (all labs ordered are listed, but only abnormal results are displayed)  Labs Reviewed  BASIC METABOLIC PANEL - Abnormal; Notable for the following:       Result Value   Glucose, Bld 167 (*)    Creatinine, Ser 1.33 (*)    GFR calc non Af Amer 56 (*)    All other components within normal limits  TROPONIN I - Abnormal; Notable for the following:    Troponin I 0.04 (*)    All other components within normal limits  LIPID PANEL - Abnormal; Notable for the following:    Triglycerides 231 (*)    HDL 38 (*)    VLDL 46 (*)    All other components within normal limits  CBC  PROTIME-INR  APTT  DIFFERENTIAL  HEPATIC FUNCTION PANEL  HEPARIN LEVEL (UNFRACTIONATED)  HIV ANTIBODY (ROUTINE TESTING)  LIPID PANEL   ____________________________________________  EKG  ED ECG REPORT I, Symiah Nowotny, the attending physician, personally viewed and interpreted this ECG.  Date: 05/03/2016 EKG Time: 17:37 Rate: 78 Rhythm: normal sinus rhythm QRS Axis: Right axis deviation Intervals: normal ST/T Wave abnormalities: significant ST-segment elevation in aVR with minimal isolated elevation (1-mm) in III.  Approx 1-2 mm ST depression in leads V3-V6.  Concerning for inferior ischemia with lateral  reciprocal changes Conduction Disturbances: none Narrative Interpretation: probable acute ischemia but does NOT meet STEMI criteria  ____________________________________________  RADIOLOGY   No results found.  ____________________________________________   PROCEDURES  Procedure(s) performed:   .Critical Care Performed by: Hinda Kehr Authorized by: Hinda Kehr   Critical care provider statement:    Critical care time (minutes):  30   Critical care time was exclusive of:  Separately billable procedures and treating other patients   Critical care was necessary to treat or prevent imminent or life-threatening deterioration of the following conditions:  Cardiac failure and circulatory failure   Critical care was time spent personally by me on the following activities:  Development of treatment plan with patient or surrogate, discussions with consultants, evaluation of patient's response to treatment, examination of patient, obtaining history from patient or surrogate, ordering and performing treatments and interventions, ordering and review of laboratory studies, ordering  and review of radiographic studies, pulse oximetry, re-evaluation of patient's condition and review of old [place] performed: Yes, see critical care procedure note(s) ____________________________________________   INITIAL IMPRESSION / ASSESSMENT AND PLAN / ED COURSE  Pertinent labs & imaging results that were available during my care of the patient were reviewed by me and considered in my medical decision making (see chart for details).     Clinical Course as of May 04 2054  Mon May 03, 2016  1751 EKG concerning for acute ischemia, but does not quite meet STEMI criteria.  Ordering everything on STEMI protocol except the actual "Call Code STEMI" order.  Paged Dr. Fletcher Anon (covering the cath lab) to discuss.  [CF]  [id number] Spoke with Dr. Fletcher Anon who agrees that the EKG does not meet STEMI  criteria.  He agreed with my plan for ASA and heparin and admission.   [CF]  1809 I updated the patient and asked him and his wife to please tell me if he has another episode of chest pain so we can repeat the EKG.  Spoke with Dr. Darvin Neighbours the hospitalist to admit, though labs are still pending.  [CF]    Clinical Course User Index [CF] Hinda Kehr, MD    ____________________________________________  FINAL CLINICAL IMPRESSION(S) / ED DIAGNOSES  Final diagnoses:  Unstable angina ([place])  Elevated troponin I level       MEDICATIONS GIVEN DURING THIS VISIT:  Medications  0.9 %  sodium chloride infusion (not administered)  heparin ADULT infusion 100 units/mL (25000 units/247mL sodium chloride 0.45%) (1,100 Units/hr Intravenous Transfusing/Transfer 05/03/16 1946)  albuterol (PROVENTIL) (2.5 MG/3ML) 0.083% nebulizer solution 2.5 mg (not administered)  montelukast (SINGULAIR) tablet 10 mg (not administered)  acetaminophen (TYLENOL) tablet 650 mg (not administered)  ondansetron (ZOFRAN) injection 4 mg (not administered)  morphine 4 MG/ML injection 2 mg (not administered)  gi cocktail (Maalox,Lidocaine,Donnatal) (not administered)  atorvastatin (LIPITOR) tablet 40 mg (not administered)  nitroGLYCERIN (NITROSTAT) SL tablet 0.4 mg (not administered)  metoprolol tartrate (LOPRESSOR) tablet 25 mg (not administered)  pantoprazole (PROTONIX) EC tablet 40 mg (40 mg Oral Given 05/03/16 1917)  aspirin chewable tablet 324 mg (324 mg Oral Given 05/03/16 1753)  heparin injection 4,000 Units (4,000 Units Intravenous Given 05/03/16 1757)     NEW OUTPATIENT MEDICATIONS STARTED DURING THIS VISIT:  Current Discharge Medication List      Current Discharge Medication List      Current Discharge Medication List       Note:  This document was prepared using Dragon voice recognition software and may include unintentional dictation errors.    Hinda Kehr, MD 05/03/16 2056    Hinda Kehr,  MD 05/03/16 [phone]

## 2016-05-03 NOTE — ED Notes (Signed)
Dr. Karma Greaser is aware of pt's troponin

## 2016-05-03 NOTE — ED Notes (Signed)
Pt stating that he does have any CP at this time. Pt in NAD. Pt stating pain is available when he is up and walking. Pt is denying SOB with the CP

## 2016-05-03 NOTE — ED Notes (Signed)
Pt is resting comfortably at this time. Pt in NAD and has no complaints of pain at this time.

## 2016-05-03 NOTE — Progress Notes (Signed)
ANTICOAGULATION CONSULT NOTE - Initial Consult  Pharmacy Consult for Heparin Drip dosing and monitoring Indication: chest pain/ACS  Allergies  Allergen Reactions  . Adhesive [Tape] Other (See Comments)    Pulls skin off. Paper tape is OK to use as long as it isn't too tight.    Patient Measurements: Height: 5\' 10"  (177.8 cm) Weight: 217 lb (98.4 kg) IBW/kg (Calculated) : 73  Vital Signs: Temp: 98.2 F (36.8 C) (03/12 1740) Temp Source: Oral (03/12 1740) BP: 188/89 (03/12 1800) Pulse Rate: 75 (03/12 1800)  Labs:  Recent Labs  05/03/16 1743  HGB 14.5  HCT 41.1  PLT 183  APTT 29  LABPROT 13.0  INR 0.98    CrCl cannot be calculated (Patient's most recent lab result is older than the maximum 21 days allowed.).   Medical History: Past Medical History:  Diagnosis Date  . Asthma   . Eczema   . Hemorrhoid   . Pneumonia Feb 2016    Assessment: 61 yo male with chest pain for 4-5 days. Pharmacy consulted for heparin dosing and monitoring.   Goal of Therapy:  Heparin level 0.3-0.7 units/ml Monitor platelets by anticoagulation protocol: Yes   Plan:  Baseline labs orders Give 4000 units bolus x 1 Start heparin infusion at 1100 units/hr Check anti-Xa level in 6 hours and daily while on heparin Continue to monitor H&H and platelets  Pernell Dupre, PharmD, BCPS Clinical Pharmacist 05/03/2016 6:24 PM

## 2016-05-04 ENCOUNTER — Observation Stay: Payer: 59

## 2016-05-04 ENCOUNTER — Encounter: Payer: Self-pay | Admitting: Physician Assistant

## 2016-05-04 ENCOUNTER — Inpatient Hospital Stay (HOSPITAL_COMMUNITY)
Admission: AD | Admit: 2016-05-04 | Discharge: 2016-05-10 | DRG: 236 | Disposition: A | Discharge status: Home / Self Care | Payer: 59 | Source: Other Acute Inpatient Hospital | Attending: Surgery | Admitting: Surgery

## 2016-05-04 ENCOUNTER — Inpatient Hospital Stay (HOSPITAL_COMMUNITY): Payer: 59

## 2016-05-04 ENCOUNTER — Other Ambulatory Visit: Payer: Self-pay | Admitting: *Deleted

## 2016-05-04 ENCOUNTER — Encounter
Admission: EM | Disposition: A | Discharge status: Other Acute Inpatient Hospital | Payer: Self-pay | Source: Home / Self Care | Attending: Emergency Medicine

## 2016-05-04 ENCOUNTER — Observation Stay (HOSPITAL_COMMUNITY)
Admit: 2016-05-04 | Discharge: 2016-05-04 | Disposition: A | Discharge status: Home / Self Care | Payer: 59 | Attending: Internal Medicine | Admitting: Internal Medicine

## 2016-05-04 DIAGNOSIS — G4700 Insomnia, unspecified: Secondary | ICD-10-CM | POA: Diagnosis not present

## 2016-05-04 DIAGNOSIS — I25110 Atherosclerotic heart disease of native coronary artery with unstable angina pectoris: Secondary | ICD-10-CM | POA: Diagnosis present

## 2016-05-04 DIAGNOSIS — N182 Chronic kidney disease, stage 2 (mild): Secondary | ICD-10-CM | POA: Diagnosis present

## 2016-05-04 DIAGNOSIS — R079 Chest pain, unspecified: Secondary | ICD-10-CM

## 2016-05-04 DIAGNOSIS — E876 Hypokalemia: Secondary | ICD-10-CM

## 2016-05-04 DIAGNOSIS — Z6830 Body mass index (BMI) 30.0-30.9, adult: Secondary | ICD-10-CM | POA: Diagnosis not present

## 2016-05-04 DIAGNOSIS — I872 Venous insufficiency (chronic) (peripheral): Secondary | ICD-10-CM | POA: Diagnosis present

## 2016-05-04 DIAGNOSIS — R739 Hyperglycemia, unspecified: Secondary | ICD-10-CM

## 2016-05-04 DIAGNOSIS — J45909 Unspecified asthma, uncomplicated: Secondary | ICD-10-CM | POA: Diagnosis present

## 2016-05-04 DIAGNOSIS — R0982 Postnasal drip: Secondary | ICD-10-CM | POA: Diagnosis not present

## 2016-05-04 DIAGNOSIS — Z01810 Encounter for preprocedural cardiovascular examination: Secondary | ICD-10-CM

## 2016-05-04 DIAGNOSIS — I200 Unstable angina: Secondary | ICD-10-CM

## 2016-05-04 DIAGNOSIS — I4891 Unspecified atrial fibrillation: Secondary | ICD-10-CM | POA: Diagnosis not present

## 2016-05-04 DIAGNOSIS — I214 Non-ST elevation (NSTEMI) myocardial infarction: Secondary | ICD-10-CM | POA: Diagnosis present

## 2016-05-04 DIAGNOSIS — E669 Obesity, unspecified: Secondary | ICD-10-CM | POA: Diagnosis present

## 2016-05-04 DIAGNOSIS — Z87891 Personal history of nicotine dependence: Secondary | ICD-10-CM | POA: Diagnosis not present

## 2016-05-04 DIAGNOSIS — Z951 Presence of aortocoronary bypass graft: Secondary | ICD-10-CM

## 2016-05-04 DIAGNOSIS — I2510 Atherosclerotic heart disease of native coronary artery without angina pectoris: Secondary | ICD-10-CM

## 2016-05-04 DIAGNOSIS — I10 Essential (primary) hypertension: Secondary | ICD-10-CM

## 2016-05-04 DIAGNOSIS — I129 Hypertensive chronic kidney disease with stage 1 through stage 4 chronic kidney disease, or unspecified chronic kidney disease: Secondary | ICD-10-CM | POA: Diagnosis not present

## 2016-05-04 DIAGNOSIS — I252 Old myocardial infarction: Secondary | ICD-10-CM

## 2016-05-04 DIAGNOSIS — Z8249 Family history of ischemic heart disease and other diseases of the circulatory system: Secondary | ICD-10-CM

## 2016-05-04 DIAGNOSIS — K219 Gastro-esophageal reflux disease without esophagitis: Secondary | ICD-10-CM | POA: Diagnosis not present

## 2016-05-04 HISTORY — PX: LEFT HEART CATH AND CORONARY ANGIOGRAPHY: CATH118249

## 2016-05-04 HISTORY — DX: Atherosclerotic heart disease of native coronary artery without angina pectoris: I25.10

## 2016-05-04 HISTORY — DX: Localized edema: R60.0

## 2016-05-04 HISTORY — DX: Gastro-esophageal reflux disease without esophagitis: K21.9

## 2016-05-04 LAB — LIPID PANEL
CHOL/HDL RATIO: 4.4 ratio
Cholesterol: 158 mg/dL (ref 0–200)
HDL: 36 mg/dL — AB (ref >40)
LDL CALC: 108 mg/dL — AB (ref 0–99)
Triglycerides: 71 mg/dL (ref <150)
VLDL: 14 mg/dL (ref 0–40)

## 2016-05-04 LAB — URINALYSIS, ROUTINE W REFLEX MICROSCOPIC
BILIRUBIN URINE: NEGATIVE
Glucose, UA: NEGATIVE mg/dL
Hgb urine dipstick: NEGATIVE
Ketones, ur: NEGATIVE mg/dL
Leukocytes, UA: NEGATIVE
NITRITE: NEGATIVE
PH: 7 (ref 5.0–8.0)
Protein, ur: NEGATIVE mg/dL
SPECIFIC GRAVITY, URINE: 1.024 (ref 1.005–1.030)

## 2016-05-04 LAB — BLOOD GAS, ARTERIAL
Acid-base deficit: 3 mmol/L — ABNORMAL HIGH (ref 0.0–2.0)
Bicarbonate: 20.7 mmol/L (ref 20.0–28.0)
Drawn by: 398661
FIO2: 21
O2 SAT: 95 %
PCO2 ART: 32.6 mmHg (ref 32.0–48.0)
PH ART: 7.419 (ref 7.350–7.450)
PO2 ART: 79.1 mmHg — AB (ref 83.0–108.0)
Patient temperature: 98.6

## 2016-05-04 LAB — SURGICAL PCR SCREEN
MRSA, PCR: NEGATIVE
STAPHYLOCOCCUS AUREUS: NEGATIVE

## 2016-05-04 LAB — CBC
HCT: 38.2 % — ABNORMAL LOW (ref 40.0–52.0)
Hemoglobin: 13.7 g/dL (ref 13.0–18.0)
MCH: 32.7 pg (ref 26.0–34.0)
MCHC: 35.9 g/dL (ref 32.0–36.0)
MCV: 91 fL (ref 80.0–100.0)
PLATELETS: 171 10*3/uL (ref 150–440)
RBC: 4.19 MIL/uL — ABNORMAL LOW (ref 4.40–5.90)
RDW: 14.1 % (ref 11.5–14.5)
WBC: 4.6 10*3/uL (ref 3.8–10.6)

## 2016-05-04 LAB — TYPE AND SCREEN
ABO/RH(D): A POS
ANTIBODY SCREEN: NEGATIVE

## 2016-05-04 LAB — ECHOCARDIOGRAM COMPLETE
Height: 70 in
Weight: 3424 oz

## 2016-05-04 LAB — HEPARIN LEVEL (UNFRACTIONATED)
HEPARIN UNFRACTIONATED: 0.43 [IU]/mL (ref 0.30–0.70)
Heparin Unfractionated: 0.43 IU/mL (ref 0.30–0.70)

## 2016-05-04 LAB — ABO/RH: ABO/RH(D): A POS

## 2016-05-04 LAB — TROPONIN I
TROPONIN I: 0.03 ng/mL — AB (ref <0.03)
Troponin I: 0.03 ng/mL (ref <0.03)

## 2016-05-04 LAB — MAGNESIUM: Magnesium: 2 mg/dL (ref 1.7–2.4)

## 2016-05-04 SURGERY — LEFT HEART CATH AND CORONARY ANGIOGRAPHY
Anesthesia: Moderate Sedation

## 2016-05-04 MED ORDER — DOPAMINE-DEXTROSE 3.2-5 MG/ML-% IV SOLN
0.0000 ug/kg/min | INTRAVENOUS | Status: AC
  Administered 2016-05-05: 3 ug/kg/min via INTRAVENOUS
  Filled 2016-05-04: qty 250

## 2016-05-04 MED ORDER — MAGNESIUM SULFATE 50 % IJ SOLN
40.0000 meq | INTRAMUSCULAR | Status: DC
  Filled 2016-05-04: qty 10

## 2016-05-04 MED ORDER — POTASSIUM CHLORIDE 2 MEQ/ML IV SOLN
80.0000 meq | INTRAVENOUS | Status: DC
  Filled 2016-05-04: qty 40

## 2016-05-04 MED ORDER — POTASSIUM CHLORIDE CRYS ER 20 MEQ PO TBCR
30.0000 meq | EXTENDED_RELEASE_TABLET | Freq: Once | ORAL | Status: AC
  Administered 2016-05-04: 30 meq via ORAL
  Filled 2016-05-04: qty 3

## 2016-05-04 MED ORDER — SODIUM CHLORIDE 0.9 % IV BOLUS (SEPSIS)
500.0000 mL | Freq: Once | INTRAVENOUS | Status: AC
  Administered 2016-05-04: 500 mL via INTRAVENOUS

## 2016-05-04 MED ORDER — VERAPAMIL HCL 2.5 MG/ML IV SOLN
INTRAVENOUS | Status: AC
Start: 2016-05-04 — End: 2016-05-04
  Filled 2016-05-04: qty 2

## 2016-05-04 MED ORDER — SODIUM CHLORIDE 0.9 % WEIGHT BASED INFUSION: 3.0000 mL/kg/h | INTRAVENOUS | Status: DC

## 2016-05-04 MED ORDER — CHLORHEXIDINE GLUCONATE 0.12 % MT SOLN
15.0000 mL | Freq: Once | OROMUCOSAL | Status: AC
  Administered 2016-05-05: 15 mL via OROMUCOSAL
  Filled 2016-05-04: qty 15

## 2016-05-04 MED ORDER — SODIUM CHLORIDE 0.9% FLUSH
3.0000 mL | Freq: Two times a day (BID) | INTRAVENOUS | Status: DC
  Administered 2016-05-04: 3 mL via INTRAVENOUS

## 2016-05-04 MED ORDER — PLASMA-LYTE 148 IV SOLN
INTRAVENOUS | Status: AC
  Administered 2016-05-05: 500 mL
  Filled 2016-05-04: qty 2.5

## 2016-05-04 MED ORDER — EPINEPHRINE PF 1 MG/ML IJ SOLN
0.0000 ug/min | INTRAVENOUS | Status: DC
  Filled 2016-05-04: qty 4

## 2016-05-04 MED ORDER — NITROGLYCERIN IN D5W 200-5 MCG/ML-% IV SOLN
2.0000 ug/min | INTRAVENOUS | Status: AC
  Administered 2016-05-05: 20 ug/min via INTRAVENOUS
  Filled 2016-05-04: qty 250

## 2016-05-04 MED ORDER — DIPHENHYDRAMINE HCL 50 MG/ML IJ SOLN
INTRAMUSCULAR | Status: AC
  Administered 2016-05-04: 11:00:00
  Filled 2016-05-04: qty 1

## 2016-05-04 MED ORDER — DEXTROSE 5 % IV SOLN
750.0000 mg | INTRAVENOUS | Status: DC
  Filled 2016-05-04: qty 750

## 2016-05-04 MED ORDER — TRANEXAMIC ACID (OHS) PUMP PRIME SOLUTION
2.0000 mg/kg | INTRAVENOUS | Status: DC
  Filled 2016-05-04: qty 1.94

## 2016-05-04 MED ORDER — METOPROLOL TARTRATE 25 MG PO TABS: 25.0000 mg | ORAL_TABLET | Freq: Two times a day (BID) | ORAL | Status: DC

## 2016-05-04 MED ORDER — SODIUM CHLORIDE 0.9 % IV SOLN
INTRAVENOUS | Status: AC
  Administered 2016-05-05: 1.3 [IU]/h via INTRAVENOUS
  Filled 2016-05-04: qty 2.5

## 2016-05-04 MED ORDER — FAMOTIDINE 20 MG PO TABS
20.0000 mg | ORAL_TABLET | Freq: Once | ORAL | Status: AC
  Administered 2016-05-04: 20 mg via ORAL

## 2016-05-04 MED ORDER — HEPARIN (PORCINE) IN NACL 100-0.45 UNIT/ML-% IJ SOLN: 1100.0000 [IU]/h | INTRAMUSCULAR | Status: DC

## 2016-05-04 MED ORDER — ALPRAZOLAM 0.25 MG PO TABS: 0.2500 mg | ORAL_TABLET | ORAL | Status: DC | PRN

## 2016-05-04 MED ORDER — SODIUM CHLORIDE 0.9 % IV SOLN: 250.0000 mL | INTRAVENOUS | Status: DC | PRN

## 2016-05-04 MED ORDER — METHYLPREDNISOLONE SODIUM SUCC 125 MG IJ SOLR: 125.0000 mg | Freq: Once | INTRAMUSCULAR | Status: DC

## 2016-05-04 MED ORDER — DIAZEPAM 5 MG PO TABS
5.0000 mg | ORAL_TABLET | Freq: Once | ORAL | Status: DC
  Filled 2016-05-04: qty 1

## 2016-05-04 MED ORDER — FAMOTIDINE IN NACL 20-0.9 MG/50ML-% IV SOLN
20.0000 mg | Freq: Once | INTRAVENOUS | Status: DC
  Filled 2016-05-04: qty 50

## 2016-05-04 MED ORDER — HEPARIN SODIUM (PORCINE) 1000 UNIT/ML IJ SOLN
INTRAMUSCULAR | Status: AC
  Filled 2016-05-04: qty 1

## 2016-05-04 MED ORDER — PNEUMOCOCCAL VAC POLYVALENT 25 MCG/0.5ML IJ INJ
0.5000 mL | INJECTION | INTRAMUSCULAR | Status: DC
  Filled 2016-05-04: qty 0.5

## 2016-05-04 MED ORDER — METOPROLOL TARTRATE 12.5 MG HALF TABLET
12.5000 mg | ORAL_TABLET | Freq: Once | ORAL | Status: AC
  Administered 2016-05-05: 12.5 mg via ORAL
  Filled 2016-05-04: qty 1

## 2016-05-04 MED ORDER — ASPIRIN 81 MG PO CHEW
CHEWABLE_TABLET | ORAL | Status: AC
  Administered 2016-05-04: 81 mg via ORAL
  Filled 2016-05-04: qty 1

## 2016-05-04 MED ORDER — HEPARIN (PORCINE) IN NACL 100-0.45 UNIT/ML-% IJ SOLN
1100.0000 [IU]/h | INTRAMUSCULAR | Status: DC
  Administered 2016-05-04: 1100 [IU]/h via INTRAVENOUS
  Filled 2016-05-04: qty 250

## 2016-05-04 MED ORDER — CHLORHEXIDINE GLUCONATE CLOTH 2 % EX PADS
6.0000 | MEDICATED_PAD | Freq: Once | CUTANEOUS | Status: AC
  Administered 2016-05-04: 6 via TOPICAL

## 2016-05-04 MED ORDER — FENTANYL CITRATE (PF) 100 MCG/2ML IJ SOLN
INTRAMUSCULAR | Status: AC
  Filled 2016-05-04: qty 2

## 2016-05-04 MED ORDER — TRANEXAMIC ACID (OHS) BOLUS VIA INFUSION
15.0000 mg/kg | INTRAVENOUS | Status: AC
  Administered 2016-05-05: 1456.5 mg via INTRAVENOUS
  Filled 2016-05-04: qty 1457

## 2016-05-04 MED ORDER — ATORVASTATIN CALCIUM 40 MG PO TABS: 40.0000 mg | ORAL_TABLET | Freq: Every day | ORAL | Status: DC

## 2016-05-04 MED ORDER — SODIUM CHLORIDE 0.9 % IV SOLN
30.0000 ug/min | INTRAVENOUS | Status: AC
  Administered 2016-05-05: 20 ug/min via INTRAVENOUS
  Filled 2016-05-04: qty 2

## 2016-05-04 MED ORDER — SODIUM CHLORIDE 0.9 % WEIGHT BASED INFUSION: 1.0000 mL/kg/h | INTRAVENOUS | Status: DC

## 2016-05-04 MED ORDER — IOPAMIDOL (ISOVUE-300) INJECTION 61%
INTRAVENOUS | Status: DC | PRN
  Administered 2016-05-04: 65 mL via INTRA_ARTERIAL

## 2016-05-04 MED ORDER — FENTANYL CITRATE (PF) 100 MCG/2ML IJ SOLN
INTRAMUSCULAR | Status: DC | PRN
  Administered 2016-05-04: 50 ug via INTRAVENOUS

## 2016-05-04 MED ORDER — NITROGLYCERIN 0.4 MG SL SUBL: 0.4000 mg | SUBLINGUAL_TABLET | SUBLINGUAL | 12 refills | Status: DC | PRN

## 2016-05-04 MED ORDER — METHYLPREDNISOLONE SODIUM SUCC 125 MG IJ SOLR
INTRAMUSCULAR | Status: AC
  Administered 2016-05-04: 11:00:00
  Filled 2016-05-04: qty 2

## 2016-05-04 MED ORDER — HEPARIN (PORCINE) IN NACL 2-0.9 UNIT/ML-% IJ SOLN
INTRAMUSCULAR | Status: AC
  Filled 2016-05-04: qty 500

## 2016-05-04 MED ORDER — ASPIRIN 81 MG PO CHEW
81.0000 mg | CHEWABLE_TABLET | ORAL | Status: AC
  Administered 2016-05-04: 81 mg via ORAL

## 2016-05-04 MED ORDER — MIDAZOLAM HCL 2 MG/2ML IJ SOLN
INTRAMUSCULAR | Status: DC | PRN
  Administered 2016-05-04: 1 mg via INTRAVENOUS

## 2016-05-04 MED ORDER — FAMOTIDINE 20 MG PO TABS
ORAL_TABLET | ORAL | Status: AC
Start: 2016-05-04 — End: 2016-05-04
  Administered 2016-05-04: 11:00:00
  Filled 2016-05-04: qty 2

## 2016-05-04 MED ORDER — TEMAZEPAM 15 MG PO CAPS: 15.0000 mg | ORAL_CAPSULE | Freq: Once | ORAL | Status: DC | PRN

## 2016-05-04 MED ORDER — CHLORHEXIDINE GLUCONATE CLOTH 2 % EX PADS
6.0000 | MEDICATED_PAD | Freq: Once | CUTANEOUS | Status: AC
  Administered 2016-05-05: 6 via TOPICAL

## 2016-05-04 MED ORDER — DEXMEDETOMIDINE HCL IN NACL 400 MCG/100ML IV SOLN
0.1000 ug/kg/h | INTRAVENOUS | Status: AC
  Administered 2016-05-05: .3 ug/kg/h via INTRAVENOUS
  Filled 2016-05-04: qty 100

## 2016-05-04 MED ORDER — VANCOMYCIN HCL 10 G IV SOLR
1250.0000 mg | INTRAVENOUS | Status: AC
  Administered 2016-05-05: 1250 mg via INTRAVENOUS
  Filled 2016-05-04: qty 1250

## 2016-05-04 MED ORDER — SODIUM CHLORIDE 0.9 % IV SOLN
1.5000 mg/kg/h | INTRAVENOUS | Status: AC
  Administered 2016-05-05: 1.5 mg/kg/h via INTRAVENOUS
  Filled 2016-05-04: qty 25

## 2016-05-04 MED ORDER — SODIUM CHLORIDE 0.9 % IV SOLN
INTRAVENOUS | Status: DC
  Filled 2016-05-04: qty 30

## 2016-05-04 MED ORDER — DEXTROSE 5 % IV SOLN
1.5000 g | INTRAVENOUS | Status: AC
  Administered 2016-05-05: 1500 mg via INTRAVENOUS
  Administered 2016-05-05: 750 mg via INTRAVENOUS
  Filled 2016-05-04: qty 1.5

## 2016-05-04 MED ORDER — SODIUM CHLORIDE 0.9% FLUSH
3.0000 mL | INTRAVENOUS | Status: DC | PRN
Start: 2016-05-04 — End: 2016-05-04

## 2016-05-04 MED ORDER — DIPHENHYDRAMINE HCL 50 MG/ML IJ SOLN: 50.0000 mg | Freq: Once | INTRAMUSCULAR | Status: DC

## 2016-05-04 MED ORDER — MIDAZOLAM HCL 2 MG/2ML IJ SOLN
INTRAMUSCULAR | Status: AC
  Filled 2016-05-04: qty 2

## 2016-05-04 MED ORDER — BISACODYL 5 MG PO TBEC: 5.0000 mg | DELAYED_RELEASE_TABLET | Freq: Once | ORAL | Status: DC

## 2016-05-04 SURGICAL SUPPLY — 7 items
CATH 5F 110X4 TIG (CATHETERS) ×3 IMPLANT
CATH 5FR PIGTAIL DIAGNOSTIC (CATHETERS) ×3 IMPLANT
DEVICE RAD TR BAND REGULAR (VASCULAR PRODUCTS) ×3 IMPLANT
GLIDESHEATH SLEND SS 6F .021 (SHEATH) ×3 IMPLANT
KIT MANI 3VAL PERCEP (MISCELLANEOUS) ×3 IMPLANT
PACK CARDIAC CATH (CUSTOM PROCEDURE TRAY) ×3 IMPLANT
WIRE ROSEN-J .035X260CM (WIRE) ×3 IMPLANT

## 2016-05-04 NOTE — Progress Notes (Signed)
Report called to [facility], [facility] RN. [facility] said they are on the way.

## 2016-05-04 NOTE — Progress Notes (Signed)
*  PRELIMINARY RESULTS* Echocardiogram 2D Echocardiogram has been performed.  Sherrie Sport 05/04/2016, 1:25 PM

## 2016-05-04 NOTE — Consult Note (Signed)
Cardiology Consultation Note  Patient ID: Alan Fernandez, MRN: [id number], DOB/AGE: Nov 30, 1955 61 y.o. Admit date: 05/03/2016   Date of Consult: 05/04/2016 Primary Physician: Albina Billet, MD Primary Cardiologist: New to [facility] - consult by End Requesting Physician: Dr. Margaretmary Eddy, MD  Chief Complaint: Chest pain Reason for Consult: Unstable angina/elevated troponin  HPI: 61 y.o. 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  Patient has no previously known cardiac history. He has never seen a cardiologist before and has never gone ischemic evaluation. Patient was in his usual state of health up until Thursday, 3/8 when he was ambulating into a store from a parking lot and developed severe substernal chest pain without radiation with associated shortness of breath, diaphoresis, and nausea. Upon getting into the store patient sat down in a chair for approximately 15 minutes with resolution of symptoms. While in the store he had no further symptoms. Patient again experienced exertional chest pain on Friday, 3/9 that improved with rest. On 3/11 patient noted worsening substernal chest pain with exertion that again improved with rest for approximately 15 minutes. This too was associated with shortness of breath, nausea, and diaphoresis. He initially thought all of these episodes were related to either reflux or his asthma. On 3/12 patient was ambulating in his living room and developed substernal chest pain that was characterized as a deep pressure and worse than any prior episode. This pain again was associated with shortness of breath, nausea, diaphoresis. No associated vomiting, palpitations, dizziness, presyncope, or syncope. Patient does report having a similar episode of chest pain just prior to Christmas while  ambulating up a driveway at an incline though he thought this result was related to his asthma. Chest pain lasted for approximately 2.5 hours on 3/2 before self resolution.  Upon the patient's arrival to [facility] they were found to have systolic blood pressure 354 mmHg, heart rate 77 bpm, afebrile, oxygen saturation 97% on room air. Troponin trend has been mildly elevated and flat, at 0.03 with a peak of 0.04 currently down trending to 0.03 this morning. He is currently chest pain-free. He was started on heparin drip by the admitting team. White blood cell count 4.6, hemoglobin 13.7, platelet count 171, LDL 108, normal LFT, serum creatinine 1.3 with a baseline of approximately 1.1-1.3, potassium 3.6. ECG as below, CXR not done in the ER or at time of admission. Currently chest pain-free on heparin drip with systolic blood pressure 656.  Past Medical History:  Diagnosis Date  . Asthma   . Eczema   . Hemorrhoid   . Pneumonia Feb 2016      Most Recent Cardiac Studies: none   Surgical History:  Past Surgical History:  Procedure Laterality Date  . COLONOSCOPY  2013  . disk in neck  2003  . [place]  . LAPAROSCOPY N/A 12/09/2014   Procedure: DIAGNOSTIC LAPAROSCOPY AND LAPAROTOMY;  Surgeon: Christene Lye, MD;  Location: [facility] ORS;  Service: General;  Laterality: N/A;  . LYMPH NODE BIOPSY  05-08-15   NON-NECROTIZING GRANULOMATOUS LYMPHADENITIS,   . TONSILLECTOMY  1965     Home Meds: Prior to Admission medications   Medication Sig Start Date End Date Taking? Authorizing Provider  clobetasol (TEMOVATE) 0.05 % external solution Apply 1 application topically as needed.  12/22/12  Yes Historical Provider,  MD  montelukast (SINGULAIR) 10 MG tablet Take 1 tablet by mouth daily. At bedtime. 10/23/12  Yes Historical Provider, MD  VENTOLIN HFA 108 (90 BASE) MCG/ACT inhaler Inhale 2 puffs into the lungs every 4 (four) hours as needed.  01/28/13  Yes Historical Provider, MD    Inpatient  Medications:  . atorvastatin  40 mg Oral q1800  . metoprolol tartrate  25 mg Oral BID  . montelukast  10 mg Oral Daily  . pantoprazole  40 mg Oral Daily  . sodium chloride  500 mL Intravenous Once   . sodium chloride    . heparin 1,100 Units/hr (05/03/16 1812)    Allergies:  Allergies  Allergen Reactions  . Adhesive [Tape] Other (See Comments)    Pulls skin off. Paper tape is OK to use as long as it isn't too tight.    Social History   Social History  . Marital status: Married    Spouse name: N/A  . Number of children: N/A  . Years of education: N/A   Occupational History  . Not on file.   Social History Main Topics  . Smoking status: Former Smoker    Quit date: 12/05/2004  . Smokeless tobacco: Never Used  . Alcohol use 4.8 oz/week    8 Cans of beer per week     Comment: 4-5 beers per week  . Drug use: No  . Sexual activity: Not on file   Other Topics Concern  . Not on file   Social History Narrative  . No narrative on file     Family History  Problem Relation Age of Onset  . Hypertension Mother   . CAD Father     a. father with MI at 47      Review of Systems: Review of Systems  Constitutional: Positive for diaphoresis and malaise/fatigue. Negative for chills, fever and weight loss.  HENT: Negative for congestion.   Eyes: Negative for discharge and redness.  Respiratory: Positive for shortness of breath. Negative for cough, hemoptysis, sputum production and wheezing.   Cardiovascular: Positive for chest pain. Negative for palpitations, orthopnea, claudication, leg swelling and PND.  Gastrointestinal: Positive for nausea. Negative for abdominal pain, blood in stool, heartburn, melena and vomiting.  Genitourinary: Negative for hematuria.  Musculoskeletal: Negative for falls and myalgias.  Skin: Negative for rash.  Neurological: Positive for weakness. Negative for dizziness, tingling, tremors, sensory change, speech change, focal weakness and loss of  consciousness.  Endo/Heme/Allergies: Does not bruise/bleed easily.  Psychiatric/Behavioral: Negative for substance abuse. The patient is not nervous/anxious.   All other systems reviewed and are negative.   Labs:  Recent Labs  05/03/16 1743 05/04/16 0502 05/04/16 0733  TROPONINI 0.04* 0.03* 0.03*   Lab Results  Component Value Date   WBC 4.6 05/04/2016   HGB 13.7 05/04/2016   HCT 38.2 (L) 05/04/2016   MCV 91.0 05/04/2016   PLT 171 05/04/2016     Recent Labs Lab 05/03/16 1743  NA 137  K 3.6  CL 103  CO2 28  BUN 14  CREATININE 1.33*  CALCIUM 9.1  PROT 6.7  BILITOT 0.7  ALKPHOS 68  ALT 17  AST 22  GLUCOSE 167*   Lab Results  Component Value Date   CHOL 158 05/04/2016   HDL 36 (L) 05/04/2016   LDLCALC 108 (H) 05/04/2016   TRIG 71 05/04/2016   No results found for: DDIMER  Radiology/Studies:  No results found.  EKG: Interpreted by me showed: Initial EKG NSR, 78  bpm, right axis deviation, baseline wandering V4, nonspecific inferolateral st/t changes. EKG this morning sinus bradycardia, 59 bpm, lateral TWI Telemetry: Interpreted by me showed: sinus bradycardia, upper 50s bpm  Weights: Filed Weights   05/03/16 1741 05/03/16 2012  Weight: 217 lb (98.4 kg) 214 lb 4.8 oz (97.2 kg)     Physical Exam: Blood pressure 137/67, pulse 60, temperature 97.5 F (36.4 C), temperature source Oral, resp. rate 16, height 5\' 10"  (1.778 m), weight 214 lb 4.8 oz (97.2 kg), SpO2 97 %. Body mass index is 30.75 kg/m. General: Well developed, well nourished, in no acute distress. Head: Normocephalic, atraumatic, sclera non-icteric, no xanthomas, nares are without discharge.  Neck: Negative for carotid bruits. JVD not elevated. Lungs: Clear bilaterally to auscultation without wheezes, rales, or rhonchi. Breathing is unlabored. Heart: RRR with S1 S2. No murmurs, rubs, or gallops appreciated. Abdomen: Soft, non-tender, non-distended with normoactive bowel sounds. No hepatomegaly.  No rebound/guarding. No obvious abdominal masses. Msk:  Strength and tone appear normal for age. Extremities: No clubbing or cyanosis. No edema. Distal pedal pulses are 2+ and equal bilaterally. Neuro: Alert and oriented X 3. No facial asymmetry. No focal deficit. Moves all extremities spontaneously. Psych:  Responds to questions appropriately with a normal affect.    Assessment and Plan:  Principal Problem:   Unstable angina (HCC) Active Problems:   Accelerated hypertension   CKD (chronic kidney disease), stage II   Hypokalemia   Hyperglycemia    1. Unstable angina: -Currently chest pain-free -Minimal troponin elevation with a peak of 0.04, currently down trending to 0.03 this morning -Patient's story is concerning for unstable angina given the stuttering nature as well as worsening chest pain with exertion -Uncertain if nuclear stress testing would answer the question being closed at this time -We'll schedule patient for left heart catheterization with Dr. end on 3/13 -Risks and benefits of cardiac catheterization have been discussed with the patient including risks of bleeding, bruising, infection, kidney damage, stroke, heart attack, and death. The patient understands these risks and is willing to proceed with the procedure. All questions have been answered and concerns listened to -Heparin drip per pharmacy -Continue ASA, Lopressor, Lipitor -Check echocardiogram -Cardiac rehabilitation recommended  2. Accelerated hypertension: -Currently improved to systolic blood pressure 1 33 mmHg -Continue Lopressor as above -Elevated blood pressure likely in the setting of severe substernal chest pain upon arrival  3. HLD: -Lipitor as above -Will need follow-up lipid liver and 8 weeks as an outpatient  4. CKD stage II: -At his approximate baseline -Avoid LV gram during cardiac cath -Will give 500 cc normal saline bolus this morning in preparation for cardiac catheterization -Will  need close monitoring of renal function status post cardiac cath  5. Hyperglycemia: -Check A1c  6. Hypokalemia: -Replete to goal 4.0 -Check magnesium   Signed, Christell Faith, PA-C [facility] Pager: [phone] 05/04/2016, 8:35 AM

## 2016-05-04 NOTE — Plan of Care (Signed)
Problem: Activity: Goal: Ability to tolerate increased activity will improve Outcome: Progressing Patient ambulating in the room independently, tolerated well. No complaints of pain or shortness of breath.

## 2016-05-04 NOTE — Progress Notes (Addendum)
ANTICOAGULATION CONSULT NOTE - Initial Consult  Pharmacy Consult for Heparin Drip dosing and monitoring Indication: chest pain/ACS  Allergies  Allergen Reactions  . Adhesive [Tape] Other (See Comments)    Pulls skin off. Paper tape is OK to use as long as it isn't too tight.    Patient Measurements: Height: 5\' 10"  (177.8 cm) Weight: 214 lb 4.8 oz (97.2 kg) IBW/kg (Calculated) : 73  Vital Signs: Temp: 97.6 F (36.4 C) (03/13 0844) Temp Source: Oral (03/13 0844) BP: 153/80 (03/13 0844) Pulse Rate: 58 (03/13 0844)  Labs:  Recent Labs  05/03/16 1743 05/04/16 0023 05/04/16 0502 05/04/16 0733  HGB 14.5  --  13.7  --   HCT 41.1  --  38.2*  --   PLT 183  --  171  --   APTT 29  --   --   --   LABPROT 13.0  --   --   --   INR 0.98  --   --   --   HEPARINUNFRC  --  0.43  --  0.43  CREATININE 1.33*  --   --   --   TROPONINI 0.04*  --  0.03* 0.03*    Estimated Creatinine Clearance: 68.2 mL/min (by C-G formula based on SCr of 1.33 mg/dL (H)).   Medical History: Past Medical History:  Diagnosis Date  . Asthma   . Eczema   . Hemorrhoid   . Pneumonia Feb 2016    Assessment: 61 yo male with chest pain for 4-5 days. Pharmacy consulted for heparin dosing and monitoring.   Goal of Therapy:  Heparin level 0.3-0.7 units/ml Monitor platelets by anticoagulation protocol: Yes   Plan:  3/13 0733 - confirmatory HL = 0.43 (therapeutic). Continue heparin drip at current rate of 1100 units/hr. Will recheck HL and CBC with AM labs tomorrow.  Lenis Noon, PharmD, BCPS Clinical Pharmacist 05/04/2016 8:46 AM  Addendum: Patient has cardiac cath this morning and is awaiting transfer to [place] for CABG. Spoke with specials and MD - heparin drip to be resumed 2 hours after TR band removal. TR band is to be removed at 1330.   Orders to restart heparin drip at previous rate of 1100 units/hr beginning at 1530 this afternoon. Will check HL 6 hours into infusion and CBC with AM  labs tomorrow.  Lenis Noon, PharmD 05/04/16 1:31 PM

## 2016-05-04 NOTE — Progress Notes (Signed)
Pre-op Cardiac Surgery  Carotid Findings:  Bilateral: No significant (1-39%) ICA stenosis. Antegrade vertebral flow.    Upper Extremity Right Left  Brachial Pressures 132 134  Radial Waveforms Tri Tri  Ulnar Waveforms Tri Tri  Palmar Arch (Allen's Test) Normal  Normal    Findings:  Pedal artery waveforms within normal limits.  Landry Mellow, RDMS, RVT 05/04/2016

## 2016-05-04 NOTE — Progress Notes (Signed)
ANTICOAGULATION CONSULT NOTE - Initial Consult  Pharmacy Consult for Heparin Indication: chest pain/ACS  Allergies  Allergen Reactions  . Contrast Media [Iodinated Diagnostic Agents] Other (See Comments)    Nausea and throat issues  . Adhesive [Tape] Other (See Comments)    Pulls skin off. Paper tape is OK to use as long as it isn't too tight.    Patient Measurements:    Vital Signs: Temp: 97.6 F (36.4 C) (03/13 0938) Temp Source: Oral (03/13 0938) BP: 124/76 (03/13 1507) Pulse Rate: 64 (03/13 1507)  Labs:  Recent Labs  05/03/16 1743 05/04/16 0023 05/04/16 0502 05/04/16 0733  HGB 14.5  --  13.7  --   HCT 41.1  --  38.2*  --   PLT 183  --  171  --   APTT 29  --   --   --   LABPROT 13.0  --   --   --   INR 0.98  --   --   --   HEPARINUNFRC  --  0.43  --  0.43  CREATININE 1.33*  --   --   --   TROPONINI 0.04*  --  0.03* 0.03*    Estimated Creatinine Clearance: 68.1 mL/min (by C-G formula based on SCr of 1.33 mg/dL (H)).   Medical History: Past Medical History:  Diagnosis Date  . Asthma   . CAD (coronary artery disease)    a. NSTEMI 05/04/2016; b. cardiac cath:  . Eczema   . Edema extremities   . GERD (gastroesophageal reflux disease)   . Hemorrhoid   . Pneumonia Feb 2016    Medications:  Scheduled:  . [START ON 05/05/2016] pneumococcal 23 valent vaccine  0.5 mL Intramuscular Tomorrow-1000    Assessment: 61yo male s/p cath at [facility] and transferred to [facility] for CABG planned for 3/14, to resume heparin.  Previously therapeutic on 1100 units/hr, will resume with no bolus.  TR Band was removed at 1330.  Goal of Therapy:  Heparin level 0.3-0.7 units/ml Monitor platelets by anticoagulation protocol: Yes   Plan:  Heparin 1100 units/hr Heparin level 8hr Daily heparin level, CBC   Gracy Bruins, [facility]

## 2016-05-04 NOTE — H&P (View-Only) (Signed)
Cardiology Consultation Note  Patient ID: ZAIRE LEVESQUE, MRN: [id number], DOB/AGE: January 12, 1956 61 y.o. Admit date: 05/03/2016   Date of Consult: 05/04/2016 Primary Physician: Albina Billet, MD Primary Cardiologist: New to [facility] - consult by End Requesting Physician: Dr. Margaretmary Eddy, MD  Chief Complaint: Chest pain Reason for Consult: Unstable angina/elevated troponin  HPI: 61 y.o. 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  Patient has no previously known cardiac history. He has never seen a cardiologist before and has never gone ischemic evaluation. Patient was in his usual state of health up until Thursday, 3/8 when he was ambulating into a store from a parking lot and developed severe substernal chest pain without radiation with associated shortness of breath, diaphoresis, and nausea. Upon getting into the store patient sat down in a chair for approximately 15 minutes with resolution of symptoms. While in the store he had no further symptoms. Patient again experienced exertional chest pain on Friday, 3/9 that improved with rest. On 3/11 patient noted worsening substernal chest pain with exertion that again improved with rest for approximately 15 minutes. This too was associated with shortness of breath, nausea, and diaphoresis. He initially thought all of these episodes were related to either reflux or his asthma. On 3/12 patient was ambulating in his living room and developed substernal chest pain that was characterized as a deep pressure and worse than any prior episode. This pain again was associated with shortness of breath, nausea, diaphoresis. No associated vomiting, palpitations, dizziness, presyncope, or syncope. Patient does report having a similar episode of chest pain just prior to Christmas while  ambulating up a driveway at an incline though he thought this result was related to his asthma. Chest pain lasted for approximately 2.5 hours on 3/2 before self resolution.  Upon the patient's arrival to [facility] they were found to have systolic blood pressure 063 mmHg, heart rate 77 bpm, afebrile, oxygen saturation 97% on room air. Troponin trend has been mildly elevated and flat, at 0.03 with a peak of 0.04 currently down trending to 0.03 this morning. He is currently chest pain-free. He was started on heparin drip by the admitting team. White blood cell count 4.6, hemoglobin 13.7, platelet count 171, LDL 108, normal LFT, serum creatinine 1.3 with a baseline of approximately 1.1-1.3, potassium 3.6. ECG as below, CXR not done in the ER or at time of admission. Currently chest pain-free on heparin drip with systolic blood pressure 016.  Past Medical History:  Diagnosis Date  . Asthma   . Eczema   . Hemorrhoid   . Pneumonia Feb 2016      Most Recent Cardiac Studies: none   Surgical History:  Past Surgical History:  Procedure Laterality Date  . COLONOSCOPY  2013  . disk in neck  2003  . [place]  . LAPAROSCOPY N/A 12/09/2014   Procedure: DIAGNOSTIC LAPAROSCOPY AND LAPAROTOMY;  Surgeon: Christene Lye, MD;  Location: [facility] ORS;  Service: General;  Laterality: N/A;  . LYMPH NODE BIOPSY  05-08-15   NON-NECROTIZING GRANULOMATOUS LYMPHADENITIS,   . TONSILLECTOMY  1965     Home Meds: Prior to Admission medications   Medication Sig Start Date End Date Taking? Authorizing Provider  clobetasol (TEMOVATE) 0.05 % external solution Apply 1 application topically as needed.  12/22/12  Yes Historical Provider,  MD  montelukast (SINGULAIR) 10 MG tablet Take 1 tablet by mouth daily. At bedtime. 10/23/12  Yes Historical Provider, MD  VENTOLIN HFA 108 (90 BASE) MCG/ACT inhaler Inhale 2 puffs into the lungs every 4 (four) hours as needed.  01/28/13  Yes Historical Provider, MD    Inpatient  Medications:  . atorvastatin  40 mg Oral q1800  . metoprolol tartrate  25 mg Oral BID  . montelukast  10 mg Oral Daily  . pantoprazole  40 mg Oral Daily  . sodium chloride  500 mL Intravenous Once   . sodium chloride    . heparin 1,100 Units/hr (05/03/16 1812)    Allergies:  Allergies  Allergen Reactions  . Adhesive [Tape] Other (See Comments)    Pulls skin off. Paper tape is OK to use as long as it isn't too tight.    Social History   Social History  . Marital status: Married    Spouse name: N/A  . Number of children: N/A  . Years of education: N/A   Occupational History  . Not on file.   Social History Main Topics  . Smoking status: Former Smoker    Quit date: 12/05/2004  . Smokeless tobacco: Never Used  . Alcohol use 4.8 oz/week    8 Cans of beer per week     Comment: 4-5 beers per week  . Drug use: No  . Sexual activity: Not on file   Other Topics Concern  . Not on file   Social History Narrative  . No narrative on file     Family History  Problem Relation Age of Onset  . Hypertension Mother   . CAD Father     a. father with MI at 62      Review of Systems: Review of Systems  Constitutional: Positive for diaphoresis and malaise/fatigue. Negative for chills, fever and weight loss.  HENT: Negative for congestion.   Eyes: Negative for discharge and redness.  Respiratory: Positive for shortness of breath. Negative for cough, hemoptysis, sputum production and wheezing.   Cardiovascular: Positive for chest pain. Negative for palpitations, orthopnea, claudication, leg swelling and PND.  Gastrointestinal: Positive for nausea. Negative for abdominal pain, blood in stool, heartburn, melena and vomiting.  Genitourinary: Negative for hematuria.  Musculoskeletal: Negative for falls and myalgias.  Skin: Negative for rash.  Neurological: Positive for weakness. Negative for dizziness, tingling, tremors, sensory change, speech change, focal weakness and loss of  consciousness.  Endo/Heme/Allergies: Does not bruise/bleed easily.  Psychiatric/Behavioral: Negative for substance abuse. The patient is not nervous/anxious.   All other systems reviewed and are negative.   Labs:  Recent Labs  05/03/16 1743 05/04/16 0502 05/04/16 0733  TROPONINI 0.04* 0.03* 0.03*   Lab Results  Component Value Date   WBC 4.6 05/04/2016   HGB 13.7 05/04/2016   HCT 38.2 (L) 05/04/2016   MCV 91.0 05/04/2016   PLT 171 05/04/2016     Recent Labs Lab 05/03/16 1743  NA 137  K 3.6  CL 103  CO2 28  BUN 14  CREATININE 1.33*  CALCIUM 9.1  PROT 6.7  BILITOT 0.7  ALKPHOS 68  ALT 17  AST 22  GLUCOSE 167*   Lab Results  Component Value Date   CHOL 158 05/04/2016   HDL 36 (L) 05/04/2016   LDLCALC 108 (H) 05/04/2016   TRIG 71 05/04/2016   No results found for: DDIMER  Radiology/Studies:  No results found.  EKG: Interpreted by me showed: Initial EKG NSR, 78  bpm, right axis deviation, baseline wandering V4, nonspecific inferolateral st/t changes. EKG this morning sinus bradycardia, 59 bpm, lateral TWI Telemetry: Interpreted by me showed: sinus bradycardia, upper 50s bpm  Weights: Filed Weights   05/03/16 1741 05/03/16 2012  Weight: 217 lb (98.4 kg) 214 lb 4.8 oz (97.2 kg)     Physical Exam: Blood pressure 137/67, pulse 60, temperature 97.5 F (36.4 C), temperature source Oral, resp. rate 16, height 5\' 10"  (1.778 m), weight 214 lb 4.8 oz (97.2 kg), SpO2 97 %. Body mass index is 30.75 kg/m. General: Well developed, well nourished, in no acute distress. Head: Normocephalic, atraumatic, sclera non-icteric, no xanthomas, nares are without discharge.  Neck: Negative for carotid bruits. JVD not elevated. Lungs: Clear bilaterally to auscultation without wheezes, rales, or rhonchi. Breathing is unlabored. Heart: RRR with S1 S2. No murmurs, rubs, or gallops appreciated. Abdomen: Soft, non-tender, non-distended with normoactive bowel sounds. No hepatomegaly.  No rebound/guarding. No obvious abdominal masses. Msk:  Strength and tone appear normal for age. Extremities: No clubbing or cyanosis. No edema. Distal pedal pulses are 2+ and equal bilaterally. Neuro: Alert and oriented X 3. No facial asymmetry. No focal deficit. Moves all extremities spontaneously. Psych:  Responds to questions appropriately with a normal affect.    Assessment and Plan:  Principal Problem:   Unstable angina (HCC) Active Problems:   Accelerated hypertension   CKD (chronic kidney disease), stage II   Hypokalemia   Hyperglycemia    1. Unstable angina: -Currently chest pain-free -Minimal troponin elevation with a peak of 0.04, currently down trending to 0.03 this morning -Patient's story is concerning for unstable angina given the stuttering nature as well as worsening chest pain with exertion -Uncertain if nuclear stress testing would answer the question being closed at this time -We'll schedule patient for left heart catheterization with Dr. end on 3/13 -Risks and benefits of cardiac catheterization have been discussed with the patient including risks of bleeding, bruising, infection, kidney damage, stroke, heart attack, and death. The patient understands these risks and is willing to proceed with the procedure. All questions have been answered and concerns listened to -Heparin drip per pharmacy -Continue ASA, Lopressor, Lipitor -Check echocardiogram -Cardiac rehabilitation recommended  2. Accelerated hypertension: -Currently improved to systolic blood pressure 1 33 mmHg -Continue Lopressor as above -Elevated blood pressure likely in the setting of severe substernal chest pain upon arrival  3. HLD: -Lipitor as above -Will need follow-up lipid liver and 8 weeks as an outpatient  4. CKD stage II: -At his approximate baseline -Avoid LV gram during cardiac cath -Will give 500 cc normal saline bolus this morning in preparation for cardiac catheterization -Will  need close monitoring of renal function status post cardiac cath  5. Hyperglycemia: -Check A1c  6. Hypokalemia: -Replete to goal 4.0 -Check magnesium   Signed, Christell Faith, PA-C [facility] Pager: [phone] 05/04/2016, 8:35 AM

## 2016-05-04 NOTE — Anesthesia Preprocedure Evaluation (Addendum)
Anesthesia Evaluation  Patient identified by MRN, date of birth, ID band Patient awake    Reviewed: Allergy & Precautions, H&P , NPO status , Patient's Chart, lab work & pertinent test results  Airway Mallampati: III  TM Distance: >3 FB Neck ROM: Full    Dental no notable dental hx. (+) Teeth Intact, Dental Advisory Given   Pulmonary asthma , former smoker,    Pulmonary exam normal breath sounds clear to auscultation       Cardiovascular Exercise Tolerance: Good hypertension, Pt. on medications and Pt. on home beta blockers + angina + CAD, + Past MI and + Peripheral Vascular Disease   Rhythm:Regular Rate:Normal     Neuro/Psych negative neurological ROS  negative psych ROS   GI/Hepatic Neg liver ROS, GERD  ,  Endo/Other  negative endocrine ROS  Renal/GU negative Renal ROS  negative genitourinary   Musculoskeletal   Abdominal   Peds  Hematology negative hematology ROS (+)   Anesthesia Other Findings   Reproductive/Obstetrics negative OB ROS                           Anesthesia Physical Anesthesia Plan  ASA: IV  Anesthesia Plan: General   Post-op Pain Management:    Induction: Intravenous  Airway Management Planned: Oral ETT and Video Laryngoscope Planned  Additional Equipment: Arterial line, CVP, PA Cath, TEE and Ultrasound Guidance Line Placement  Intra-op Plan:   Post-operative Plan: Post-operative intubation/ventilation  Informed Consent: I have reviewed the patients History and Physical, chart, labs and discussed the procedure including the risks, benefits and alternatives for the proposed anesthesia with the patient or authorized representative who has indicated his/her understanding and acceptance.   Dental advisory given  Plan Discussed with: CRNA  Anesthesia Plan Comments:        Anesthesia Quick Evaluation

## 2016-05-04 NOTE — Consult Note (Signed)
[address]       [place] [address]             [phone]      Cardiothoracic Surgery Consultation  Reason for Consult: High grade left main and multi-vessel coronary artery disease Referring Physician: Dr. Gerald Stabs End  JALON SQUIER is an 61 y.o. male.  HPI:   The patient is a 61 year old gentleman with a history of asthma, remote smoking and a family history of premature coronary artery disease ( father had MI age 56) who had an episode of substernal chest pain last Thursday 3/8 while ambulating. It resolved with rest. He had another episode on Friday that was similar [place]. He says he did not have pain again until he was driving home Sunday and stopped in a store. He had severe pain associated with nausea and diaphoresis and had to sit down for about 15 minutes before it resolved. He drove home and had another episode at home that was severe and associated with shortness of breath and diaphoresis. Yesterday he had some just walking across the room and went to [facility]. His initial ECG was felt to be suspicious for STEMI and he was seen by Dr. Fletcher Anon who ruled out STEMI and patient was admitted for unstable angina with troponin 0.04. He was heparinized and taken to cath lab this am which shows 95% distal LM stenosis with heavy calcification and diffusely calcified ostial and proximal LAD stenosis of 40, 50% mid LAD stenosis. The LCX is probably underfilled with three small caliber branches with 60% ostial and 70% mid LCX stenosis. The PDA has 70% stenosis. LVEF normal and LVEDP 11-12. He was transferred to [facility] for CABG. He ate lunch with no chest pain.  He lives in [place] with his wife. 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  Past Medical History:  Diagnosis Date  . Asthma   . CAD (coronary artery disease)    a. NSTEMI 05/04/2016; b. cardiac cath:  . Eczema   . Edema extremities   .  GERD (gastroesophageal reflux disease)   . Hemorrhoid   . Pneumonia Feb 2016    Past Surgical History:  Procedure Laterality Date  . COLONOSCOPY  2013  . disk in neck  2003  . [place]  . LAPAROSCOPY N/A 12/09/2014   Procedure: DIAGNOSTIC LAPAROSCOPY AND LAPAROTOMY;  Surgeon: Christene Lye, MD;  Location: [facility] ORS;  Service: General;  Laterality: N/A;  . LYMPH NODE BIOPSY  05-08-15   NON-NECROTIZING GRANULOMATOUS LYMPHADENITIS,   . TONSILLECTOMY  1965    Family History  Problem Relation Age of Onset  . Hypertension Mother   . CAD Father     a. father with MI at 52     Social History:  reports that he quit smoking about 11 years ago. He has never used smokeless tobacco. He reports that he drinks about 4.8 oz of alcohol per week . He reports that he does not use drugs.  Allergies:  Allergies  Allergen Reactions  . Contrast Media [Iodinated Diagnostic Agents] Other (See Comments)    Nausea and throat issues  . Adhesive [Tape] Other (See Comments)    Pulls skin off. Paper tape is OK to use as long as it isn't too tight.    Medications:  I have reviewed the patient's current medications. Prior to Admission:  Prescriptions Prior to Admission  Medication Sig Dispense Refill Last Dose  . atorvastatin (LIPITOR) 40 MG tablet Take 1 tablet (40 mg total) by mouth daily at 6 PM.   05/03/2016 at Unknown time  . clobetasol (TEMOVATE) 0.05 % external solution Apply 1 application topically as needed. Rash   05/03/2016 at Unknown time  . heparin 100-0.45 UNIT/ML-% infusion Inject 1,100 Units/hr into the vein continuous. 250 mL  05/04/2016 at Unknown time  . metoprolol tartrate (LOPRESSOR) 25 MG tablet Take 1 tablet (25 mg total) by mouth 2 (two) times daily.   05/04/2016 at 0800  . montelukast (SINGULAIR) 10 MG tablet Take 1 tablet by mouth daily. At bedtime.   05/03/2016 at Unknown time  . nitroGLYCERIN (NITROSTAT) 0.4 MG SL tablet Place 1 tablet (0.4 mg total) under  the tongue every 5 (five) minutes as needed for chest pain.  12 unknown at unknown  . VENTOLIN HFA 108 (90 BASE) MCG/ACT inhaler Inhale 2 puffs into the lungs every 4 (four) hours as needed.    Past Month at Unknown time   Scheduled: . [START ON 05/05/2016] pneumococcal 23 valent vaccine  0.5 mL Intramuscular Tomorrow-1000   Continuous:  PRN:  Results for orders placed or performed during the hospital encounter of 05/03/16 (from the past 48 hour(s))  Basic metabolic panel     Status: Abnormal   Collection Time: 05/03/16  5:43 PM  Result Value Ref Range   Sodium 137 135 - 145 mmol/L   Potassium 3.6 3.5 - 5.1 mmol/L   Chloride 103 101 - 111 mmol/L   CO2 28 22 - 32 mmol/L   Glucose, Bld 167 (H) 65 - 99 mg/dL   BUN 14 6 - 20 mg/dL   Creatinine, Ser 1.33 (H) 0.61 - 1.24 mg/dL   Calcium 9.1 8.9 - 10.3 mg/dL   GFR calc non Af Amer 56 (L) >60 mL/min   GFR calc Af Amer >60 >60 mL/min    Comment: (NOTE) The eGFR has been calculated using the CKD EPI equation. This calculation has not been validated in all clinical situations. eGFR's persistently <60 mL/min signify possible Chronic Kidney Disease.    Anion gap 6 5 - 15  CBC     Status: None   Collection Time: 05/03/16  5:43 PM  Result Value Ref Range   WBC 4.5 3.8 - 10.6 K/uL   RBC 4.48 4.40 - 5.90 MIL/uL   Hemoglobin 14.5 13.0 - 18.0 g/dL   HCT 41.1 40.0 - 52.0 %   MCV 91.8 80.0 - 100.0 fL   MCH 32.3 26.0 - 34.0 pg   MCHC 35.2 32.0 - 36.0 g/dL   RDW 14.1 11.5 - 14.5 %   Platelets 183 150 - 440 K/uL  Troponin I     Status: Abnormal   Collection Time: 05/03/16  5:43 PM  Result Value Ref Range   Troponin I 0.04 (HH) <0.03 ng/mL    Comment: CRITICAL RESULT CALLED TO, READ BACK BY AND VERIFIED WITH OLIVIA TAYLOR AT 1826 05/03/2016 BY TFK.   Protime-INR     Status: None   Collection Time: 05/03/16  5:43 PM  Result Value Ref Range   Prothrombin Time 13.0 11.4 - 15.2 seconds   INR 0.98   APTT     Status: None   Collection Time:  05/03/16  5:43 PM  Result Value Ref Range   aPTT 29 24 - 36 seconds  Differential     Status: None   Collection Time: 05/03/16  5:43 PM  Result Value Ref Range   Neutrophils Relative % 62 %   Neutro Abs 2.8 1.4 - 6.5 K/uL   Lymphocytes Relative 26 %   Lymphs Abs 1.2 1.0 - 3.6 K/uL   Monocytes Relative 7 %   Monocytes Absolute 0.3 0.2 - 1.0 K/uL   Eosinophils Relative 4 %   Eosinophils Absolute 0.2 0 - 0.7 K/uL   Basophils Relative 1 %   Basophils Absolute 0.0 0 - 0.1 K/uL  Hepatic function panel     Status: None   Collection Time: 05/03/16  5:43 PM  Result Value Ref Range   Total Protein 6.7 6.5 - 8.1 g/dL   Albumin 4.0 3.5 - 5.0 g/dL   AST 22 15 - 41 U/L   ALT 17 17 - 63 U/L   Alkaline Phosphatase 68 38 - 126 U/L   Total Bilirubin 0.7 0.3 - 1.2 mg/dL   Bilirubin, Direct 0.2 0.1 - 0.5 mg/dL   Indirect Bilirubin 0.5 0.3 - 0.9 mg/dL  Lipid panel     Status: Abnormal   Collection Time: 05/03/16  5:43 PM  Result Value Ref Range   Cholesterol 169 0 - 200 mg/dL   Triglycerides 231 (H) <150 mg/dL   HDL 38 (L) >40 mg/dL   Total CHOL/HDL Ratio 4.4 RATIO   VLDL 46 (H) 0 - 40 mg/dL   LDL Cholesterol 85 0 - 99 mg/dL    Comment:        Total Cholesterol/HDL:CHD Risk Coronary Heart Disease Risk Table                     Men   Women  1/2 Average Risk   3.4   3.3  Average Risk       5.0   4.4  2 X Average Risk   9.6   7.1  3 X Average Risk  23.4   11.0        Use the calculated Patient Ratio above and the CHD Risk Table to determine the patient's CHD Risk.        ATP III CLASSIFICATION (LDL):  <100     mg/dL   Optimal  100-129  mg/dL   Near or Above                    Optimal  130-159  mg/dL   Borderline  160-189  mg/dL   High  >190     mg/dL   Very High   Heparin level (unfractionated)     Status: None   Collection Time: 05/04/16 12:23 AM  Result Value Ref Range   Heparin Unfractionated 0.43 0.30 - 0.70 IU/mL    Comment:        IF HEPARIN RESULTS ARE BELOW EXPECTED  VALUES, AND PATIENT DOSAGE HAS BEEN CONFIRMED, SUGGEST FOLLOW UP TESTING OF ANTITHROMBIN III LEVELS.   Lipid panel     Status: Abnormal   Collection Time: 05/04/16 12:23 AM  Result Value Ref Range   Cholesterol 158 0 - 200 mg/dL   Triglycerides 71 <150 mg/dL   HDL 36 (L) >40 mg/dL   Total CHOL/HDL Ratio 4.4 RATIO   VLDL 14 0 - 40 mg/dL   LDL Cholesterol 108 (H) 0 - 99 mg/dL    Comment:        Total Cholesterol/HDL:CHD Risk Coronary Heart Disease Risk Table  Men   Women  1/2 Average Risk   3.4   3.3  Average Risk       5.0   4.4  2 X Average Risk   9.6   7.1  3 X Average Risk  23.4   11.0        Use the calculated Patient Ratio above and the CHD Risk Table to determine the patient's CHD Risk.        ATP III CLASSIFICATION (LDL):  <100     mg/dL   Optimal  100-129  mg/dL   Near or Above                    Optimal  130-159  mg/dL   Borderline  160-189  mg/dL   High  >190     mg/dL   Very High   CBC     Status: Abnormal   Collection Time: 05/04/16  5:02 AM  Result Value Ref Range   WBC 4.6 3.8 - 10.6 K/uL   RBC 4.19 (L) 4.40 - 5.90 MIL/uL   Hemoglobin 13.7 13.0 - 18.0 g/dL   HCT 38.2 (L) 40.0 - 52.0 %   MCV 91.0 80.0 - 100.0 fL   MCH 32.7 26.0 - 34.0 pg   MCHC 35.9 32.0 - 36.0 g/dL   RDW 14.1 11.5 - 14.5 %   Platelets 171 150 - 440 K/uL  Troponin I     Status: Abnormal   Collection Time: 05/04/16  5:02 AM  Result Value Ref Range   Troponin I 0.03 (HH) <0.03 ng/mL    Comment: CRITICAL VALUE NOTED. VALUE IS CONSISTENT WITH PREVIOUSLY REPORTED/CALLED VALUE BY CAF   Heparin level (unfractionated)     Status: None   Collection Time: 05/04/16  7:33 AM  Result Value Ref Range   Heparin Unfractionated 0.43 0.30 - 0.70 IU/mL    Comment:        IF HEPARIN RESULTS ARE BELOW EXPECTED VALUES, AND PATIENT DOSAGE HAS BEEN CONFIRMED, SUGGEST FOLLOW UP TESTING OF ANTITHROMBIN III LEVELS.   Troponin I     Status: Abnormal   Collection Time: 05/04/16   7:33 AM  Result Value Ref Range   Troponin I 0.03 (HH) <0.03 ng/mL    Comment: CRITICAL VALUE NOTED. VALUE IS CONSISTENT WITH PREVIOUSLY REPORTED/CALLED VALUE...[facility]    Dg Chest 1 View  Result Date: 05/04/2016 CLINICAL DATA:  Chest pain. EXAM: CHEST 1 VIEW COMPARISON:  None. FINDINGS: The heart size is normal. The lungs are clear. There is no edema or effusion. The visualized soft tissues and bony thorax are unremarkable. IMPRESSION: Negative one-view chest x-ray Electronically Signed   By: San Morelle M.D.   On: 05/04/2016 09:12    Review of Systems  Constitutional: Positive for diaphoresis and malaise/fatigue. Negative for chills, fever and weight loss.  HENT: Negative.   Eyes: Negative.   Respiratory: Positive for shortness of breath. Negative for cough.   Cardiovascular: Positive for chest pain and leg swelling. Negative for orthopnea and PND.  Gastrointestinal: Positive for heartburn and nausea.  Genitourinary: Negative.   Musculoskeletal: Negative.   Skin: Negative.   Neurological: Negative.   Endo/Heme/Allergies: Negative.   Psychiatric/Behavioral: Negative.    Blood pressure 124/76, pulse 64. Physical Exam  Constitutional: He is oriented to person, place, and time.  Obese gentleman in no distress  HENT:  Head: Normocephalic and atraumatic.  Mouth/Throat: Oropharynx is clear and moist.  Eyes: EOM are normal. Pupils are equal, round, and  reactive to light.  Neck: Normal range of motion. Neck supple. No JVD present. No thyromegaly present.  Cardiovascular: Normal rate, regular rhythm, normal heart sounds and intact distal pulses.   No murmur heard. Respiratory: Breath sounds normal. No respiratory distress.  GI: Soft. Bowel sounds are normal. He exhibits no distension. There is no tenderness.  Musculoskeletal: Normal range of motion. He exhibits no edema.  Lymphadenopathy:    He has no cervical adenopathy.  Neurological: He is alert and oriented to person, place,  and time. He has normal strength. No cranial nerve deficit or sensory deficit.  Skin: Skin is warm and dry.  Psychiatric: He has a normal mood and affect.   CHACE KLIPPEL  Cardiac catheterization  Order# [id number]  Reading physician: Nelva Bush, MD Ordering physician: Rise Mu, PA-C Study date: 05/04/16  Physicians   Panel Physicians Referring Physician Case Authorizing Physician  Nelva Bush, MD (Primary)  Rise Mu, PA-C  Procedures   Left Heart Cath and Coronary Angiography  Conclusion   Conclusions: 1. Significant multivessel coronary artery disease, including 90% distal LMCA stenosis with heavy calcification, diffusely calcified ostial/proximal LAD disease of up to 40%, 50% mid LAD stenosis, sequential 60% and 70% ostial and mid LCx lesions, 30-40% proximal and mid RCA disease, and 70% stenosis in midportion of small rPDA. 2. Normal left ventricular contraction. 3. Normal left ventricular filling pressure.  Recommendations: 1. Transfer to [facility] for surgical evaluation for CABG, given severe LMCA disease. 2. Restart heparin infusion 2 hours after TR band has been removed. 3. Aggressive secondary prevention, including ASA, high-intensity statin therapy, and beta-blockade.  Nelva Bush, MD [facility] Pager: [phone]   Indications   Unstable angina ([place]) [I20.0 (ICD-10-CM)]  Procedural Details/Technique   Technical Details Indication: 61 y.o. year-old man with history of mesenteric mass of uncertain etiology, admitted with worsening exertional chest pain over the last 5 days. Troponin was borderline elevated, consistent with unstable angina.  GFR: 56 ml/min  Procedure: The risks, benefits, complications, treatment options, and expected outcomes were discussed with the patient. The patient and/or family concurred with the proposed plan, giving informed consent. The patient was brought to the cath lab after IV hydration was begun and oral  premedication was given. The patient was further sedated with Versed and Fentanyl. The right wrist was assessed with a modified Allens test which was normal. The right wrist was prepped and draped in a sterile fashion. 1% lidocaine was used for local anesthesia. Using the modified Seldinger access technique, a 6 French Terumo slender Glidesheath was placed in the right radial artery. 3 mg Verapamil was given through the sheath. Heparin 5,000 units were administered.  Selective coronary angiography was performed using 74F TIG catheter to engage the left and right coronary arteries. Left heart catheterization was performed using a 74F pigtail catheter. Left ventriculogram was performed with a power injection of contrast.  At the end of the procedure, the radial artery sheath was removed and a TR band applied to achieve patent hemostasis. There were no immediate complications. The patient was taken to the recovery area in stable condition.  Contrast used: 65 mL Isovue Fluoroscopy time: 4.3 min Radiation dose: 991 mGy   Estimated blood loss <50 mL.  During this procedure the patient was administered the following to achieve and maintain moderate conscious sedation: Versed 1 mg, Fentanyl 50 mcg, while the patient's heart rate, blood pressure, and oxygen saturation were continuously monitored. The period of conscious sedation was 29  minutes, of which I was present face-to-face 100% of this time.    Complications   Complications documented before study signed (05/04/2016 12:13 PM EDT)    No complications were associated with this study.  Documented by Nelva Bush, MD - 05/04/2016 12:09 PM EDT    Coronary Findings   Dominance: Right  Left Main  Vessel is large.  LM-1 lesion, 20% stenosed.  LM-2 lesion, 90% stenosed. The lesion is eccentric. The lesion is severely calcified.  Left Anterior Descending  Vessel is large.  Ost LAD to Prox LAD lesion, 40% stenosed. The lesion is eccentric. The  lesion is moderately calcified.  Mid LAD lesion, 50% stenosed. The lesion is eccentric.  First Diagonal Branch  Vessel is moderate in size.  Ramus Intermedius  Vessel is moderate in size.  Left Circumflex  Vessel is moderate in size.  Ost Cx lesion, 60% stenosed. The lesion is eccentric.  Mid Cx lesion, 70% stenosed.  First Obtuse Marginal Branch  Vessel is moderate in size.  Third Obtuse Marginal Branch  Vessel is moderate in size.  Right Coronary Artery  Vessel is moderate in size.  Prox RCA lesion, 40% stenosed.  Mid RCA lesion, 30% stenosed.  Right Posterior Descending Artery  Vessel is small in size.  RPDA lesion, 70% stenosed.  Right Posterior Atrioventricular Branch  Vessel is moderate in size.  Wall Motion              Left Heart   Left Ventricle The left ventricular size is normal. The left ventricular systolic function is normal. LV end diastolic pressure is normal. The left ventricular ejection fraction is 55-65% by visual estimate. No regional wall motion abnormalities. There is no evidence of mitral regurgitation.    Aortic Valve There is no aortic valve stenosis.    Coronary Diagrams   Diagnostic Diagram     Implants     No implant documentation for this case.  PACS Images   Show images for Cardiac catheterization   Link to Procedure Log   Procedure Log    Hemo Data   AO Systolic Cath Pressure AO Diastolic Cath Pressure AO Mean Cath Pressure LV Systolic Cath Pressure LV End Diastolic  092 66 mmHg 90 mmHg -- --  130 61 mmHg 86 mmHg -- --  -- -- -- 127 mmHg 12 mmHg  -- -- -- 115 mmHg 12 mmHg  -- -- -- 120 mmHg 11 mmHg  133 63 mmHg 90 mmHg -- --    Assessment/Plan:  This gentleman has a high grade distal LM stenosis and severe 3-vessel coronary disease presenting with unstable angina. His LVEF is normal. I agree that CABG is the best treatment for this patient. I discussed the operative procedure with the patient  including alternatives,  benefits and risks; including but not limited to bleeding, blood transfusion, infection, stroke, myocardial infarction, graft failure, heart block requiring a permanent pacemaker, organ dysfunction, and death.  [place]aland Lao understands and agrees to proceed.  We will schedule surgery for tomorrow morning. He should be started back on heparin post-cath.   I spent 60 minutes performing this consultation and > 50% of this time was spent face to face counseling and coordinating the care of this patient's severe left main and multi-vessel coronary artery disease.  Gaye Pollack 05/04/2016, 4:13 PM

## 2016-05-04 NOTE — Progress Notes (Signed)
ANTICOAGULATION CONSULT NOTE - Initial Consult  Pharmacy Consult for Heparin Drip dosing and monitoring Indication: chest pain/ACS  Allergies  Allergen Reactions  . Adhesive [Tape] Other (See Comments)    Pulls skin off. Paper tape is OK to use as long as it isn't too tight.    Patient Measurements: Height: 5\' 10"  (177.8 cm) Weight: 214 lb 4.8 oz (97.2 kg) IBW/kg (Calculated) : 73  Vital Signs: Temp: 97.6 F (36.4 C) (03/12 2012) Temp Source: Oral (03/12 2012) BP: 175/78 (03/12 2012) Pulse Rate: 65 (03/12 2012)  Labs:  Recent Labs  05/03/16 1743 05/04/16 0023  HGB 14.5  --   HCT 41.1  --   PLT 183  --   APTT 29  --   LABPROT 13.0  --   INR 0.98  --   HEPARINUNFRC  --  0.43  CREATININE 1.33*  --   TROPONINI 0.04*  --     Estimated Creatinine Clearance: 68.2 mL/min (by C-G formula based on SCr of 1.33 mg/dL (H)).   Medical History: Past Medical History:  Diagnosis Date  . Asthma   . Eczema   . Hemorrhoid   . Pneumonia Feb 2016    Assessment: 61 yo male with chest pain for 4-5 days. Pharmacy consulted for heparin dosing and monitoring.   Goal of Therapy:  Heparin level 0.3-0.7 units/ml Monitor platelets by anticoagulation protocol: Yes   Plan:  Continue heparin drip at 1100 units/hr and recheck in 6 hours.   Napoleon Form, PharmD, BCPS Clinical Pharmacist 05/04/2016 3:05 AM

## 2016-05-04 NOTE — H&P (Signed)
History and Physical  Patient ID: Alan Fernandez MRN: [id number], DOB: 1955-09-21 Admit Date: (Not on file) Date of Encounter: 05/04/2016, 12:10 PM Primary Physician: Albina Billet, MD Primary Cardiologist: Dr. Saunders Revel, MD  Chief Complaint: Chest pain Reason for Admission: NSTEMI with cardiac cath showing severe multi-vessel CAD  HPI: 61 y.o. 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  Patient has no previously known cardiac history prior to this admission. Patient was in his usual state of health up until Thursday, 3/8 when he was ambulating into a store from a parking lot and developed severe substernal chest pain without radiation with associated shortness of breath, diaphoresis, and nausea. Upon getting into the store patient sat down in a chair for approximately 15 minutes with resolution of symptoms. While in the store he had no further symptoms. Patient again experienced exertional chest pain on Friday, 3/9 that improved with rest. On 3/11 patient noted worsening substernal chest pain with exertion that again improved with rest for approximately 15 minutes. This too was associated with shortness of breath, nausea, and diaphoresis. He initially thought all of these episodes were related to either reflux or his asthma. On 3/12 patient was ambulating in his living room and developed substernal chest pain that was characterized as a deep pressure and worse than any prior episode. This pain again was associated with shortness of breath,  nausea, diaphoresis. No associated vomiting, palpitations, dizziness, presyncope, or syncope. Patient does report having a similar episode of chest pain just prior to Christmas while ambulating up a driveway at an incline though he thought this result was related to his asthma. Chest pain lasted for approximately 2.5 hours on 3/2 before self resolution.  Upon the patient's arrival to [facility] they were found to have systolic blood pressure 606 mmHg, heart rate 77 bpm, afebrile, oxygen saturation 97% on room air. Troponin trend has been mildly elevated and flat, at 0.03 with a peak of 0.04 currently down trending to 0.03 this morning. He is currently chest pain-free. He was started on heparin drip by the admitting team. White blood cell count 4.6, hemoglobin 13.7, platelet count 171, LDL 108, normal LFT, serum creatinine 1.3 with a baseline of approximately 1.1-1.3, potassium 3.6. ECG as below, CXR negative. At time of cardiology consultation patient was chest pain-free, on heparin drip, with systolic blood pressure 301 mmHg. Given patient's presenting story he underwent left heart catheterization Dr. Saunders Revel on 3/13 that showed significant multivessel coronary artery disease, including 90% distal left main coronary artery stenosis with heavy calcification, diffusely calcified ostial/proximal LAD disease of up to 40%, 50% mid LAD stenosis, sequential 60% and 70% ostial and mid LCx lesions, 30-40% proximal and mid RCA stenosis, and 70% stenosis in the midportion of a small RPDA. It was recommended he be transferred [facility] for evaluation of possible CABG. Currently, chest pain free. Echocardiogram is pending at this time.  Past Medical History:  Diagnosis Date  . Asthma   . CAD (coronary artery disease)    a. NSTEMI 05/04/2016; b. cardiac cath:  . Eczema   .  Hemorrhoid   . Pneumonia Feb 2016     Most Recent Cardiac Studies: LHC 05/04/2016: Coronary Findings   Dominance: Right  Left Main    Vessel is large.  LM-1 lesion, 20% stenosed.  LM-2 lesion, 90% stenosed. The lesion is eccentric. The lesion is severely calcified.  Left Anterior Descending  Vessel is large.  Ost LAD to Prox LAD lesion, 40% stenosed. The lesion is eccentric. The lesion is moderately calcified.  Mid LAD lesion, 50% stenosed. The lesion is eccentric.  First Diagonal Branch  Vessel is moderate in size.  Ramus Intermedius  Vessel is moderate in size.  Left Circumflex  Vessel is moderate in size.  Ost Cx lesion, 60% stenosed. The lesion is eccentric.  Mid Cx lesion, 70% stenosed.  First Obtuse Marginal Branch  Vessel is moderate in size.  Third Obtuse Marginal Branch  Vessel is moderate in size.  Right Coronary Artery  Vessel is moderate in size.  Prox RCA lesion, 40% stenosed.  Mid RCA lesion, 30% stenosed.  Right Posterior Descending Artery  Vessel is small in size.  RPDA lesion, 70% stenosed.  Right Posterior Atrioventricular Branch  Vessel is moderate in size.  Wall Motion              Left Heart   Left Ventricle The left ventricular size is normal. The left ventricular systolic function is normal. LV end diastolic pressure is normal. The left ventricular ejection fraction is 55-65% by visual estimate. No regional wall motion abnormalities. There is no evidence of mitral regurgitation.    Aortic Valve There is no aortic valve stenosis.    Coronary Diagrams   Diagnostic Diagram       Conclusions: 1. Significant multivessel coronary artery disease, including 90% distal LMCA stenosis with heavy calcification, diffusely calcified ostial/proximal LAD disease of up to 40%, 50% mid LAD stenosis, sequential 60% and 70% ostial and mid LCx lesions, 30-40% proximal and mid RCA disease, and 70% stenosis in midportion of small rPDA. 2. Normal left ventricular contraction. 3. Normal left ventricular filling pressure.  Recommendations: 1. Transfer to [facility] for surgical evaluation for  CABG, given severe LMCA disease. 2. Restart heparin infusion 2 hours after TR band has been removed. 3. Aggressive secondary prevention, including ASA, high-intensity statin therapy, and beta-blockade.      Surgical History:  Past Surgical History:  Procedure Laterality Date  . COLONOSCOPY  2013  . disk in neck  2003  . [place]  . LAPAROSCOPY N/A 12/09/2014   Procedure: DIAGNOSTIC LAPAROSCOPY AND LAPAROTOMY;  Surgeon: Christene Lye, MD;  Location: [facility] ORS;  Service: General;  Laterality: N/A;  . LYMPH NODE BIOPSY  05-08-15   NON-NECROTIZING GRANULOMATOUS LYMPHADENITIS,   . TONSILLECTOMY  1965     Home Meds: Prior to Admission medications   Medication Sig Start Date End Date Taking? Authorizing Provider  clobetasol (TEMOVATE) 0.05 % external solution Apply 1 application topically as needed.  12/22/12   Historical Provider, MD  montelukast (SINGULAIR) 10 MG tablet Take 1 tablet by mouth daily. At bedtime. 10/23/12   Historical Provider, MD  VENTOLIN HFA 108 (90 BASE) MCG/ACT inhaler Inhale 2 puffs into the lungs every 4 (four) hours as needed.  01/28/13   Historical Provider, MD    Allergies:  Allergies  Allergen Reactions  . Contrast Media [Iodinated Diagnostic Agents] Other (See Comments)    Nausea and throat issues  . Adhesive [Tape] Other (See Comments)    Pulls skin off. Paper  tape is OK to use as long as it isn't too tight.    Social History   Social History  . Marital status: Married    Spouse name: N/A  . Number of children: N/A  . Years of education: N/A   Occupational History  . Not on file.   Social History Main Topics  . Smoking status: Former Smoker    Quit date: 12/05/2004  . Smokeless tobacco: Never Used  . Alcohol use 4.8 oz/week    8 Cans of beer per week     Comment: 4-5 beers per week  . Drug use: No  . Sexual activity: Not on file   Other Topics Concern  . Not on file   Social History Narrative  . No narrative on  file     Family History  Problem Relation Age of Onset  . Hypertension Mother   . CAD Father     a. father with MI at 38     Review of Systems: Review of Systems  Constitutional: Positive for diaphoresis and malaise/fatigue. Negative for chills, fever and weight loss.  HENT: Negative for congestion.   Eyes: Negative for discharge and redness.  Respiratory: Positive for shortness of breath. Negative for cough, hemoptysis, sputum production and wheezing.   Cardiovascular: Positive for chest pain. Negative for palpitations, orthopnea, claudication, leg swelling and PND.  Gastrointestinal: Positive for nausea. Negative for abdominal pain, blood in stool, constipation, diarrhea, heartburn, melena and vomiting.  Genitourinary: Negative for hematuria.  Musculoskeletal: Negative for falls and myalgias.  Skin: Negative for rash.  Neurological: Positive for weakness. Negative for dizziness, tingling, tremors, sensory change, speech change, focal weakness and loss of consciousness.  Endo/Heme/Allergies: Does not bruise/bleed easily.  Psychiatric/Behavioral: Negative for substance abuse. The patient is not nervous/anxious.   All other systems reviewed and are negative.   Labs:   Lab Results  Component Value Date   WBC 4.6 05/04/2016   HGB 13.7 05/04/2016   HCT 38.2 (L) 05/04/2016   MCV 91.0 05/04/2016   PLT 171 05/04/2016     Recent Labs Lab 05/03/16 1743  NA 137  K 3.6  CL 103  CO2 28  BUN 14  CREATININE 1.33*  CALCIUM 9.1  PROT 6.7  BILITOT 0.7  ALKPHOS 68  ALT 17  AST 22  GLUCOSE 167*    Recent Labs  05/03/16 1743 05/04/16 0502 05/04/16 0733  TROPONINI 0.04* 0.03* 0.03*   Lab Results  Component Value Date   CHOL 158 05/04/2016   HDL 36 (L) 05/04/2016   LDLCALC 108 (H) 05/04/2016   TRIG 71 05/04/2016   No results found for: DDIMER  Radiology/Studies:  Dg Chest 1 View  Result Date: 05/04/2016 CLINICAL DATA:  Chest pain. EXAM: CHEST 1 VIEW COMPARISON:   None. FINDINGS: The heart size is normal. The lungs are clear. There is no edema or effusion. The visualized soft tissues and bony thorax are unremarkable. IMPRESSION: Negative one-view chest x-ray Electronically Signed   By: Alan Morelle M.D.   On: 05/04/2016 09:12     EKG: Interpreted by me showed:  Initial EKG NSR, 78 bpm, right axis deviation, baseline wandering V4, nonspecific inferolateral st/t changes. EKG this morning sinus bradycardia, 59 bpm, lateral TWI Telemetry: Interpreted by me showed: sinus bradycardia, upper 50's bpm  Weights: Filed Weights   05/03/16 1741 05/03/16 2012  Weight: 217 lb (98.4 kg) 214 lb 4.8 oz (97.2 kg)     Physical Exam: Blood pressure 137/67, pulse 60, temperature  97.5 F (36.4 C), temperature source Oral, resp. rate 16, height 5\' 10"  (1.778 m), weight 214 lb 4.8 oz (97.2 kg), SpO2 97 %. Body mass index is 30.75 kg/m. General: Well developed, well nourished, in no acute distress. Head: Normocephalic, atraumatic, sclera non-icteric, no xanthomas, nares are without discharge.  Neck: Negative for carotid bruits. JVD not elevated. Lungs: Clear bilaterally to auscultation without wheezes, rales, or rhonchi. Breathing is unlabored. Heart: RRR with S1 S2. No murmurs, rubs, or gallops appreciated. Abdomen: Soft, non-tender, non-distended with normoactive bowel sounds. No hepatomegaly. No rebound/guarding. No obvious abdominal masses. Msk:  Strength and tone appear normal for age. Extremities: No clubbing or cyanosis. No edema. Distal pedal pulses are 2+ and equal bilaterally. Neuro: Alert and oriented X 3. No focal deficit. No facial asymmetry. Moves all extremities spontaneously. Psych:  Responds to questions appropriately with a normal affect.    ASSESSMENT AND PLAN:  Principal Problem:   Non-ST elevation (NSTEMI) myocardial infarction [facility]) Active Problems:   Accelerated hypertension   CAD in native artery   CKD (chronic kidney disease), stage  II   Hypokalemia   Hyperglycemia   Venous insufficiency   1. NSTEMI/multi--vessel CAD: -Currently chest pain-free -Troponin peaked at 0.04 -Cardiac catheterization as above showing severe three-vessel CAD with recommendation of transfer to [facility] for evaluation of possible CABG -Restart heparin drip 2 hours after TR band removal -Check echocardiogram -ASA, Lopressor, Lipitor -Cardiac rehabilitation  2. Accelerated hypertension: -Likely in the setting of his severe substernal chest pain upon arrival to the ED -Much improved -Continue Lopressor as above  3. CKD stage II: -Stable at current time -Status post 500 cc normal saline bolus prior to catheter -Monitor renal function status post catheter  4. Hypokalemia: -Status post repletion to goal of 4.0 -Check magnesium  5. Hyperglycemia: -Check hemoglobin A1c  6. Venous insufficiency: -Compression stockings -Check echo as above   Signed, Christell Faith, PA-C [place] Pager: [phone] 05/04/2016, 12:10 PM

## 2016-05-04 NOTE — Discharge Summary (Signed)
[place] [facility] [place] NAME: [facility]    MR#:  [id number]  DATE OF BIRTH:  [birth year]  DATE OF ADMISSION:  05/03/2016   ADMITTING PHYSICIAN: Nicholes Mango, MD  DATE OF DISCHARGE: No discharge date for patient encounter.  PRIMARY CARE PHYSICIAN: TATE,DENNY C, MD   ADMISSION DIAGNOSIS:  sent by dr for chest pain NSTEMI DISCHARGE DIAGNOSIS:  Principal Problem:   Unstable angina ([place]) Active Problems:   Accelerated hypertension   CKD (chronic kidney disease), stage II   Hypokalemia   Hyperglycemia   NSTEMI (non-ST elevated myocardial infarction) ([place]) NSTEMI/multi--vessel CAD SECONDARY DIAGNOSIS:   Past Medical History:  Diagnosis Date  . Asthma   . CAD (coronary artery disease)    a. NSTEMI 05/04/2016; b. cardiac cath:  . Eczema   . Hemorrhoid   . Pneumonia Feb 2016   HOSPITAL COURSE:   1. NSTEMI/multi--vessel CAD: -Currently chest pain-free -Troponin peaked at 0.04 -Cardiac catheterization as above showing severe three-vessel CAD with recommendation of transfer to [facility] for evaluation of possible CABG -Restart heparin drip 2 hours after TR band removal pending echocardiogram -ASA, Lopressor, Lipitor -Cardiac rehabilitation  2. Accelerated hypertension: BP controlled. -Likely in the setting of his severe substernal chest pain upon arrival to the ED -Much improved -Continue Lopressor as above  3. CKD stage II: -Stable at current time -Status post 500 cc normal saline bolus prior to catheter -Monitor renal function status post catheter  4. Hypokalemia: -Status post repletion to goal of 4.0 -Check magnesium  5. Hyperglycemia:statin. LDL 108. DISCHARGE CONDITIONS:  Guarded, transfer to [facility] hospital today. CONSULTS OBTAINED:  Treatment Team:  Wellington Hampshire, MD Gaye Pollack, MD DRUG ALLERGIES:   Allergies  Allergen Reactions  . Contrast Media [Iodinated Diagnostic Agents] Other  (See Comments)    Nausea and throat issues  . Adhesive [Tape] Other (See Comments)    Pulls skin off. Paper tape is OK to use as long as it isn't too tight.   DISCHARGE MEDICATIONS:   Allergies as of 05/04/2016      Reactions   Contrast Media [iodinated Diagnostic Agents] Other (See Comments)   Nausea and throat issues   Adhesive [tape] Other (See Comments)   Pulls skin off. Paper tape is OK to use as long as it isn't too tight.      Medication List    TAKE these medications   atorvastatin 40 MG tablet Commonly known as:  LIPITOR Take 1 tablet (40 mg total) by mouth daily at 6 PM.   clobetasol 0.05 % external solution Commonly known as:  TEMOVATE Apply 1 application topically as needed.   heparin 100-0.45 UNIT/ML-% infusion Inject 1,100 Units/hr into the vein continuous.   metoprolol tartrate 25 MG tablet Commonly known as:  LOPRESSOR Take 1 tablet (25 mg total) by mouth 2 (two) times daily.   montelukast 10 MG tablet Commonly known as:  SINGULAIR Take 1 tablet by mouth daily. At bedtime.   nitroGLYCERIN 0.4 MG SL tablet Commonly known as:  NITROSTAT Place 1 tablet (0.4 mg total) under the tongue every 5 (five) minutes as needed for chest pain.   VENTOLIN HFA 108 (90 Base) MCG/ACT inhaler Generic drug:  albuterol Inhale 2 puffs into the lungs every 4 (four) hours as needed.        DISCHARGE INSTRUCTIONS:  See AVS. If you experience worsening of your admission symptoms, develop shortness of breath, life threatening emergency, suicidal or  homicidal thoughts you must seek medical attention immediately by calling 911 or calling your MD immediately  if symptoms less severe.  You Must read complete instructions/literature along with all the possible adverse reactions/side effects for all the Medicines you take and that have been prescribed to you. Take any new Medicines after you have completely understood and accpet all the possible adverse reactions/side effects.    Please note  You were cared for by a hospitalist during your hospital stay. If you have any questions about your discharge medications or the care you received while you were in the hospital after you are discharged, you can call the unit and asked to speak with the hospitalist on call if the hospitalist that took care of you is not available. Once you are discharged, your primary care physician will handle any further medical issues. Please note that NO REFILLS for any discharge medications will be authorized once you are discharged, as it is imperative that you return to your primary care physician (or establish a relationship with a primary care physician if you do not have one) for your aftercare needs so that they can reassess your need for medications and monitor your lab values.    On the day of Discharge:  VITAL SIGNS:  Blood pressure (!) 142/82, pulse 68, temperature 97.6 F (36.4 C), temperature source Oral, resp. rate 19, height 5\' 10"  (1.778 m), weight 214 lb (97.1 kg), SpO2 95 %. PHYSICAL EXAMINATION:  GENERAL:  61 y.o.-year-old patient lying in the bed with no acute distress.  EYES: Pupils equal, round, reactive to light and accommodation. No scleral icterus. Extraocular muscles intact.  HEENT: Head atraumatic, normocephalic. Oropharynx and nasopharynx clear.  NECK:  Supple, no jugular venous distention. No thyroid enlargement, no tenderness.  LUNGS: Normal breath sounds bilaterally, no wheezing, rales,rhonchi or crepitation. No use of accessory muscles of respiration.  CARDIOVASCULAR: S1, S2 normal. No murmurs, rubs, or gallops.  ABDOMEN: Soft, non-tender, non-distended. Bowel sounds present. No organomegaly or mass.  EXTREMITIES: No pedal edema, cyanosis, or clubbing.  NEUROLOGIC: Cranial nerves II through XII are intact. Muscle strength 5/5 in all extremities. Sensation intact. Gait not checked.  PSYCHIATRIC: The patient is alert and oriented x 3.  SKIN: No obvious rash,  lesion, or ulcer.  DATA REVIEW:   CBC  Recent Labs Lab 05/04/16 0502  WBC 4.6  HGB 13.7  HCT 38.2*  PLT 171    Chemistries   Recent Labs Lab 05/03/16 1743  NA 137  K 3.6  CL 103  CO2 28  GLUCOSE 167*  BUN 14  CREATININE 1.33*  CALCIUM 9.1  AST 22  ALT 17  ALKPHOS 68  BILITOT 0.7     Microbiology Results  No results found for this or any previous visit.  RADIOLOGY:  Dg Chest 1 View  Result Date: 05/04/2016 CLINICAL DATA:  Chest pain. EXAM: CHEST 1 VIEW COMPARISON:  None. FINDINGS: The heart size is normal. The lungs are clear. There is no edema or effusion. The visualized soft tissues and bony thorax are unremarkable. IMPRESSION: Negative one-view chest x-ray Electronically Signed   By: San Morelle M.D.   On: 05/04/2016 09:12     Management plans discussed with the patient, his wife and they are in agreement.  CODE STATUS: Full Code   TOTAL TIME TAKING CARE OF THIS PATIENT: 35 minutes.    Demetrios Loll M.D on 05/04/2016 at 1:45 PM  Between 7am to 6pm - Pager - [phone]  After 6pm go  to [email or web address] - Proofreader  [facility]  Office  [phone]  CC: Primary care physician; Albina Billet, MD   Note: This dictation was prepared with Dragon dictation along with smaller phrase technology. Any transcriptional errors that result from this process are unintentional.

## 2016-05-04 NOTE — Discharge Instructions (Signed)
Recommendations: 1. Transfer to [facility] for surgical evaluation for CABG, given severe LMCA disease. 2. Restart heparin infusion 2 hours after TR band has been removed. 3. Aggressive secondary prevention, including ASA, high-intensity statin therapy, and beta-blockade.

## 2016-05-04 NOTE — Interval H&P Note (Signed)
History and Physical Interval Note:  05/04/2016 11:10 AM  Alan Fernandez  has presented today for cardiac catheterization, with the diagnosis of unstable angina. The various methods of treatment have been discussed with the patient and family. After consideration of risks, benefits and other options for treatment, the patient has consented to  Procedure(s): Left Heart Cath and Coronary Angiography (N/A) as a surgical intervention .  The patient's history has been reviewed, patient examined, no change in status, stable for surgery.  I have reviewed the patient's chart and labs.  Questions were answered to the patient's satisfaction.    Cath Lab Visit (complete for each Cath Lab visit)  Clinical Evaluation Leading to the Procedure:   ACS: Yes.   (unstable angina)  Non-ACS: N/A  [name]

## 2016-05-04 NOTE — Progress Notes (Signed)
[facility] here to transport patient to [facility] 

## 2016-05-04 NOTE — Progress Notes (Signed)
Patient to transfer to [facility] for CABG. Per Dr. Saunders Revel, patient may eat lunch. [facility] here for cardiopulmonary to do ECHO.

## 2016-05-04 NOTE — Progress Notes (Signed)
Notified by the cath lab that EMS will be retrieving patient from specials to transfer to [facility]. Nurse from 2A taking down patient's belongings to cath lab.

## 2016-05-05 ENCOUNTER — Inpatient Hospital Stay (HOSPITAL_COMMUNITY): Payer: 59

## 2016-05-05 ENCOUNTER — Inpatient Hospital Stay: Admit: 2016-05-05 | Payer: 59 | Admitting: Surgery

## 2016-05-05 ENCOUNTER — Inpatient Hospital Stay (HOSPITAL_COMMUNITY): Payer: 59 | Admitting: Certified Registered Nurse Anesthetist

## 2016-05-05 ENCOUNTER — Encounter: Payer: Self-pay | Admitting: Internal Medicine

## 2016-05-05 ENCOUNTER — Encounter (HOSPITAL_COMMUNITY)
Admission: AD | Disposition: A | Discharge status: Home / Self Care | Payer: Self-pay | Source: Other Acute Inpatient Hospital | Attending: Surgery

## 2016-05-05 DIAGNOSIS — I25110 Atherosclerotic heart disease of native coronary artery with unstable angina pectoris: Secondary | ICD-10-CM

## 2016-05-05 DIAGNOSIS — Z951 Presence of aortocoronary bypass graft: Secondary | ICD-10-CM

## 2016-05-05 HISTORY — PX: TEE WITHOUT CARDIOVERSION: SHX5443

## 2016-05-05 HISTORY — PX: CORONARY ARTERY BYPASS GRAFT: SHX141

## 2016-05-05 LAB — HEMOGLOBIN A1C
HEMOGLOBIN A1C: 5.5 % (ref 4.8–5.6)
HEMOGLOBIN A1C: 5.4 % (ref 4.8–5.6)
Hgb A1c MFr Bld: 5.4 % (ref 4.8–5.6)
MEAN PLASMA GLUCOSE: 108 mg/dL
Mean Plasma Glucose: 111 mg/dL
Mean Plasma Glucose: 108 mg/dL

## 2016-05-05 LAB — PROTIME-INR
INR: 1.37
Prothrombin Time: 17 seconds — ABNORMAL HIGH (ref 11.4–15.2)

## 2016-05-05 LAB — POCT I-STAT, CHEM 8
BUN: 14 mg/dL (ref 6–20)
BUN: 17 mg/dL (ref 6–20)
BUN: 16 mg/dL (ref 6–20)
BUN: 16 mg/dL (ref 6–20)
BUN: 14 mg/dL (ref 6–20)
CHLORIDE: 105 mmol/L (ref 101–111)
CHLORIDE: 103 mmol/L (ref 101–111)
CREATININE: 0.8 mg/dL (ref 0.61–1.24)
Calcium, Ion: 0.95 mmol/L — ABNORMAL LOW (ref 1.15–1.40)
Calcium, Ion: 1.27 mmol/L (ref 1.15–1.40)
Calcium, Ion: 1.12 mmol/L — ABNORMAL LOW (ref 1.15–1.40)
Calcium, Ion: 1.11 mmol/L — ABNORMAL LOW (ref 1.15–1.40)
Calcium, Ion: 1.14 mmol/L — ABNORMAL LOW (ref 1.15–1.40)
Chloride: 103 mmol/L (ref 101–111)
Chloride: 106 mmol/L (ref 101–111)
Chloride: 106 mmol/L (ref 101–111)
Creatinine, Ser: 0.5 mg/dL — ABNORMAL LOW (ref 0.61–1.24)
Creatinine, Ser: 0.9 mg/dL (ref 0.61–1.24)
Creatinine, Ser: 0.8 mg/dL (ref 0.61–1.24)
Creatinine, Ser: 1.1 mg/dL (ref 0.61–1.24)
GLUCOSE: 109 mg/dL — AB (ref 65–99)
GLUCOSE: 177 mg/dL — AB (ref 65–99)
Glucose, Bld: 129 mg/dL — ABNORMAL HIGH (ref 65–99)
Glucose, Bld: 138 mg/dL — ABNORMAL HIGH (ref 65–99)
Glucose, Bld: 156 mg/dL — ABNORMAL HIGH (ref 65–99)
HCT: 27 % — ABNORMAL LOW (ref 39.0–52.0)
HCT: 26 % — ABNORMAL LOW (ref 39.0–52.0)
HEMATOCRIT: 32 % — AB (ref 39.0–52.0)
HEMATOCRIT: 26 % — AB (ref 39.0–52.0)
HEMATOCRIT: 30 % — AB (ref 39.0–52.0)
HEMOGLOBIN: 10.9 g/dL — AB (ref 13.0–17.0)
HEMOGLOBIN: 9.2 g/dL — AB (ref 13.0–17.0)
HEMOGLOBIN: 10.2 g/dL — AB (ref 13.0–17.0)
Hemoglobin: 8.8 g/dL — ABNORMAL LOW (ref 13.0–17.0)
Hemoglobin: 8.8 g/dL — ABNORMAL LOW (ref 13.0–17.0)
POTASSIUM: 4.4 mmol/L (ref 3.5–5.1)
POTASSIUM: 4.5 mmol/L (ref 3.5–5.1)
POTASSIUM: 6.2 mmol/L — AB (ref 3.5–5.1)
POTASSIUM: 4.8 mmol/L (ref 3.5–5.1)
Potassium: 4.7 mmol/L (ref 3.5–5.1)
SODIUM: 139 mmol/L (ref 135–145)
SODIUM: 141 mmol/L (ref 135–145)
SODIUM: 138 mmol/L (ref 135–145)
Sodium: 135 mmol/L (ref 135–145)
Sodium: 136 mmol/L (ref 135–145)
TCO2: 27 mmol/L (ref 0–100)
TCO2: 27 mmol/L (ref 0–100)
TCO2: 28 mmol/L (ref 0–100)
TCO2: 25 mmol/L (ref 0–100)
TCO2: 24 mmol/L (ref 0–100)

## 2016-05-05 LAB — GLUCOSE, CAPILLARY
GLUCOSE-CAPILLARY: 113 mg/dL — AB (ref 65–99)
GLUCOSE-CAPILLARY: 122 mg/dL — AB (ref 65–99)
Glucose-Capillary: 102 mg/dL — ABNORMAL HIGH (ref 65–99)
Glucose-Capillary: 96 mg/dL (ref 65–99)
Glucose-Capillary: 147 mg/dL — ABNORMAL HIGH (ref 65–99)

## 2016-05-05 LAB — POCT I-STAT 3, ART BLOOD GAS (G3+)
ACID-BASE DEFICIT: 2 mmol/L (ref 0.0–2.0)
ACID-BASE DEFICIT: 1 mmol/L (ref 0.0–2.0)
Acid-Base Excess: 1 mmol/L (ref 0.0–2.0)
Acid-base deficit: 2 mmol/L (ref 0.0–2.0)
Acid-base deficit: 1 mmol/L (ref 0.0–2.0)
Acid-base deficit: 3 mmol/L — ABNORMAL HIGH (ref 0.0–2.0)
BICARBONATE: 25.8 mmol/L (ref 20.0–28.0)
BICARBONATE: 25.5 mmol/L (ref 20.0–28.0)
Bicarbonate: 24.7 mmol/L (ref 20.0–28.0)
Bicarbonate: 24 mmol/L (ref 20.0–28.0)
Bicarbonate: 24.8 mmol/L (ref 20.0–28.0)
Bicarbonate: 22.5 mmol/L (ref 20.0–28.0)
O2 SAT: 96 %
O2 SAT: 92 %
O2 Saturation: 100 %
O2 Saturation: 100 %
O2 Saturation: 100 %
O2 Saturation: 97 %
PCO2 ART: 39.8 mmHg (ref 32.0–48.0)
PCO2 ART: 45.7 mmHg (ref 32.0–48.0)
PCO2 ART: 49.2 mmHg — AB (ref 32.0–48.0)
PCO2 ART: 44.9 mmHg (ref 32.0–48.0)
PCO2 ART: 42.1 mmHg (ref 32.0–48.0)
PH ART: 7.327 — AB (ref 7.350–7.450)
PH ART: 7.421 (ref 7.350–7.450)
PH ART: 7.327 — AB (ref 7.350–7.450)
PH ART: 7.318 — AB (ref 7.350–7.450)
PH ART: 7.345 — AB (ref 7.350–7.450)
PH ART: 7.332 — AB (ref 7.350–7.450)
PO2 ART: 451 mmHg — AB (ref 83.0–108.0)
PO2 ART: 93 mmHg (ref 83.0–108.0)
PO2 ART: 82 mmHg — AB (ref 83.0–108.0)
PO2 ART: 65 mmHg — AB (ref 83.0–108.0)
Patient temperature: 36
Patient temperature: 36
Patient temperature: 36.4
TCO2: 26 mmol/L (ref 0–100)
TCO2: 27 mmol/L (ref 0–100)
TCO2: 25 mmol/L (ref 0–100)
TCO2: 27 mmol/L (ref 0–100)
TCO2: 26 mmol/L (ref 0–100)
TCO2: 24 mmol/L (ref 0–100)
pCO2 arterial: 47.1 mmHg (ref 32.0–48.0)
pO2, Arterial: 420 mmHg — ABNORMAL HIGH (ref 83.0–108.0)
pO2, Arterial: 312 mmHg — ABNORMAL HIGH (ref 83.0–108.0)

## 2016-05-05 LAB — CBC
HEMATOCRIT: 39.5 % (ref 39.0–52.0)
HEMATOCRIT: 29 % — AB (ref 39.0–52.0)
HEMATOCRIT: 33 % — AB (ref 39.0–52.0)
HEMOGLOBIN: 14 g/dL (ref 13.0–17.0)
HEMOGLOBIN: 10.4 g/dL — AB (ref 13.0–17.0)
Hemoglobin: 11.6 g/dL — ABNORMAL LOW (ref 13.0–17.0)
MCH: 31.8 pg (ref 26.0–34.0)
MCH: 32.4 pg (ref 26.0–34.0)
MCH: 32 pg (ref 26.0–34.0)
MCHC: 35.4 g/dL (ref 30.0–36.0)
MCHC: 35.9 g/dL (ref 30.0–36.0)
MCHC: 35.2 g/dL (ref 30.0–36.0)
MCV: 89.8 fL (ref 78.0–100.0)
MCV: 90.3 fL (ref 78.0–100.0)
MCV: 90.9 fL (ref 78.0–100.0)
Platelets: 164 10*3/uL (ref 150–400)
Platelets: 98 10*3/uL — ABNORMAL LOW (ref 150–400)
Platelets: 134 10*3/uL — ABNORMAL LOW (ref 150–400)
RBC: 4.4 MIL/uL (ref 4.22–5.81)
RBC: 3.21 MIL/uL — ABNORMAL LOW (ref 4.22–5.81)
RBC: 3.63 MIL/uL — ABNORMAL LOW (ref 4.22–5.81)
RDW: 13.1 % (ref 11.5–15.5)
RDW: 13.4 % (ref 11.5–15.5)
RDW: 13.6 % (ref 11.5–15.5)
WBC: 7.7 10*3/uL (ref 4.0–10.5)
WBC: 7.1 10*3/uL (ref 4.0–10.5)
WBC: 7.5 10*3/uL (ref 4.0–10.5)

## 2016-05-05 LAB — HIV ANTIBODY (ROUTINE TESTING W REFLEX): HIV SCREEN 4TH GENERATION: NONREACTIVE

## 2016-05-05 LAB — POCT I-STAT 4, (NA,K, GLUC, HGB,HCT)
GLUCOSE: 126 mg/dL — AB (ref 65–99)
HEMATOCRIT: 27 % — AB (ref 39.0–52.0)
HEMOGLOBIN: 9.2 g/dL — AB (ref 13.0–17.0)
POTASSIUM: 3.9 mmol/L (ref 3.5–5.1)
Sodium: 140 mmol/L (ref 135–145)

## 2016-05-05 LAB — BASIC METABOLIC PANEL
Anion gap: 9 (ref 5–15)
BUN: 14 mg/dL (ref 6–20)
CHLORIDE: 108 mmol/L (ref 101–111)
CO2: 22 mmol/L (ref 22–32)
CREATININE: 1.24 mg/dL (ref 0.61–1.24)
Calcium: 9.2 mg/dL (ref 8.9–10.3)
GFR calc Af Amer: >60 mL/min (ref >60)
GFR calc non Af Amer: >60 mL/min (ref >60)
GLUCOSE: 172 mg/dL — AB (ref 65–99)
Potassium: 4.3 mmol/L (ref 3.5–5.1)
Sodium: 139 mmol/L (ref 135–145)

## 2016-05-05 LAB — MAGNESIUM: MAGNESIUM: 3.4 mg/dL — AB (ref 1.7–2.4)

## 2016-05-05 LAB — APTT: APTT: 25 s (ref 24–36)

## 2016-05-05 LAB — CREATININE, SERUM: Creatinine, Ser: 1.18 mg/dL (ref 0.61–1.24)

## 2016-05-05 LAB — ECHO TEE: FS: 29 % (ref 28–44)

## 2016-05-05 LAB — PLATELET COUNT: PLATELETS: 122 10*3/uL — AB (ref 150–400)

## 2016-05-05 LAB — HEMOGLOBIN AND HEMATOCRIT, BLOOD: HEMATOCRIT: 28 % — AB (ref 39.0–52.0)

## 2016-05-05 LAB — HEPARIN LEVEL (UNFRACTIONATED): Heparin Unfractionated: 0.39 IU/mL (ref 0.30–0.70)

## 2016-05-05 SURGERY — CORONARY ARTERY BYPASS GRAFTING (CABG)
Anesthesia: General | Site: Chest

## 2016-05-05 MED ORDER — ORAL CARE MOUTH RINSE
15.0000 mL | Freq: Two times a day (BID) | OROMUCOSAL | Status: DC
  Administered 2016-05-05 – 2016-05-06 (×2): 15 mL via OROMUCOSAL

## 2016-05-05 MED ORDER — SODIUM CHLORIDE 0.9% FLUSH
3.0000 mL | Freq: Two times a day (BID) | INTRAVENOUS | Status: DC
  Administered 2016-05-06 (×2): 3 mL via INTRAVENOUS

## 2016-05-05 MED ORDER — PANTOPRAZOLE SODIUM 40 MG PO TBEC: 40.0000 mg | DELAYED_RELEASE_TABLET | Freq: Every day | ORAL | Status: DC

## 2016-05-05 MED ORDER — PROTAMINE SULFATE 10 MG/ML IV SOLN
INTRAVENOUS | Status: DC | PRN
  Administered 2016-05-05: 30 mg via INTRAVENOUS
  Administered 2016-05-05: 340 mg via INTRAVENOUS

## 2016-05-05 MED ORDER — HEPARIN SODIUM (PORCINE) 1000 UNIT/ML IJ SOLN
INTRAMUSCULAR | Status: AC
  Filled 2016-05-05: qty 1

## 2016-05-05 MED ORDER — 0.9 % SODIUM CHLORIDE (POUR BTL) OPTIME
TOPICAL | Status: DC | PRN
  Administered 2016-05-05: 6000 mL

## 2016-05-05 MED ORDER — LACTATED RINGERS IV SOLN
INTRAVENOUS | Status: DC | PRN
  Administered 2016-05-05 (×2): via INTRAVENOUS

## 2016-05-05 MED ORDER — SUCCINYLCHOLINE CHLORIDE 200 MG/10ML IV SOSY
PREFILLED_SYRINGE | INTRAVENOUS | Status: AC
  Filled 2016-05-05: qty 10

## 2016-05-05 MED ORDER — ROCURONIUM BROMIDE 50 MG/5ML IV SOSY
PREFILLED_SYRINGE | INTRAVENOUS | Status: AC
  Filled 2016-05-05: qty 5

## 2016-05-05 MED ORDER — METOPROLOL TARTRATE 25 MG/10 ML ORAL SUSPENSION: 12.5000 mg | Freq: Two times a day (BID) | ORAL | Status: DC

## 2016-05-05 MED ORDER — ALBUTEROL SULFATE HFA 108 (90 BASE) MCG/ACT IN AERS: 2.0000 | INHALATION_SPRAY | RESPIRATORY_TRACT | Status: DC | PRN

## 2016-05-05 MED ORDER — INSULIN REGULAR BOLUS VIA INFUSION
0.0000 [IU] | Freq: Three times a day (TID) | INTRAVENOUS | Status: DC
  Filled 2016-05-05: qty 10

## 2016-05-05 MED ORDER — SODIUM CHLORIDE 0.9 % IV SOLN
INTRAVENOUS | Status: DC
  Administered 2016-05-05: 15:00:00 via INTRAVENOUS

## 2016-05-05 MED ORDER — METOPROLOL TARTRATE 12.5 MG HALF TABLET
12.5000 mg | ORAL_TABLET | Freq: Two times a day (BID) | ORAL | Status: DC
  Administered 2016-05-06: 12.5 mg via ORAL
  Filled 2016-05-05: qty 1

## 2016-05-05 MED ORDER — EPHEDRINE 5 MG/ML INJ
INTRAVENOUS | Status: AC
  Filled 2016-05-05: qty 10

## 2016-05-05 MED ORDER — LACTATED RINGERS IV SOLN
INTRAVENOUS | Status: DC | PRN
  Administered 2016-05-05: 08:00:00 via INTRAVENOUS

## 2016-05-05 MED ORDER — ACETAMINOPHEN 160 MG/5ML PO SOLN: 1000.0000 mg | Freq: Four times a day (QID) | ORAL | Status: DC

## 2016-05-05 MED ORDER — ALBUMIN HUMAN 5 % IV SOLN
INTRAVENOUS | Status: DC | PRN
  Administered 2016-05-05: 13:00:00 via INTRAVENOUS

## 2016-05-05 MED ORDER — DEXTROSE 5 % IV SOLN
1.5000 g | Freq: Two times a day (BID) | INTRAVENOUS | Status: AC
  Administered 2016-05-05 – 2016-05-07 (×4): 1.5 g via INTRAVENOUS
  Filled 2016-05-05 (×4): qty 1.5

## 2016-05-05 MED ORDER — PROTAMINE SULFATE 10 MG/ML IV SOLN
INTRAVENOUS | Status: AC
  Filled 2016-05-05: qty 25

## 2016-05-05 MED ORDER — THROMBIN 20000 UNITS EX SOLR
OROMUCOSAL | Status: DC | PRN
  Administered 2016-05-05 (×3): 4 mL via TOPICAL

## 2016-05-05 MED ORDER — ORAL CARE MOUTH RINSE: 15.0000 mL | Freq: Four times a day (QID) | OROMUCOSAL | Status: DC

## 2016-05-05 MED ORDER — ALBUTEROL SULFATE (2.5 MG/3ML) 0.083% IN NEBU
2.5000 mg | INHALATION_SOLUTION | RESPIRATORY_TRACT | Status: DC | PRN
  Administered 2016-05-06 – 2016-05-09 (×5): 2.5 mg via RESPIRATORY_TRACT
  Filled 2016-05-05 (×5): qty 3

## 2016-05-05 MED ORDER — SODIUM CHLORIDE 0.9 % IV SOLN
30.0000 meq | Freq: Once | INTRAVENOUS | Status: AC
  Administered 2016-05-05: 30 meq via INTRAVENOUS
  Filled 2016-05-05: qty 15

## 2016-05-05 MED ORDER — LACTATED RINGERS IV SOLN: INTRAVENOUS | Status: DC

## 2016-05-05 MED ORDER — LACTATED RINGERS IV SOLN
500.0000 mL | Freq: Once | INTRAVENOUS | Status: AC | PRN
  Administered 2016-05-06: 500 mL via INTRAVENOUS

## 2016-05-05 MED ORDER — CHLORHEXIDINE GLUCONATE 0.12 % MT SOLN
15.0000 mL | OROMUCOSAL | Status: AC
  Administered 2016-05-05: 15 mL via OROMUCOSAL

## 2016-05-05 MED ORDER — SODIUM CHLORIDE 0.9 % IV SOLN: 250.0000 mL | INTRAVENOUS | Status: DC

## 2016-05-05 MED ORDER — DOCUSATE SODIUM 100 MG PO CAPS
200.0000 mg | ORAL_CAPSULE | Freq: Every day | ORAL | Status: DC
  Administered 2016-05-06: 200 mg via ORAL
  Filled 2016-05-05: qty 2

## 2016-05-05 MED ORDER — THROMBIN 20000 UNITS EX SOLR
CUTANEOUS | Status: AC
  Filled 2016-05-05: qty 20000

## 2016-05-05 MED ORDER — ACETAMINOPHEN 160 MG/5ML PO SOLN: 650.0000 mg | Freq: Once | ORAL | Status: AC

## 2016-05-05 MED ORDER — METOCLOPRAMIDE HCL 5 MG/ML IJ SOLN
10.0000 mg | Freq: Four times a day (QID) | INTRAMUSCULAR | Status: AC
  Administered 2016-05-05 – 2016-05-06 (×3): 10 mg via INTRAVENOUS
  Filled 2016-05-05 (×2): qty 2

## 2016-05-05 MED ORDER — CHLORHEXIDINE GLUCONATE 0.12% ORAL RINSE (MEDLINE KIT): 15.0000 mL | Freq: Two times a day (BID) | OROMUCOSAL | Status: DC

## 2016-05-05 MED ORDER — PHENYLEPHRINE HCL 10 MG/ML IJ SOLN
INTRAVENOUS | Status: DC | PRN
  Administered 2016-05-05: 20 ug/min via INTRAVENOUS

## 2016-05-05 MED ORDER — DEXMEDETOMIDINE HCL IN NACL 200 MCG/50ML IV SOLN: 0.0000 ug/kg/h | INTRAVENOUS | Status: DC

## 2016-05-05 MED ORDER — LIDOCAINE 2% (20 MG/ML) 5 ML SYRINGE: INTRAMUSCULAR | Status: DC | PRN

## 2016-05-05 MED ORDER — LIDOCAINE 2% (20 MG/ML) 5 ML SYRINGE
INTRAMUSCULAR | Status: AC
  Filled 2016-05-05: qty 5

## 2016-05-05 MED ORDER — MIDAZOLAM HCL 5 MG/5ML IJ SOLN
INTRAMUSCULAR | Status: DC | PRN
  Administered 2016-05-05: 3 mg via INTRAVENOUS
  Administered 2016-05-05 (×2): 1 mg via INTRAVENOUS
  Administered 2016-05-05: 3 mg via INTRAVENOUS
  Administered 2016-05-05: 2 mg via INTRAVENOUS

## 2016-05-05 MED ORDER — SODIUM CHLORIDE 0.9% FLUSH
3.0000 mL | INTRAVENOUS | Status: DC | PRN
Start: 2016-05-06 — End: 2016-05-07

## 2016-05-05 MED ORDER — ASPIRIN EC 325 MG PO TBEC
325.0000 mg | DELAYED_RELEASE_TABLET | Freq: Every day | ORAL | Status: DC
  Administered 2016-05-06: 325 mg via ORAL
  Filled 2016-05-05: qty 1

## 2016-05-05 MED ORDER — ONDANSETRON HCL 4 MG/2ML IJ SOLN
4.0000 mg | Freq: Four times a day (QID) | INTRAMUSCULAR | Status: DC | PRN
  Administered 2016-05-07: 4 mg via INTRAVENOUS
  Filled 2016-05-05: qty 2

## 2016-05-05 MED ORDER — NOREPINEPHRINE BITARTRATE 1 MG/ML IV SOLN
0.0000 ug/min | INTRAVENOUS | Status: DC
  Filled 2016-05-05: qty 4

## 2016-05-05 MED ORDER — THROMBIN 20000 UNITS EX SOLR
CUTANEOUS | Status: DC | PRN
  Administered 2016-05-05: 20000 [IU] via TOPICAL

## 2016-05-05 MED ORDER — FENTANYL CITRATE (PF) 250 MCG/5ML IJ SOLN
INTRAMUSCULAR | Status: AC
  Filled 2016-05-05: qty 25

## 2016-05-05 MED ORDER — ACETAMINOPHEN 650 MG RE SUPP
650.0000 mg | Freq: Once | RECTAL | Status: AC
  Administered 2016-05-05: 650 mg via RECTAL
  Filled 2016-05-05: qty 1

## 2016-05-05 MED ORDER — FAMOTIDINE IN NACL 20-0.9 MG/50ML-% IV SOLN
20.0000 mg | Freq: Two times a day (BID) | INTRAVENOUS | Status: AC
  Administered 2016-05-05: 20 mg via INTRAVENOUS

## 2016-05-05 MED ORDER — OXYCODONE HCL 5 MG PO TABS
5.0000 mg | ORAL_TABLET | ORAL | Status: DC | PRN
  Administered 2016-05-05: 10 mg via ORAL
  Filled 2016-05-05: qty 2

## 2016-05-05 MED ORDER — NITROGLYCERIN IN D5W 200-5 MCG/ML-% IV SOLN: 0.0000 ug/min | INTRAVENOUS | Status: DC

## 2016-05-05 MED ORDER — ATORVASTATIN CALCIUM 40 MG PO TABS
40.0000 mg | ORAL_TABLET | Freq: Every day | ORAL | Status: DC
  Administered 2016-05-06 – 2016-05-09 (×4): 40 mg via ORAL
  Filled 2016-05-05 (×4): qty 1

## 2016-05-05 MED ORDER — SODIUM CHLORIDE 0.45 % IV SOLN
INTRAVENOUS | Status: DC | PRN
  Administered 2016-05-05: 15:00:00 via INTRAVENOUS

## 2016-05-05 MED ORDER — SODIUM CHLORIDE 0.9 % IV SOLN
INTRAVENOUS | Status: DC
  Filled 2016-05-05: qty 2.5

## 2016-05-05 MED ORDER — ACETAMINOPHEN 500 MG PO TABS
1000.0000 mg | ORAL_TABLET | Freq: Four times a day (QID) | ORAL | Status: DC
  Administered 2016-05-05 – 2016-05-07 (×6): 1000 mg via ORAL
  Filled 2016-05-05 (×6): qty 2

## 2016-05-05 MED ORDER — DIAZEPAM 5 MG PO TABS
ORAL_TABLET | ORAL | Status: AC
  Administered 2016-05-05: 06:00:00
  Filled 2016-05-05: qty 1

## 2016-05-05 MED ORDER — PHENYLEPHRINE 40 MCG/ML (10ML) SYRINGE FOR IV PUSH (FOR BLOOD PRESSURE SUPPORT)
PREFILLED_SYRINGE | INTRAVENOUS | Status: DC | PRN
  Administered 2016-05-05: 80 ug via INTRAVENOUS
  Administered 2016-05-05 (×2): 40 ug via INTRAVENOUS

## 2016-05-05 MED ORDER — ROCURONIUM BROMIDE 10 MG/ML (PF) SYRINGE
PREFILLED_SYRINGE | INTRAVENOUS | Status: DC | PRN
  Administered 2016-05-05 (×6): 50 mg via INTRAVENOUS

## 2016-05-05 MED ORDER — BISACODYL 5 MG PO TBEC
10.0000 mg | DELAYED_RELEASE_TABLET | Freq: Every day | ORAL | Status: DC
  Administered 2016-05-06: 10 mg via ORAL
  Filled 2016-05-05: qty 2

## 2016-05-05 MED ORDER — MORPHINE SULFATE (PF) 2 MG/ML IV SOLN
1.0000 mg | INTRAVENOUS | Status: AC | PRN
  Administered 2016-05-05: 4 mg via INTRAVENOUS
  Administered 2016-05-05: 2 mg via INTRAVENOUS

## 2016-05-05 MED ORDER — MIDAZOLAM HCL 2 MG/2ML IJ SOLN
2.0000 mg | INTRAMUSCULAR | Status: DC | PRN
  Filled 2016-05-05: qty 2

## 2016-05-05 MED ORDER — PROPOFOL 10 MG/ML IV BOLUS
INTRAVENOUS | Status: AC
  Filled 2016-05-05: qty 20

## 2016-05-05 MED ORDER — ARTIFICIAL TEARS OP OINT
TOPICAL_OINTMENT | OPHTHALMIC | Status: DC | PRN
  Administered 2016-05-05: 1 via OPHTHALMIC

## 2016-05-05 MED ORDER — PROTAMINE SULFATE 10 MG/ML IV SOLN
INTRAVENOUS | Status: AC
  Filled 2016-05-05: qty 10

## 2016-05-05 MED ORDER — FENTANYL CITRATE (PF) 250 MCG/5ML IJ SOLN
INTRAMUSCULAR | Status: DC | PRN
  Administered 2016-05-05 (×2): 50 ug via INTRAVENOUS
  Administered 2016-05-05 (×2): 150 ug via INTRAVENOUS
  Administered 2016-05-05: 100 ug via INTRAVENOUS
  Administered 2016-05-05: 50 ug via INTRAVENOUS
  Administered 2016-05-05: 500 ug via INTRAVENOUS
  Administered 2016-05-05 (×3): 100 ug via INTRAVENOUS
  Administered 2016-05-05: 150 ug via INTRAVENOUS

## 2016-05-05 MED ORDER — VANCOMYCIN HCL IN DEXTROSE 1-5 GM/200ML-% IV SOLN
1000.0000 mg | Freq: Once | INTRAVENOUS | Status: AC
  Administered 2016-05-05: 1000 mg via INTRAVENOUS
  Filled 2016-05-05: qty 200

## 2016-05-05 MED ORDER — LACTATED RINGERS IV SOLN
INTRAVENOUS | Status: DC
  Administered 2016-05-05 (×2): via INTRAVENOUS

## 2016-05-05 MED ORDER — BISACODYL 10 MG RE SUPP: 10.0000 mg | Freq: Every day | RECTAL | Status: DC

## 2016-05-05 MED ORDER — LIDOCAINE 2% (20 MG/ML) 5 ML SYRINGE
INTRAMUSCULAR | Status: DC | PRN
  Administered 2016-05-05: 100 mg via INTRAVENOUS

## 2016-05-05 MED ORDER — ALBUMIN HUMAN 5 % IV SOLN: 250.0000 mL | INTRAVENOUS | Status: AC | PRN

## 2016-05-05 MED ORDER — ARTIFICIAL TEARS OP OINT
TOPICAL_OINTMENT | OPHTHALMIC | Status: AC
  Filled 2016-05-05: qty 3.5

## 2016-05-05 MED ORDER — INSULIN ASPART 100 UNIT/ML ~~LOC~~ SOLN
0.0000 [IU] | SUBCUTANEOUS | Status: DC
  Administered 2016-05-05 – 2016-05-06 (×2): 2 [IU] via SUBCUTANEOUS

## 2016-05-05 MED ORDER — SUCCINYLCHOLINE CHLORIDE 200 MG/10ML IV SOSY
PREFILLED_SYRINGE | INTRAVENOUS | Status: DC | PRN
  Administered 2016-05-05: 100 mg via INTRAVENOUS

## 2016-05-05 MED ORDER — NITROGLYCERIN 0.2 MG/ML ON CALL CATH LAB
INTRAVENOUS | Status: DC | PRN
  Administered 2016-05-05: 40 ug via INTRAVENOUS
  Administered 2016-05-05: 20 ug via INTRAVENOUS
  Administered 2016-05-05 (×2): 40 ug via INTRAVENOUS
  Administered 2016-05-05: 20 ug via INTRAVENOUS
  Administered 2016-05-05: 40 ug via INTRAVENOUS

## 2016-05-05 MED ORDER — SODIUM CHLORIDE 0.9 % IV SOLN
INTRAVENOUS | Status: DC | PRN
  Administered 2016-05-05: 14:00:00 via INTRAVENOUS

## 2016-05-05 MED ORDER — MAGNESIUM SULFATE 4 GM/100ML IV SOLN
4.0000 g | Freq: Once | INTRAVENOUS | Status: AC
  Administered 2016-05-05: 4 g via INTRAVENOUS
  Filled 2016-05-05: qty 100

## 2016-05-05 MED ORDER — PROPOFOL 10 MG/ML IV BOLUS
INTRAVENOUS | Status: DC | PRN
  Administered 2016-05-05: 70 mg via INTRAVENOUS
  Administered 2016-05-05: 50 mg via INTRAVENOUS
  Administered 2016-05-05 (×2): 30 mg via INTRAVENOUS

## 2016-05-05 MED ORDER — MORPHINE SULFATE (PF) 2 MG/ML IV SOLN
2.0000 mg | INTRAVENOUS | Status: DC | PRN
  Administered 2016-05-05 (×2): 4 mg via INTRAVENOUS
  Administered 2016-05-06 (×2): 2 mg via INTRAVENOUS
  Filled 2016-05-05 (×3): qty 2
  Filled 2016-05-05: qty 1
  Filled 2016-05-05: qty 2
  Filled 2016-05-05: qty 1
  Filled 2016-05-05: qty 2

## 2016-05-05 MED ORDER — HEMOSTATIC AGENTS (NO CHARGE) OPTIME
TOPICAL | Status: DC | PRN
  Administered 2016-05-05: 1 via TOPICAL

## 2016-05-05 MED ORDER — PHENYLEPHRINE 40 MCG/ML (10ML) SYRINGE FOR IV PUSH (FOR BLOOD PRESSURE SUPPORT)
PREFILLED_SYRINGE | INTRAVENOUS | Status: AC
  Filled 2016-05-05: qty 10

## 2016-05-05 MED ORDER — TRAMADOL HCL 50 MG PO TABS
50.0000 mg | ORAL_TABLET | ORAL | Status: DC | PRN
  Administered 2016-05-06: 100 mg via ORAL
  Filled 2016-05-05: qty 2

## 2016-05-05 MED ORDER — FENTANYL CITRATE (PF) 250 MCG/5ML IJ SOLN
INTRAMUSCULAR | Status: AC
  Filled 2016-05-05: qty 5

## 2016-05-05 MED ORDER — ASPIRIN 81 MG PO CHEW: 324.0000 mg | CHEWABLE_TABLET | Freq: Every day | ORAL | Status: DC

## 2016-05-05 MED ORDER — METOPROLOL TARTRATE 5 MG/5ML IV SOLN: 2.5000 mg | INTRAVENOUS | Status: DC | PRN

## 2016-05-05 MED ORDER — MIDAZOLAM HCL 10 MG/2ML IJ SOLN
INTRAMUSCULAR | Status: AC
  Filled 2016-05-05: qty 2

## 2016-05-05 MED ORDER — SODIUM CHLORIDE 0.9 % IV SOLN
0.0000 ug/min | INTRAVENOUS | Status: DC
  Administered 2016-05-06: 15 ug/min via INTRAVENOUS
  Filled 2016-05-05 (×2): qty 2

## 2016-05-05 MED ORDER — HEPARIN SODIUM (PORCINE) 1000 UNIT/ML IJ SOLN
INTRAMUSCULAR | Status: DC | PRN
  Administered 2016-05-05: 37000 [IU] via INTRAVENOUS
  Administered 2016-05-05: 10000 [IU] via INTRAVENOUS

## 2016-05-05 MED ORDER — PROTAMINE SULFATE 10 MG/ML IV SOLN
INTRAVENOUS | Status: AC
  Filled 2016-05-05: qty 5

## 2016-05-05 MED ORDER — MONTELUKAST SODIUM 10 MG PO TABS
10.0000 mg | ORAL_TABLET | Freq: Every day | ORAL | Status: DC
Start: 2016-05-05 — End: 2016-05-10
  Administered 2016-05-05 – 2016-05-09 (×5): 10 mg via ORAL
  Filled 2016-05-05 (×5): qty 1

## 2016-05-05 MED FILL — Potassium Chloride Inj 2 mEq/ML: INTRAVENOUS | Qty: 40 | Status: AC

## 2016-05-05 MED FILL — Magnesium Sulfate Inj 50%: INTRAMUSCULAR | Qty: 10 | Status: AC

## 2016-05-05 MED FILL — Heparin Sodium (Porcine) Inj 1000 Unit/ML: INTRAMUSCULAR | Qty: 30 | Status: AC

## 2016-05-05 SURGICAL SUPPLY — 106 items
ADH SKN CLS APL DERMABOND .7 (GAUZE/BANDAGES/DRESSINGS) ×2
BAG DECANTER FOR FLEXI CONT (MISCELLANEOUS) ×4 IMPLANT
BANDAGE ACE 4X5 VEL STRL LF (GAUZE/BANDAGES/DRESSINGS) ×4 IMPLANT
BANDAGE ACE 6X5 VEL STRL LF (GAUZE/BANDAGES/DRESSINGS) ×4 IMPLANT
BASKET HEART  (ORDER IN 25'S) (MISCELLANEOUS) ×1
BASKET HEART (ORDER IN 25'S) (MISCELLANEOUS) ×1
BASKET HEART (ORDER IN 25S) (MISCELLANEOUS) ×2 IMPLANT
BLADE STERNUM SYSTEM 6 (BLADE) ×4 IMPLANT
BLADE SURG 11 STRL SS (BLADE) ×4 IMPLANT
BNDG GAUZE ELAST 4 BULKY (GAUZE/BANDAGES/DRESSINGS) ×4 IMPLANT
CANISTER SUCT 3000ML PPV (MISCELLANEOUS) ×4 IMPLANT
CATH ROBINSON RED A/P 18FR (CATHETERS) ×8 IMPLANT
CATH THORACIC 28FR (CATHETERS) ×4 IMPLANT
CATH THORACIC 36FR (CATHETERS) ×4 IMPLANT
CATH THORACIC 36FR RT ANG (CATHETERS) ×4 IMPLANT
CLIP TI MEDIUM 24 (CLIP) IMPLANT
CLIP TI WIDE RED SMALL 24 (CLIP) ×4 IMPLANT
CRADLE DONUT ADULT HEAD (MISCELLANEOUS) ×4 IMPLANT
DERMABOND ADVANCED (GAUZE/BANDAGES/DRESSINGS) ×2
DERMABOND ADVANCED .7 DNX12 (GAUZE/BANDAGES/DRESSINGS) ×2 IMPLANT
DRAPE CARDIOVASCULAR INCISE (DRAPES) ×4
DRAPE SLUSH/WARMER DISC (DRAPES) ×4 IMPLANT
DRAPE SRG 135X102X78XABS (DRAPES) ×2 IMPLANT
DRSG COVADERM 4X14 (GAUZE/BANDAGES/DRESSINGS) ×4 IMPLANT
ELECT CAUTERY BLADE 6.4 (BLADE) ×4 IMPLANT
ELECT REM PT RETURN 9FT ADLT (ELECTROSURGICAL) ×8
ELECTRODE REM PT RTRN 9FT ADLT (ELECTROSURGICAL) ×4 IMPLANT
FELT TEFLON 1X6 (MISCELLANEOUS) ×8 IMPLANT
GAUZE SPONGE 4X4 12PLY STRL (GAUZE/BANDAGES/DRESSINGS) ×8 IMPLANT
GAUZE SPONGE 4X4 12PLY STRL LF (GAUZE/BANDAGES/DRESSINGS) ×8 IMPLANT
GLOVE BIO SURGEON STRL SZ 6 (GLOVE) IMPLANT
GLOVE BIO SURGEON STRL SZ 6.5 (GLOVE) ×21 IMPLANT
GLOVE BIO SURGEON STRL SZ7 (GLOVE) IMPLANT
GLOVE BIO SURGEON STRL SZ7.5 (GLOVE) IMPLANT
GLOVE BIO SURGEONS STRL SZ 6.5 (GLOVE) ×7
GLOVE BIOGEL PI IND STRL 6 (GLOVE) ×2 IMPLANT
GLOVE BIOGEL PI IND STRL 6.5 (GLOVE) ×6 IMPLANT
GLOVE BIOGEL PI IND STRL 7.0 (GLOVE) IMPLANT
GLOVE BIOGEL PI INDICATOR 6 (GLOVE) ×2
GLOVE BIOGEL PI INDICATOR 6.5 (GLOVE) ×6
GLOVE BIOGEL PI INDICATOR 7.0 (GLOVE)
GLOVE EUDERMIC 7 POWDERFREE (GLOVE) ×8 IMPLANT
GLOVE ORTHO TXT STRL SZ7.5 (GLOVE) IMPLANT
GOWN STRL REUS W/ TWL LRG LVL3 (GOWN DISPOSABLE) ×16 IMPLANT
GOWN STRL REUS W/ TWL XL LVL3 (GOWN DISPOSABLE) ×2 IMPLANT
GOWN STRL REUS W/TWL LRG LVL3 (GOWN DISPOSABLE) ×32
GOWN STRL REUS W/TWL XL LVL3 (GOWN DISPOSABLE) ×4
HEMOSTAT POWDER SURGIFOAM 1G (HEMOSTASIS) ×12 IMPLANT
HEMOSTAT SURGICEL 2X14 (HEMOSTASIS) ×4 IMPLANT
INSERT FOGARTY 61MM (MISCELLANEOUS) IMPLANT
INSERT FOGARTY XLG (MISCELLANEOUS) IMPLANT
KIT BASIN OR (CUSTOM PROCEDURE TRAY) ×4 IMPLANT
KIT CATH CPB BARTLE (MISCELLANEOUS) ×4 IMPLANT
KIT ROOM TURNOVER OR (KITS) ×4 IMPLANT
KIT SUCTION CATH 14FR (SUCTIONS) ×4 IMPLANT
KIT VASOVIEW HEMOPRO VH 3000 (KITS) ×4 IMPLANT
NS IRRIG 1000ML POUR BTL (IV SOLUTION) ×24 IMPLANT
PACK OPEN HEART (CUSTOM PROCEDURE TRAY) ×4 IMPLANT
PAD ARMBOARD 7.5X6 YLW CONV (MISCELLANEOUS) ×8 IMPLANT
PAD ELECT DEFIB RADIOL ZOLL (MISCELLANEOUS) ×4 IMPLANT
PENCIL BUTTON HOLSTER BLD 10FT (ELECTRODE) ×4 IMPLANT
PUNCH AORTIC ROTATE 4.0MM (MISCELLANEOUS) IMPLANT
PUNCH AORTIC ROTATE 4.5MM 8IN (MISCELLANEOUS) ×4 IMPLANT
PUNCH AORTIC ROTATE 5MM 8IN (MISCELLANEOUS) IMPLANT
SET CARDIOPLEGIA MPS 5001102 (MISCELLANEOUS) ×4 IMPLANT
SOLUTION ANTI FOG 6CC (MISCELLANEOUS) ×4 IMPLANT
SPONGE INTESTINAL PEANUT (DISPOSABLE) IMPLANT
SPONGE LAP 18X18 X RAY DECT (DISPOSABLE) ×8 IMPLANT
SPONGE LAP 4X18 X RAY DECT (DISPOSABLE) ×4 IMPLANT
SUT BONE WAX W31G (SUTURE) ×4 IMPLANT
SUT MNCRL AB 4-0 PS2 18 (SUTURE) ×4 IMPLANT
SUT PROLENE 3 0 SH DA (SUTURE) IMPLANT
SUT PROLENE 3 0 SH1 36 (SUTURE) ×4 IMPLANT
SUT PROLENE 4 0 RB 1 (SUTURE)
SUT PROLENE 4 0 SH DA (SUTURE) IMPLANT
SUT PROLENE 4-0 RB1 .5 CRCL 36 (SUTURE) IMPLANT
SUT PROLENE 5 0 C 1 36 (SUTURE) IMPLANT
SUT PROLENE 6 0 C 1 30 (SUTURE) ×4 IMPLANT
SUT PROLENE 7 0 BV 1 (SUTURE) ×4 IMPLANT
SUT PROLENE 7 0 BV1 MDA (SUTURE) ×4 IMPLANT
SUT PROLENE 8 0 BV175 6 (SUTURE) IMPLANT
SUT SILK  1 MH (SUTURE)
SUT SILK 1 MH (SUTURE) IMPLANT
SUT STEEL STERNAL CCS#1 18IN (SUTURE) IMPLANT
SUT STEEL SZ 6 DBL 3X14 BALL (SUTURE) IMPLANT
SUT VIC AB 1 CTX 36 (SUTURE) ×12
SUT VIC AB 1 CTX36XBRD ANBCTR (SUTURE) ×6 IMPLANT
SUT VIC AB 2-0 CT1 27 (SUTURE) ×4
SUT VIC AB 2-0 CT1 TAPERPNT 27 (SUTURE) ×2 IMPLANT
SUT VIC AB 2-0 CTX 27 (SUTURE) IMPLANT
SUT VIC AB 3-0 SH 27 (SUTURE)
SUT VIC AB 3-0 SH 27X BRD (SUTURE) IMPLANT
SUT VIC AB 3-0 X1 27 (SUTURE) IMPLANT
SUT VICRYL 4-0 PS2 18IN ABS (SUTURE) IMPLANT
SUTURE E-PAK OPEN HEART (SUTURE) ×4 IMPLANT
SYSTEM SAHARA CHEST DRAIN ATS (WOUND CARE) ×4 IMPLANT
TAPE CLOTH SURG 4X10 WHT LF (GAUZE/BANDAGES/DRESSINGS) ×4 IMPLANT
TAPE PAPER 2X10 WHT MICROPORE (GAUZE/BANDAGES/DRESSINGS) ×4 IMPLANT
TOWEL GREEN STERILE (TOWEL DISPOSABLE) ×16 IMPLANT
TOWEL GREEN STERILE FF (TOWEL DISPOSABLE) ×8 IMPLANT
TOWEL OR 17X24 6PK STRL BLUE (TOWEL DISPOSABLE) ×4 IMPLANT
TOWEL OR 17X26 10 PK STRL BLUE (TOWEL DISPOSABLE) ×4 IMPLANT
TRAY FOLEY IC TEMP SENS 16FR (CATHETERS) ×4 IMPLANT
TUBING INSUFFLATION (TUBING) ×4 IMPLANT
UNDERPAD 30X30 (UNDERPADS AND DIAPERS) ×4 IMPLANT
WATER STERILE IRR 1000ML POUR (IV SOLUTION) ×8 IMPLANT

## 2016-05-05 NOTE — Anesthesia Postprocedure Evaluation (Addendum)
Anesthesia Post Note  Patient: DENARIO BAGOT  Procedure(s) Performed: Procedure(s) (LRB): CORONARY ARTERY BYPASS GRAFTING (CABG) x four , using left internal mammary artery and right leg greater saphenous vein harvested endoscopically (N/A) TRANSESOPHAGEAL ECHOCARDIOGRAM (TEE) (N/A)  Patient location during evaluation: SICU Anesthesia Type: General Level of consciousness: sedated Pain management: pain level controlled Vital Signs Assessment: post-procedure vital signs reviewed and stable Respiratory status: patient remains intubated per anesthesia plan Cardiovascular status: stable Anesthetic complications: no       Last Vitals:  Vitals:   05/05/16 0544 05/05/16 1430  BP:  (!) 95/52  Pulse: 68 72  Resp:  12  Temp:      Last Pain:  Vitals:   05/05/16 0500  TempSrc: Oral                 Lorenza Winkleman,W. EDMOND

## 2016-05-05 NOTE — Procedures (Signed)
Extubation Procedure Note  Patient Details:   Name: Alan Fernandez DOB: [birth year] MRN: [id number]   Airway Documentation:     Evaluation  O2 sats: stable throughout Complications: No apparent complications Patient did tolerate procedure well. Bilateral Breath Sounds: Clear   Pt extubated per protocol, + cuff leak, VC 1200, NIF -30, placed pt on 4L [name] tolerating well, no distress noted at this time, pt able to voice  [name] 05/05/2016, 6:10 PM

## 2016-05-05 NOTE — Plan of Care (Signed)
Problem: Education: Goal: Knowledge of [facility] Education information/materials will improve Outcome: Progressing Pt watched the [facility] open heart surgery video with wife. Pt has no additional questions at this time after watching the video. Will continue to monitor and assess pt for learning needs.

## 2016-05-05 NOTE — Anesthesia Procedure Notes (Signed)
Central Venous Catheter Insertion Performed by: Roderic Palau, anesthesiologist Start/End3/14/2018 7:45 AM, 05/05/2016 7:55 AM Patient location: Pre-op. Preanesthetic checklist: patient identified, IV checked, site marked, risks and benefits discussed, surgical consent, monitors and equipment checked, pre-op evaluation, timeout performed and anesthesia consent Position: Trendelenburg Lidocaine 1% used for infiltration and patient sedated Hand hygiene performed , maximum sterile barriers used  and Seldinger technique used Catheter size: 9 Fr Total catheter length 16. Central line and PA cath was placed.MAC introducer Swan type:thermodilution PA Cath depth:50 Procedure performed using ultrasound guided technique. Ultrasound Notes:anatomy identified, needle tip was noted to be adjacent to the nerve/plexus identified, no ultrasound evidence of intravascular and/or intraneural injection and image(s) printed for medical record Attempts: 1 Following insertion, line sutured and dressing applied. Post procedure assessment: blood return through all ports, free fluid flow and no air  Patient tolerated the procedure well with no immediate complications.

## 2016-05-05 NOTE — Anesthesia Procedure Notes (Signed)
Procedure Name: Intubation Date/Time: 05/05/2016 8:50 AM Performed by: Garrison Columbus T Pre-anesthesia Checklist: Patient identified, Emergency Drugs available, Suction available and Patient being monitored Patient Re-evaluated:Patient Re-evaluated prior to inductionOxygen Delivery Method: Circle System Utilized Preoxygenation: Pre-oxygenation with 100% oxygen Intubation Type: IV induction Ventilation: Mask ventilation without difficulty, Oral airway inserted - appropriate to patient size and Two handed mask ventilation required Laryngoscope Size: Glidescope and 4 Grade View: Grade II Tube type: Oral Tube size: 8.0 mm Number of attempts: 1 Airway Equipment and Method: Stylet and Oral airway Placement Confirmation: ETT inserted through vocal cords under direct vision,  positive ETCO2 and breath sounds checked- equal and bilateral Secured at: 23 cm Tube secured with: Tape Dental Injury: Teeth and Oropharynx as per pre-operative assessment

## 2016-05-05 NOTE — Op Note (Signed)
CARDIOVASCULAR SURGERY OPERATIVE NOTE  05/05/2016  Surgeon:  Gaye Pollack, MD  First Assistant: Ellwood Handler, PA-C   Preoperative Diagnosis:  Severe left main and multi-vessel coronary artery disease   Postoperative Diagnosis:  Same   Procedure:  1. Median Sternotomy 2. Extracorporeal circulation 3.   Coronary artery bypass grafting x 4   Left internal mammary graft to the LAD  SVG to PL (RCA)  Sequential SVG to OM1 and OM3  4.   Endoscopic vein harvest from the right leg   Anesthesia:  General Endotracheal   Clinical History/Surgical Indication:  The patient is a 61 year old gentleman with a history of asthma, remote smoking and a family history of premature coronary artery disease ( father had MI age 21) who had an episode of substernal chest pain last Thursday 3/8 while ambulating. It resolved with rest. He had another episode on Friday that was similar [place]. He says he did not have pain again until he was driving home Sunday and stopped in a store. He had severe pain associated with nausea and diaphoresis and had to sit down for about 15 minutes before it resolved. He drove home and had another episode at home that was severe and associated with shortness of breath and diaphoresis. Yesterday he had some just walking across the room and went to [facility]. His initial ECG was felt to be suspicious for STEMI and he was seen by Dr. Fletcher Anon who ruled out STEMI and patient was admitted for unstable angina with troponin 0.04. He was heparinized and taken to cath lab this am which shows 95% distal LM stenosis with heavy calcification and diffusely calcified ostial and proximal LAD stenosis of 40, 50% mid LAD stenosis. The LCX is probably underfilled with three small caliber branches with 60% ostial and 70% mid LCX stenosis.  The PDA has 70% stenosis. LVEF normal and LVEDP 11-12. He was transferred to [facility] for CABG.  This gentleman has a high grade distal LM stenosis and severe 3-vessel coronary disease presenting with unstable angina. His LVEF is normal. I agree that CABG is the best treatment for this patient. I discussed the operative procedure with the patient  including alternatives, benefits and risks; including but not limited to bleeding, blood transfusion, infection, stroke, myocardial infarction, graft failure, heart block requiring a permanent pacemaker, organ dysfunction, and death.  Rosaland Lao understands and agrees to proceed.  Preparation:  The patient was seen in the preoperative holding area and the correct patient, correct operation were confirmed with the patient after reviewing the medical record and catheterization. The consent was signed by me. Preoperative antibiotics were given. A pulmonary arterial line and radial arterial line were placed by the anesthesia team. The patient was taken back to the operating room and positioned supine on the operating room table. After being placed under general endotracheal anesthesia by the anesthesia team a foley catheter was placed. The neck, chest, abdomen, and both legs were prepped with betadine soap and solution and draped in the usual sterile manner. A surgical time-out was taken and the correct patient and operative procedure were confirmed with the nursing and anesthesia staff.   Cardiopulmonary Bypass:  A median sternotomy was performed. The pericardium was opened in the midline. Right ventricular function appeared normal. The ascending aorta was of normal size and had no palpable plaque. There were no contraindications to aortic cannulation or cross-clamping. The patient was  fully systemically heparinized and the ACT was maintained > 400 sec. The proximal aortic arch was cannulated with a 20 F aortic cannula for arterial inflow. Venous cannulation was  performed via the right atrial appendage using a two-staged venous cannula. An antegrade cardioplegia/vent cannula was inserted into the mid-ascending aorta. Aortic occlusion was performed with a single cross-clamp. Systemic cooling to 32 degrees Centigrade and topical cooling of the heart with iced saline were used. Hyperkalemic antegrade cold blood cardioplegia was used to induce diastolic arrest and was then given at about 20 minute intervals throughout the period of arrest to maintain myocardial temperature at or below 10 degrees centigrade. A temperature probe was inserted into the interventricular septum and an insulating pad was placed in the pericardium.   Left internal mammary harvest:  The left side of the sternum was retracted using the Rultract retractor. The left internal mammary artery was harvested as a pedicle graft. All side branches were clipped. It was a medium-sized vessel of good quality with excellent blood flow. It was ligated distally and divided. It was sprayed with topical papaverine solution to prevent vasospasm.   Endoscopic vein harvest:  The right greater saphenous vein was harvested endoscopically through a 2 cm incision medial to the right knee. It was harvested from the upper thigh to below the knee. It was a medium-sized vein of good quality. The side branches were all ligated with 4-0 silk ties.    Coronary arteries:  The coronary arteries were examined.   LAD:  Medium caliber vessel with no distl disease  LCX:  OM1 and OM3 both medium caliber vessels with no distal disease  RCA:  Small PDA branch, medium caliber PL branch with no distal disease.   Grafts:  1. LIMA to the LAD: 1.75 mm. It was sewn end to side using 8-0 prolene continuous suture. 2. SVG to PL (RCA) :  1.6 mm. It was sewn end to side using 7-0 prolene continuous suture. 3. Sequential SVG to OM1:  1.6 mm. It was sewn sequential side to side using 7-0 prolene continuous  suture. 4. Sequential SVG to OM3:  1.6 mm. It was sewn sequential end to side using 7-0 prolene continuous suture.  The proximal vein graft anastomoses were performed to the mid-ascending aorta using continuous 6-0 prolene suture. Graft markers were placed around the proximal anastomoses.   Completion:  The patient was rewarmed to 37 degrees Centigrade. The clamp was removed from the LIMA pedicle and there was rapid warming of the septum and return of ventricular fibrillation. The crossclamp was removed with a time of 91 minutes. There was spontaneous return of sinus rhythm. The distal and proximal anastomoses were checked for hemostasis. The position of the grafts was satisfactory. Two temporary epicardial pacing wires were placed on the right atrium and two on the right ventricle. The patient was weaned from CPB without difficulty on no inotropes. CPB time was 108 minutes. Cardiac output was 5 LPM. TEE showed normal LV function. Heparin was fully reversed with protamine and the aortic and venous cannulas removed. Hemostasis was achieved. Mediastinal and left pleural drainage tubes were placed. The sternum was closed with double #6 stainless steel wires. The fascia was closed with continuous # 1 vicryl suture. The subcutaneous tissue was closed with 2-0 vicryl continuous suture. The skin was closed with 3-0 vicryl subcuticular suture. All sponge, needle, and instrument counts were reported correct at the end of the case. Dry sterile dressings were placed over the incisions and around  the chest tubes which were connected to pleurevac suction. The patient was then transported to the surgical intensive care unit in critical but stable condition.

## 2016-05-05 NOTE — Progress Notes (Signed)
Patient ID: Alan Fernandez, male   DOB: 1955-03-25, 61 y.o.   MRN: [id number] EVENING ROUNDS NOTE :     [address]       [place] [address]             [phone]                 Day of Surgery Procedure(s) (LRB): CORONARY ARTERY BYPASS GRAFTING (CABG) x four , using left internal mammary artery and right leg greater saphenous vein harvested endoscopically (N/A) TRANSESOPHAGEAL ECHOCARDIOGRAM (TEE) (N/A)  Total Length of Stay:  LOS: 1 day  BP 110/67   Pulse 80   Temp 97.5 F (36.4 C)   Resp 11   Ht 5\' 10"  (1.778 m)   Wt 213 lb (96.6 kg)   SpO2 98%   BMI 30.56 kg/m   .Intake/Output      03/14 0701 - 03/15 0700   P.O.    I.V. (mL/kg) 3676.3 (38.1)   Blood 250   IV Piggyback 715   Total Intake(mL/kg) 4641.3 (48)   Urine (mL/kg/hr) 2305 (1.9)   Blood 500 (0.4)   Chest Tube 170 (0.1)   Total Output 2975   Net +1666.3         . sodium chloride 20 mL/hr at 05/05/16 1800  . [START ON 05/06/2016] sodium chloride    . sodium chloride 20 mL/hr at 05/05/16 1800  . dexmedetomidine Stopped (05/05/16 1650)  . lactated ringers    . lactated ringers 20 mL/hr at 05/05/16 1800  . nitroGLYCERIN Stopped (05/05/16 1622)  . phenylephrine (NEO-SYNEPHRINE) Adult infusion 10 mcg/min (05/05/16 1845)     Lab Results  Component Value Date   WBC 7.1 05/05/2016   HGB 9.2 (L) 05/05/2016   HCT 27.0 (L) 05/05/2016   PLT 98 (L) 05/05/2016   GLUCOSE 126 (H) 05/05/2016   CHOL 158 05/04/2016   TRIG 71 05/04/2016   HDL 36 (L) 05/04/2016   LDLCALC 108 (H) 05/04/2016   ALT 17 05/03/2016   AST 22 05/03/2016   NA 140 05/05/2016   K 3.9 05/05/2016   CL 103 05/05/2016   CREATININE 0.80 05/05/2016   BUN 16 05/05/2016   CO2 22 05/05/2016   INR 1.37 05/05/2016   HGBA1C 5.4 05/04/2016   Stable postop, not bleeding Patient is now extubated neurologically intact  Grace Isaac MD  Beeper [phone] Office [phone] 05/05/2016 7:42 PM

## 2016-05-05 NOTE — Progress Notes (Signed)
ANTICOAGULATION CONSULT NOTE - Follow Up Consult  Pharmacy Consult for heparin Indication: CAD  Labs:  Recent Labs  05/03/16 1743 05/04/16 0023 05/04/16 0502 05/04/16 0733 05/05/16 0112  HGB 14.5  --  13.7  --  14.0  HCT 41.1  --  38.2*  --  39.5  PLT 183  --  171  --  164  APTT 29  --   --   --   --   LABPROT 13.0  --   --   --   --   INR 0.98  --   --   --   --   HEPARINUNFRC  --  0.43  --  0.43 0.39  CREATININE 1.33*  --   --   --  1.24  TROPONINI 0.04*  --  0.03* 0.03*  --     Assessment/Plan:  61yo male therapeutic on heparin after resumed post cath, now awaiting CABG. Will continue gtt at current rate and f/u after OR.   Wynona Neat, PharmD, BCPS  05/05/2016,2:02 AM

## 2016-05-05 NOTE — Brief Op Note (Signed)
05/04/2016 - 05/05/2016  12:33 PM  PATIENT:  Alan Fernandez  61 y.o. male  PRE-OPERATIVE DIAGNOSIS:  CAD  POST-OPERATIVE DIAGNOSIS:  CAD  PROCEDURE:  Procedure(s):  CORONARY ARTERY BYPASS GRAFTING x 4 -LIMA to LAD -SVG to PLVB -SEQ SVG to RAMUS AND OM3  ENDOSCOPIC HARVEST GREATER SAPHENOUS VEIN -Right leg  TRANSESOPHAGEAL ECHOCARDIOGRAM (TEE) (N/A)  SURGEON:  Surgeon(s) and Role:    * Gaye Pollack, MD - Primary  PHYSICIAN ASSISTANT: Geneviene Tesch PA-C  ANESTHESIA:   general  EBL:  Total I/O In: 1000 [I.V.:1000] Out: 730 [Urine:730]  BLOOD ADMINISTERED: CELLSAVER  DRAINS: Left Pleural Chest Tubes, Mediastinal Chest drains   LOCAL MEDICATIONS USED:  NONE  SPECIMEN:  No Specimen  DISPOSITION OF SPECIMEN:  N/A  COUNTS:  YES  TOURNIQUET:  * No tourniquets in log *  DICTATION: .Dragon Dictation  PLAN OF CARE: Admit to inpatient   PATIENT DISPOSITION:  ICU - intubated and hemodynamically stable.   Delay start of Pharmacological VTE agent (>24hrs) due to surgical blood loss or risk of bleeding: yes

## 2016-05-05 NOTE — Transfer of Care (Signed)
Immediate Anesthesia Transfer of Care Note  Patient: Alan Fernandez  Procedure(s) Performed: Procedure(s): CORONARY ARTERY BYPASS GRAFTING (CABG) x four , using left internal mammary artery and right leg greater saphenous vein harvested endoscopically (N/A) TRANSESOPHAGEAL ECHOCARDIOGRAM (TEE) (N/A)  Patient Location: SICU  Anesthesia Type:General  Level of Consciousness: sedated, unresponsive and Patient remains intubated per anesthesia plan  Airway & Oxygen Therapy: Patient remains intubated per anesthesia plan and Patient placed on Ventilator (see vital sign flow sheet for setting)  Post-op Assessment: Report given to RN and Post -op Vital signs reviewed and stable  Post vital signs: Reviewed and stable  Last Vitals:  Vitals:   05/05/16 0500 05/05/16 0544  BP: (!) 164/76   Pulse: 63 68  Resp: 16   Temp: 36.6 C     Last Pain:  Vitals:   05/05/16 0500  TempSrc: Oral         Complications: No apparent anesthesia complications

## 2016-05-05 NOTE — Progress Notes (Signed)
  Echocardiogram Echocardiogram Transesophageal has been performed.  Alan Fernandez 05/05/2016, 9:51 AM

## 2016-05-05 NOTE — OR Nursing (Signed)
13:15pm - 45 minute call to SICU nurse 13:45pm - 20 minute call to SICU nurse

## 2016-05-06 ENCOUNTER — Encounter (HOSPITAL_COMMUNITY): Payer: Self-pay | Admitting: Surgery

## 2016-05-06 ENCOUNTER — Inpatient Hospital Stay (HOSPITAL_COMMUNITY): Payer: 59

## 2016-05-06 DIAGNOSIS — Z951 Presence of aortocoronary bypass graft: Secondary | ICD-10-CM

## 2016-05-06 LAB — POCT I-STAT, CHEM 8
BUN: 13 mg/dL (ref 6–20)
CALCIUM ION: 1.09 mmol/L — AB (ref 1.15–1.40)
Chloride: 96 mmol/L — ABNORMAL LOW (ref 101–111)
Creatinine, Ser: 1.1 mg/dL (ref 0.61–1.24)
Glucose, Bld: 145 mg/dL — ABNORMAL HIGH (ref 65–99)
HEMATOCRIT: 27 % — AB (ref 39.0–52.0)
Hemoglobin: 9.2 g/dL — ABNORMAL LOW (ref 13.0–17.0)
Potassium: 3.7 mmol/L (ref 3.5–5.1)
SODIUM: 135 mmol/L (ref 135–145)
TCO2: 28 mmol/L (ref 0–100)

## 2016-05-06 LAB — BASIC METABOLIC PANEL
ANION GAP: 4 — AB (ref 5–15)
BUN: 14 mg/dL (ref 6–20)
CHLORIDE: 108 mmol/L (ref 101–111)
CO2: 24 mmol/L (ref 22–32)
CREATININE: 1.19 mg/dL (ref 0.61–1.24)
Calcium: 7.7 mg/dL — ABNORMAL LOW (ref 8.9–10.3)
GLUCOSE: 113 mg/dL — AB (ref 65–99)
Potassium: 4.3 mmol/L (ref 3.5–5.1)
SODIUM: 136 mmol/L (ref 135–145)

## 2016-05-06 LAB — GLUCOSE, CAPILLARY
GLUCOSE-CAPILLARY: 99 mg/dL (ref 65–99)
GLUCOSE-CAPILLARY: 164 mg/dL — AB (ref 65–99)
Glucose-Capillary: 134 mg/dL — ABNORMAL HIGH (ref 65–99)

## 2016-05-06 LAB — CBC
HCT: 29.8 % — ABNORMAL LOW (ref 39.0–52.0)
HEMATOCRIT: 32.4 % — AB (ref 39.0–52.0)
HEMOGLOBIN: 10.4 g/dL — AB (ref 13.0–17.0)
Hemoglobin: 11.1 g/dL — ABNORMAL LOW (ref 13.0–17.0)
MCH: 31.2 pg (ref 26.0–34.0)
MCH: 31.5 pg (ref 26.0–34.0)
MCHC: 34.3 g/dL (ref 30.0–36.0)
MCHC: 34.9 g/dL (ref 30.0–36.0)
MCV: 91 fL (ref 78.0–100.0)
MCV: 90.3 fL (ref 78.0–100.0)
PLATELETS: 137 10*3/uL — AB (ref 150–400)
PLATELETS: 94 10*3/uL — AB (ref 150–400)
RBC: 3.56 MIL/uL — ABNORMAL LOW (ref 4.22–5.81)
RBC: 3.3 MIL/uL — AB (ref 4.22–5.81)
RDW: 13.5 % (ref 11.5–15.5)
RDW: 13.5 % (ref 11.5–15.5)
WBC: 8 10*3/uL (ref 4.0–10.5)
WBC: 5.8 10*3/uL (ref 4.0–10.5)

## 2016-05-06 LAB — CREATININE, SERUM
CREATININE: 1.16 mg/dL (ref 0.61–1.24)
GFR calc Af Amer: >60 mL/min (ref >60)

## 2016-05-06 LAB — MAGNESIUM
MAGNESIUM: 2.6 mg/dL — AB (ref 1.7–2.4)
MAGNESIUM: 2.3 mg/dL (ref 1.7–2.4)

## 2016-05-06 MED ORDER — ENOXAPARIN SODIUM 40 MG/0.4ML ~~LOC~~ SOLN
40.0000 mg | Freq: Every day | SUBCUTANEOUS | Status: DC
  Administered 2016-05-06 – 2016-05-09 (×4): 40 mg via SUBCUTANEOUS
  Filled 2016-05-06 (×4): qty 0.4

## 2016-05-06 MED ORDER — FUROSEMIDE 10 MG/ML IJ SOLN
40.0000 mg | Freq: Once | INTRAMUSCULAR | Status: AC
  Administered 2016-05-06: 40 mg via INTRAVENOUS
  Filled 2016-05-06: qty 4

## 2016-05-06 MED ORDER — SODIUM CHLORIDE 0.9 % IV SOLN
30.0000 meq | Freq: Once | INTRAVENOUS | Status: AC
  Administered 2016-05-06: 30 meq via INTRAVENOUS
  Filled 2016-05-06: qty 15

## 2016-05-06 NOTE — Progress Notes (Signed)
CT surgery p.m. Rounds  Sinus rhythm up in chair on room air Slightly dizzy and weak today P.m. labs reviewed and are satisfactory-potassium 3.7 hemoglobin 9.2 platelets 94,000

## 2016-05-06 NOTE — Progress Notes (Signed)
Chart reviewed - he appears to be progressing after CABG. I agree with diuresis. Plan follow-up with Dr. Saunders Revel in [facility] for cardiology after discharge.  Pixie Casino, MD, [facility] Attending Cardiologist [place]

## 2016-05-06 NOTE — Care Management Note (Signed)
Case Management Note  Patient Details  Name: QUILL GRINDER MRN: [id number] Date of Birth: 1955/08/27  Subjective/Objective:   POD 1 CABG x 4, NCM will cont to follow progress for dc needs.                Action/Plan:   Expected Discharge Date:                  Expected Discharge Plan:  Home/Self Care  In-House Referral:     Discharge planning Services  CM Consult  Post Acute Care Choice:    Choice offered to:     DME Arranged:    DME Agency:     HH Arranged:    HH Agency:     Status of Service:  In process, will continue to follow  If discussed at Long Length of Stay Meetings, dates discussed:    Additional Comments:  Zenon Mayo, RN 05/06/2016, 10:13 PM

## 2016-05-06 NOTE — Progress Notes (Signed)
1 Day Post-Op Procedure(s) (LRB): CORONARY ARTERY BYPASS GRAFTING (CABG) x four , using left internal mammary artery and right leg greater saphenous vein harvested endoscopically (N/A) TRANSESOPHAGEAL ECHOCARDIOGRAM (TEE) (N/A) Subjective: Sore and tired  Objective: Vital signs in last 24 hours: Temp:  [96.6 F (35.9 C)-100.2 F (37.9 C)] 99.9 F (37.7 C) (03/15 0700) Pulse Rate:  [70-97] 92 (03/15 0700) Cardiac Rhythm: Normal sinus rhythm;Atrial paced (03/15 0400) Resp:  [10-27] 23 (03/15 0700) BP: (86-120)/(49-81) 111/59 (03/15 0700) SpO2:  [91 %-100 %] 95 % (03/15 0700) Arterial Line BP: (86-178)/(44-83) 111/44 (03/15 0700) FiO2 (%):  [40 %-50 %] 40 % (03/14 1735) Weight:  [101.5 kg (223 lb 12.3 oz)] 101.5 kg (223 lb 12.3 oz) (03/15 0500)  Hemodynamic parameters for last 24 hours: PAP: (22-34)/(11-18) 24/11 CO:  [4.2 L/min-7.1 L/min] 7.1 L/min CI:  [2 L/min/m2-3.3 L/min/m2] 3.3 L/min/m2  Intake/Output from previous day: 03/14 0701 - 03/15 0700 In: 6045.7 [I.V.:4830.7; Blood:250; IV Piggyback:965] Out: 0932 [IZTIW:5809; Blood:500; Chest Tube:520] Intake/Output this shift: No intake/output data recorded.  General appearance: alert and cooperative Neurologic: intact Heart: regular rate and rhythm, S1, S2 normal, no murmur, click, rub or gallop Lungs: wheezes RUL Extremities: edema mild Wound: dressings dry  Lab Results:  Recent Labs  05/05/16 2015 05/05/16 2022 05/06/16 0350  WBC 7.5  --  8.0  HGB 11.6* 10.2* 11.1*  HCT 33.0* 30.0* 32.4*  PLT 134*  --  137*   BMET:  Recent Labs  05/05/16 0112  05/05/16 2022 05/06/16 0350  NA 139  < > 138 136  K 4.3  < > 4.7 4.3  CL 108  < > 106 108  CO2 22  --   --  24  GLUCOSE 172*  < > 156* 113*  BUN 14  < > 14 14  CREATININE 1.24  < > 1.10 1.19  CALCIUM 9.2  --   --  7.7*  < > = values in this interval not displayed.  PT/INR:  Recent Labs  05/05/16 1430  LABPROT 17.0*  INR 1.37   ABG    Component Value  Date/Time   PHART 7.332 (L) 05/05/2016 1906   HCO3 22.5 05/05/2016 1906   TCO2 24 05/05/2016 2022   ACIDBASEDEF 3.0 (H) 05/05/2016 1906   O2SAT 92.0 05/05/2016 1906   CBG (last 3)   Recent Labs  05/05/16 2019 05/05/16 2352 05/06/16 0352  GLUCAP 147* 122* 99   CLINICAL DATA:  Status post CABG on 05 May 2016  EXAM: PORTABLE CHEST 1 VIEW  COMPARISON:  Portable chest x-ray of 05 May 2016  FINDINGS: The lungs are reasonably well inflated since extubation. There is no pneumothorax. There is basilar density laterally on the left small left pleural effusion. The left upper chest tube tip projects over the posterior aspect of the fifth rib. The lower chest tube projects at the level of the posterior costophrenic angle The cardiac silhouette is enlarged. The pulmonary vascularity is not clearly engorged. The Swan-Ganz catheter tip projects in the proximal right main pulmonary artery. The mediastinal drain projects at approximately the T5 level.  IMPRESSION: Status post median sternotomy presumably for CABG. Interval extubation of the trachea and esophagus. No pulmonary edema. Mild left lower lobe atelectasis and probable small left pleural effusion. No pneumothorax. The support tubes and lines in reasonable position.   Electronically Signed   By: David  Martinique M.D.   On: 05/06/2016 07:15   ECG: sinus rhythm, nonspecific T-wave changes  Assessment/Plan: S/P Procedure(s) (LRB):  CORONARY ARTERY BYPASS GRAFTING (CABG) x four , using left internal mammary artery and right leg greater saphenous vein harvested endoscopically (N/A) TRANSESOPHAGEAL ECHOCARDIOGRAM (TEE) (N/A)   He is hemodynamically stable but still on low dose neo. Wean as tolerated. Hold beta blocker until stable off neo. Mobilize Diuresis later today Glucose under good control. No hx of DM and preop Hgb A1c was 5.4. Will stop CBG's Asthma: he is wheezing some. Albuterol started prn. d/c  tubes/lines Continue foley due to diuresing patient and patient in ICU See progression orders   LOS: 2 days    Gaye Pollack 05/06/2016

## 2016-05-07 ENCOUNTER — Inpatient Hospital Stay (HOSPITAL_COMMUNITY): Payer: 59

## 2016-05-07 LAB — CBC
HEMATOCRIT: 30.9 % — AB (ref 39.0–52.0)
HEMOGLOBIN: 10.9 g/dL — AB (ref 13.0–17.0)
MCH: 31.9 pg (ref 26.0–34.0)
MCHC: 35.3 g/dL (ref 30.0–36.0)
MCV: 90.4 fL (ref 78.0–100.0)
Platelets: 122 10*3/uL — ABNORMAL LOW (ref 150–400)
RBC: 3.42 MIL/uL — AB (ref 4.22–5.81)
RDW: 13.6 % (ref 11.5–15.5)
WBC: 7.4 10*3/uL (ref 4.0–10.5)

## 2016-05-07 LAB — BASIC METABOLIC PANEL
ANION GAP: 8 (ref 5–15)
BUN: 10 mg/dL (ref 6–20)
CHLORIDE: 97 mmol/L — AB (ref 101–111)
CO2: 26 mmol/L (ref 22–32)
Calcium: 8.1 mg/dL — ABNORMAL LOW (ref 8.9–10.3)
Creatinine, Ser: 0.97 mg/dL (ref 0.61–1.24)
GFR calc non Af Amer: >60 mL/min (ref >60)
Glucose, Bld: 152 mg/dL — ABNORMAL HIGH (ref 65–99)
POTASSIUM: 3.9 mmol/L (ref 3.5–5.1)
SODIUM: 131 mmol/L — AB (ref 135–145)

## 2016-05-07 LAB — GLUCOSE, CAPILLARY
Glucose-Capillary: 153 mg/dL — ABNORMAL HIGH (ref 65–99)
Glucose-Capillary: 162 mg/dL — ABNORMAL HIGH (ref 65–99)

## 2016-05-07 MED ORDER — PANTOPRAZOLE SODIUM 40 MG PO TBEC
40.0000 mg | DELAYED_RELEASE_TABLET | Freq: Every day | ORAL | Status: DC
  Administered 2016-05-07 – 2016-05-10 (×4): 40 mg via ORAL
  Filled 2016-05-07 (×4): qty 1

## 2016-05-07 MED ORDER — OXYCODONE HCL 5 MG PO TABS: 5.0000 mg | ORAL_TABLET | ORAL | Status: DC | PRN

## 2016-05-07 MED ORDER — ACETAMINOPHEN 325 MG PO TABS
650.0000 mg | ORAL_TABLET | Freq: Four times a day (QID) | ORAL | Status: DC | PRN
  Administered 2016-05-07 – 2016-05-08 (×2): 650 mg via ORAL
  Filled 2016-05-07 (×2): qty 2

## 2016-05-07 MED ORDER — SODIUM CHLORIDE 0.9% FLUSH
3.0000 mL | Freq: Two times a day (BID) | INTRAVENOUS | Status: DC
  Administered 2016-05-07 – 2016-05-09 (×5): 3 mL via INTRAVENOUS

## 2016-05-07 MED ORDER — SODIUM CHLORIDE 0.9% FLUSH: 3.0000 mL | INTRAVENOUS | Status: DC | PRN

## 2016-05-07 MED ORDER — BISACODYL 10 MG RE SUPP: 10.0000 mg | Freq: Every day | RECTAL | Status: DC | PRN

## 2016-05-07 MED ORDER — POTASSIUM CHLORIDE CRYS ER 20 MEQ PO TBCR
40.0000 meq | EXTENDED_RELEASE_TABLET | Freq: Once | ORAL | Status: AC
  Administered 2016-05-07: 40 meq via ORAL
  Filled 2016-05-07: qty 2

## 2016-05-07 MED ORDER — DOCUSATE SODIUM 100 MG PO CAPS
200.0000 mg | ORAL_CAPSULE | Freq: Every day | ORAL | Status: DC
  Administered 2016-05-07 – 2016-05-09 (×3): 200 mg via ORAL
  Filled 2016-05-07 (×4): qty 2

## 2016-05-07 MED ORDER — ONDANSETRON HCL 4 MG/2ML IJ SOLN: 4.0000 mg | Freq: Four times a day (QID) | INTRAMUSCULAR | Status: DC | PRN

## 2016-05-07 MED ORDER — ASPIRIN EC 325 MG PO TBEC
325.0000 mg | DELAYED_RELEASE_TABLET | Freq: Every day | ORAL | Status: DC
  Administered 2016-05-07 – 2016-05-10 (×4): 325 mg via ORAL
  Filled 2016-05-07 (×4): qty 1

## 2016-05-07 MED ORDER — SODIUM CHLORIDE 0.9 % IV SOLN: 250.0000 mL | INTRAVENOUS | Status: DC | PRN

## 2016-05-07 MED ORDER — ONDANSETRON HCL 4 MG PO TABS: 4.0000 mg | ORAL_TABLET | Freq: Four times a day (QID) | ORAL | Status: DC | PRN

## 2016-05-07 MED ORDER — TRAMADOL HCL 50 MG PO TABS
50.0000 mg | ORAL_TABLET | ORAL | Status: DC | PRN
  Administered 2016-05-09: 100 mg via ORAL
  Filled 2016-05-07: qty 2

## 2016-05-07 MED ORDER — MOVING RIGHT ALONG BOOK
Freq: Once | Status: AC
  Administered 2016-05-07: 10:00:00
  Filled 2016-05-07: qty 1

## 2016-05-07 MED ORDER — POTASSIUM CHLORIDE CRYS ER 20 MEQ PO TBCR
20.0000 meq | EXTENDED_RELEASE_TABLET | Freq: Two times a day (BID) | ORAL | Status: DC
  Administered 2016-05-08 – 2016-05-10 (×5): 20 meq via ORAL
  Filled 2016-05-07 (×5): qty 1

## 2016-05-07 MED ORDER — METOPROLOL TARTRATE 25 MG PO TABS
25.0000 mg | ORAL_TABLET | Freq: Two times a day (BID) | ORAL | Status: DC
  Administered 2016-05-07 – 2016-05-10 (×7): 25 mg via ORAL
  Filled 2016-05-07 (×7): qty 1

## 2016-05-07 MED ORDER — FUROSEMIDE 10 MG/ML IJ SOLN
40.0000 mg | Freq: Once | INTRAMUSCULAR | Status: AC
  Administered 2016-05-07: 40 mg via INTRAVENOUS
  Filled 2016-05-07: qty 4

## 2016-05-07 MED ORDER — FUROSEMIDE 40 MG PO TABS
40.0000 mg | ORAL_TABLET | Freq: Every day | ORAL | Status: AC
  Administered 2016-05-08 – 2016-05-10 (×3): 40 mg via ORAL
  Filled 2016-05-07 (×3): qty 1

## 2016-05-07 MED ORDER — BISACODYL 5 MG PO TBEC
10.0000 mg | DELAYED_RELEASE_TABLET | Freq: Every day | ORAL | Status: DC | PRN
  Administered 2016-05-09: 10 mg via ORAL
  Filled 2016-05-07: qty 2

## 2016-05-07 NOTE — Discharge Summary (Signed)
Physician Discharge Summary  Patient ID: Alan Fernandez MRN: [id number] DOB/AGE: 62-Feb-1957 62 y.o.  Admit date: 05/04/2016 Discharge date: 05/10/2016  Admission Diagnoses:  Patient Active Problem List   Diagnosis Date Noted  . Accelerated hypertension 05/04/2016  . CKD (chronic kidney disease), stage II 05/04/2016  . Hypokalemia 05/04/2016  . Hyperglycemia 05/04/2016  . Non-ST elevation (NSTEMI) myocardial infarction ([place]) 05/04/2016  . CAD in native artery 05/04/2016  . NSTEMI (non-ST elevated myocardial infarction) ([place]) 05/04/2016  . Unstable angina ([place]) 05/03/2016  . Venous insufficiency 08/09/2013   Discharge Diagnoses:   Patient Active Problem List   Diagnosis Date Noted  . S/P CABG x 4 05/05/2016  . Accelerated hypertension 05/04/2016  . CKD (chronic kidney disease), stage II 05/04/2016  . Hypokalemia 05/04/2016  . Hyperglycemia 05/04/2016  . Non-ST elevation (NSTEMI) myocardial infarction ([place]) 05/04/2016  . CAD in native artery 05/04/2016  . NSTEMI (non-ST elevated myocardial infarction) ([place]) 05/04/2016  . Unstable angina ([place]) 05/03/2016  . Venous insufficiency 08/09/2013   Discharged Condition: good  History of Present Illness:  Mr. Alan Fernandez is a 61 yo male with history of asthma, nicotine abuse and family history of CAD.  The patient developed substernal chest pain about a week prior to presentation.  This occurred while the patient was ambulating, and resolved with rest.  He developed another episode the next day which was similar in nature.  [place].  He remained pain free over the weekend, but again developed pain while driving home.  He stopped in a store at which time the pain was associated with nausea and diaphoresis.  He had to rest for about 15 minutes before it resolved.  The patient continued to drive home but developed another episode that was severe and associated with shortness of breath and  diaphoresis.  This prompted the patient to report to the ED at [facility].  EKG was obtained and was suspicious for STEMI.  He was evaluated by Dr. Fletcher Anon who ruled the patient in for STEMI and he was admitted for unstable angina.  His troponin was elevated at 0.04.  He was taken emergently to the cath lab and found to have significant CAD with LM involvement.  He was transferred to [facility] for possible bypass surgery.   Hospital Course:   He was chest pain free on arrival.  He was evaluated by Dr. Cyndia Bent who was in agreement the patient would benefit from surgery.  The risks and benefits of the procedure were explained to the patient and he was agreeable to proceed.  He was taken to the operating room on 05/05/2016.  He underwent CABG x 4 utilizing LIMA to LAD, SVG to PL, and SEQ SVG to OM1 and OM3.  He also underwent Endoscopic harvest of greater saphenous vein from right leg.  He tolerated the procedure without difficulty and was taken to the SICU in stable condition.  He was extubated the evening of surgery.  During his stay in the SICU the patient's chest tubes and arterial lines were removed without difficulty.  He was weaned off Neo-synephrine as tolerated.  He was wheezing and with his history of Asthma was started on Albuterol prn.  He was aggressively diuresed.  He was maintaining NSR and felt medically stable for transfer to the floor in stable condition.  The patient continued to make progress.  He was maintaining NSR and his temporary pacing wires were removed without difficulty.  He  was mildly hypertensive and he was started on low dose Lisinopril.  He continued to ambulate without difficulty.  His pain is well controlled.  He is felt medically stable for discharge home today.    Significant Diagnostic Studies: angiography:   1. Significant multivessel coronary artery disease, including 90% distal LMCA stenosis with heavy calcification, diffusely calcified ostial/proximal LAD disease of up to 40%,  50% mid LAD stenosis, sequential 60% and 70% ostial and mid LCx lesions, 30-40% proximal and mid RCA disease, and 70% stenosis in midportion of small rPDA. 2. Normal left ventricular contraction. 3. Normal left ventricular filling pressure.  Treatments: surgery:   1. Median Sternotomy 2.Extracorporeal circulation 3.   Coronary artery bypass grafting x 4   Left internal mammary graft to the LAD  SVG to PL (RCA)  Sequential SVG to OM1 and OM3  4.   Endoscopic vein harvest from the right leg  Disposition: Home  Discharge Medications:   Allergies as of 05/10/2016      Reactions   Contrast Media [iodinated Diagnostic Agents] Other (See Comments)   Nausea and throat issues   Adhesive [tape] Other (See Comments)   Pulls skin off. Paper tape is OK to use as long as it isn't too tight.      Medication List    STOP taking these medications   heparin 100-0.45 UNIT/ML-% infusion   nitroGLYCERIN 0.4 MG SL tablet Commonly known as:  NITROSTAT     TAKE these medications   acetaminophen 325 MG tablet Commonly known as:  TYLENOL Take 2 tablets (650 mg total) by mouth every 6 (six) hours as needed for mild pain.   aspirin 325 MG EC tablet Take 1 tablet (325 mg total) by mouth daily.   atorvastatin 40 MG tablet Commonly known as:  LIPITOR Take 1 tablet (40 mg total) by mouth daily at 6 PM.   clobetasol 0.05 % external solution Commonly known as:  TEMOVATE Apply 1 application topically as needed. Rash   lisinopril 2.5 MG tablet Commonly known as:  PRINIVIL,ZESTRIL Take 1 tablet (2.5 mg total) by mouth daily.   metoprolol tartrate 25 MG tablet Commonly known as:  LOPRESSOR Take 1 tablet (25 mg total) by mouth 2 (two) times daily.   montelukast 10 MG tablet Commonly known as:  SINGULAIR Take 1 tablet by mouth daily. At bedtime.   traMADol 50 MG tablet Commonly known as:  ULTRAM Take 1-2 tablets (50-100 mg total) by mouth every 4 (four) hours as needed for moderate  pain.   VENTOLIN HFA 108 (90 Base) MCG/ACT inhaler Generic drug:  albuterol Inhale 2 puffs into the lungs every 4 (four) hours as needed.      Follow-up Information    [facility] [place] Follow up on 05/14/2016.   Specialty:  Cardiothoracic Surgery Why:  For suture removal with nurse at 10:30 Contact information: [place], [place] [phone]       [facility] Follow up on 06/07/2016.   Specialty:  Cardiothoracic Surgery Why:  Appointment is at 1:15, please get CXR at 12:45 at [facility] imaging located on first floor of our office building  Contact information: [place], [place], NP Follow up on 05/27/2016.   Specialties:  Nurse Practitioner, Cardiology, Radiology Why:  Appointment is at 1:30 Contact information: [place]  [place] [phone]           Signed: Ellwood Handler 05/10/2016, 7:53 AM

## 2016-05-07 NOTE — Progress Notes (Signed)
2 Days Post-Op Procedure(s) (LRB): CORONARY ARTERY BYPASS GRAFTING (CABG) x four , using left internal mammary artery and right leg greater saphenous vein harvested endoscopically (N/A) TRANSESOPHAGEAL ECHOCARDIOGRAM (TEE) (N/A) Subjective: No complaints. Walked 300 ft this am.  Objective: Vital signs in last 24 hours: Temp:  [97.4 F (36.3 C)-99.7 F (37.6 C)] 98.5 F (36.9 C) (03/16 0400) Pulse Rate:  [76-95] 84 (03/16 0700) Cardiac Rhythm: Normal sinus rhythm (03/16 0000) Resp:  [14-24] 21 (03/16 0700) BP: (90-159)/(44-74) 129/65 (03/16 0700) SpO2:  [89 %-98 %] 96 % (03/16 0700) Arterial Line BP: (92-110)/(41-42) 110/42 (03/15 0830) Weight:  [102.2 kg (225 lb 5 oz)] 102.2 kg (225 lb 5 oz) (03/16 0500)  Hemodynamic parameters for last 24 hours: CO:  [7.5 L/min] 7.5 L/min CI:  [3.5 L/min/m2] 3.5 L/min/m2  Intake/Output from previous day: 03/15 0701 - 03/16 0700 In: 951.1 [I.V.:586.1; IV Piggyback:365] Out: 2355 [Urine:2275; Chest Tube:80] Intake/Output this shift: No intake/output data recorded.  General appearance: alert and cooperative Neurologic: intact Heart: regular rate and rhythm, S1, S2 normal, no murmur, click, rub or gallop Lungs: clear to auscultation bilaterally Extremities: edema mild Wound: dressings dry  Lab Results:  Recent Labs  05/06/16 1650 05/07/16 0400  WBC 5.8 7.4  HGB 10.4* 10.9*  HCT 29.8* 30.9*  PLT 94* 122*   BMET:  Recent Labs  05/06/16 0350 05/06/16 1648 05/06/16 1650 05/07/16 0400  NA 136 135  --  131*  K 4.3 3.7  --  3.9  CL 108 96*  --  97*  CO2 24  --   --  26  GLUCOSE 113* 145*  --  152*  BUN 14 13  --  10  CREATININE 1.19 1.10 1.16 0.97  CALCIUM 7.7*  --   --  8.1*    PT/INR:  Recent Labs  05/05/16 1430  LABPROT 17.0*  INR 1.37   ABG    Component Value Date/Time   PHART 7.332 (L) 05/05/2016 1906   HCO3 22.5 05/05/2016 1906   TCO2 28 05/06/2016 1648   ACIDBASEDEF 3.0 (H) 05/05/2016 1906   O2SAT 92.0  05/05/2016 1906   CBG (last 3)   Recent Labs  05/06/16 2018 05/06/16 2324 05/07/16 0411  GLUCAP 164* 134* 153*   CLINICAL DATA:  Chest pain following coronary bypass grafting  EXAM: PORTABLE CHEST 1 VIEW  COMPARISON:  05/06/2016  FINDINGS: Cardiac shadow is again enlarged. Mediastinal drain and left thoracostomy catheter have been removed. No recurrent pneumothorax is noted. A right jugular sheath is seen. The Swan-Ganz catheter has been removed. Mild increased bibasilar atelectasis is seen. No other focal abnormality is noted.  IMPRESSION: Increase in mild bibasilar atelectasis.   Electronically Signed   By: Inez Catalina M.D.   On: 05/07/2016 07:24  Assessment/Plan: S/P Procedure(s) (LRB): CORONARY ARTERY BYPASS GRAFTING (CABG) x four , using left internal mammary artery and right leg greater saphenous vein harvested endoscopically (N/A) TRANSESOPHAGEAL ECHOCARDIOGRAM (TEE) (N/A)  He remains hemodynamically stable in sinus rhythm. Continue Lopressor. Add ACE I tomorrow if BP remains stable. Mobilize, IS Diuresis: weight is still 10 lbs over preop Plan for transfer to step-down: see transfer orders   LOS: 3 days    Gaye Pollack 05/07/2016

## 2016-05-07 NOTE — Plan of Care (Signed)
Problem: Activity: Goal: Risk for activity intolerance will decrease Outcome: Progressing Pt up oob multiple times.   Problem: Bowel/Gastric: Goal: Gastrointestinal status for postoperative course will improve Outcome: Progressing Passing gas per pt.  Problem: Cardiac: Goal: Hemodynamic stability will improve Outcome: Progressing Within parameters  Problem: Nutritional: Goal: Risk for body nutrition deficit will decrease Outcome: Progressing Tolerating diet. No N/V at this time  Problem: Respiratory: Goal: Levels of oxygenation will improve Outcome: Progressing Sats wnl on room air while awake  Problem: Pain Management: Goal: Pain level will decrease Outcome: Progressing Minimal c/o pain

## 2016-05-07 NOTE — Discharge Instructions (Signed)
Coronary Artery Bypass Grafting, Care After ° °This sheet gives you information about how to care for yourself after your procedure. Your health care provider may also give you more specific instructions. If you have problems or questions, contact your health care provider. °What can I expect after the procedure? °After the procedure, it is common to have: °· Nausea and a lack of appetite. °· Constipation. °· Weakness and fatigue. °· Depression or irritability. °· Pain or discomfort in your incision areas. °Follow these instructions at home: °Medicines  °· Take over-the-counter and prescription medicines only as told by your health care provider. Do not stop taking medicines or start any new medicines without approval from your health care provider. °· If you were prescribed an antibiotic medicine, take it as told by your health care provider. Do not stop taking the antibiotic even if you start to feel better. °· Do not drive or use heavy machinery while taking prescription pain medicine. °Incision care  °· Follow instructions from your health care provider about how to take care of your incisions. Make sure you: °¨ Wash your hands with soap and water before you change your bandage (dressing). If soap and water are not available, use hand sanitizer. °¨ Change your dressing as told by your health care provider. °¨ Leave stitches (sutures), skin glue, or adhesive strips in place. These skin closures may need to stay in place for 2 weeks or longer. If adhesive strip edges start to loosen and curl up, you may trim the loose edges. Do not remove adhesive strips completely unless your health care provider tells you to do that. °· Keep incision areas clean, dry, and protected. °· Check your incision areas every day for signs of infection. Check for: °¨ More redness, swelling, or pain. °¨ More fluid or blood. °¨ Warmth. °¨ Pus or a bad smell. °· If incisions were made in your legs: °¨ Avoid crossing your legs. °¨ Avoid  sitting for long periods of time. Change positions every 30 minutes. °¨ Raise (elevate) your legs when you are sitting. °Bathing  °· Do not take baths, swim, or use a hot tub until your health care provider approves. °· Only take sponge baths. Pat the incisions dry. Do not rub incisions with a washcloth or towel. °· Ask your health care provider when you can shower. °Eating and drinking  °· Eat foods that are high in fiber, such as raw fruits and vegetables, whole grains, beans, and nuts. Meats should be lean cut. Avoid canned, processed, and fried foods. This can help prevent constipation and is a recommended part of a heart-healthy diet. °· Drink enough fluid to keep your urine clear or pale yellow. °· Limit alcohol intake to no more than 1 drink a day for nonpregnant women and 2 drinks a day for men. One drink equals 12 oz of beer, 5 oz of wine, or 1½ oz of hard liquor. °Activity  °· Rest and limit your activity as told by your health care provider. You may be instructed to: °¨ Stop any activity right away if you have chest pain, shortness of breath, irregular heartbeats, or dizziness. Get help right away if you have any of these symptoms. °¨ Move around frequently for short periods or take short walks as directed by your health care provider. Gradually increase your activities. You may need physical therapy or cardiac rehabilitation to help strengthen your muscles and build your endurance. °¨ Avoid lifting, pushing, or pulling anything that is heavier than 10   4.5 kg) for at least 6 weeks or as told by your health care provider.  Do not drive until your health care provider approves.  Ask your health care provider when you may return to work.  Ask your health care provider when you may resume sexual activity. General instructions   Do not use any products that contain nicotine or tobacco, such as cigarettes and e-cigarettes. If you need help quitting, ask your health care provider.  Take 2-3 deep  breaths every few hours during the day, while you recover. This helps expand your lungs and prevent complications like pneumonia after surgery.  If you were given a device called an incentive spirometer, use it several times a day to practice deep breathing. Support your chest with a pillow or your arms when you take deep breaths or cough.  Wear compression stockings as told by your health care provider. These stockings help to prevent blood clots and reduce swelling in your legs.  Weigh yourself every day. This helps identify if your body is holding (retaining) fluid that may make your heart and lungs work harder.  Keep all follow-up visits as told by your health care provider. This is important. Contact a health care provider if:  You have more redness, swelling, or pain around any incision.  You have more fluid or blood coming from any incision.  Any incision feels warm to the touch.  You have pus or a bad smell coming from any incision  You have a fever.  You have swelling in your ankles or legs.  You have pain in your legs.  You gain 2 lb (0.9 kg) or more a day.  You are nauseous or you vomit.  You have diarrhea. Get help right away if:  You have chest pain that spreads to your jaw or arms.  You are short of breath.  You have a fast or irregular heartbeat.  You notice a "clicking" in your breastbone (sternum) when you move.  You have numbness or weakness in your arms or legs.  You feel dizzy or light-headed. Summary  After the procedure, it is common to have pain or discomfort in the incision areas.  Do not take baths, swim, or use a hot tub until your health care provider approves.  Gradually increase your activities. You may need physical therapy or cardiac rehabilitation to help strengthen your muscles and build your endurance.  Weigh yourself every day. This helps identify if your body is holding (retaining) fluid that may make your heart and lungs work  harder. This information is not intended to replace advice given to you by your health care provider. Make sure you discuss any questions you have with your health care provider. Document Released: 08/28/2004 Document Revised: 12/29/2015 Document Reviewed: 12/29/2015 Elsevier Interactive Patient Education  2017 [employer].   Endoscopic Saphenous Vein Harvesting, Care After Refer to this sheet in the next few weeks. These instructions provide you with information about caring for yourself after your procedure. Your health care provider may also give you more specific instructions. Your treatment has been planned according to current medical practices, but problems sometimes occur. Call your health care provider if you have any problems or questions after your procedure. What can I expect after the procedure? After the procedure, it is common to have:  Pain.  Bruising.  Swelling.  Numbness. Follow these instructions at home: Medicine   Take over-the-counter and prescription medicines only as told by your health care provider.  Do not  drive or operate heavy machinery while taking prescription pain medicine. Incision care    Follow instructions from your health care provider about how to take care of the cut made during surgery (incision). Make sure you:  Wash your hands with soap and water before you change your bandage (dressing). If soap and water are not available, use hand sanitizer.  Change your dressing as told by your health care provider.  Leave stitches (sutures), skin glue, or adhesive strips in place. These skin closures may need to be in place for 2 weeks or longer. If adhesive strip edges start to loosen and curl up, you may trim the loose edges. Do not remove adhesive strips completely unless your health care provider tells you to do that.  Check your incision area every day for signs of infection. Check for:  More redness, swelling, or pain.  More fluid or  blood.  Warmth.  Pus or a bad smell. General instructions   Raise (elevate) your legs above the level of your heart while you are sitting or lying down.  Do any exercises your health care providers have given you. These may include deep breathing, coughing, and walking exercises.  Do not shower, take baths, swim, or use a hot tub unless told by your health care provider.  Wear your elastic stocking if told by your health care provider.  Keep all follow-up visits as told by your health care provider. This is important. Contact a health care provider if:  Medicine does not help your pain.  Your pain gets worse.  You have new leg bruises or your leg bruises get bigger.  You have a fever.  Your leg feels numb.  You have more redness, swelling, or pain around your incision.  You have more fluid or blood coming from your incision.  Your incision feels warm to the touch.  You have pus or a bad smell coming from your incision. Get help right away if:  Your pain is severe.  You develop pain, tenderness, warmth, redness, or swelling in any part of your leg.  You have chest pain.  You have trouble breathing. This information is not intended to replace advice given to you by your health care provider. Make sure you discuss any questions you have with your health care provider. Document Released: 10/21/2010 Document Revised: 07/17/2015 Document Reviewed: 12/23/2014 Elsevier Interactive Patient Education  2017 [employer].

## 2016-05-07 NOTE — Progress Notes (Signed)
Patient ambulated from 2S03 to 2W05, patient tolerated walk well, patient up to chair with call bell and phone in reach, VS stable, no questions from patient or receiving RN, called and updated wife on room change, patient due to void by 1730-receiving RN aware.  Rowe Pavy, RN

## 2016-05-08 MED ORDER — DIPHENHYDRAMINE HCL 25 MG PO CAPS
25.0000 mg | ORAL_CAPSULE | Freq: Every evening | ORAL | Status: DC | PRN
  Administered 2016-05-08 – 2016-05-09 (×2): 25 mg via ORAL
  Filled 2016-05-08 (×2): qty 1

## 2016-05-08 MED ORDER — LISINOPRIL 2.5 MG PO TABS
2.5000 mg | ORAL_TABLET | Freq: Every day | ORAL | Status: DC
  Administered 2016-05-08 – 2016-05-10 (×3): 2.5 mg via ORAL
  Filled 2016-05-08 (×3): qty 1

## 2016-05-08 NOTE — Progress Notes (Addendum)
      [address]       [place] [address]             [phone]      3 Days Post-Op Procedure(s) (LRB): CORONARY ARTERY BYPASS GRAFTING (CABG) x four , using left internal mammary artery and right leg greater saphenous vein harvested endoscopically (N/A) TRANSESOPHAGEAL ECHOCARDIOGRAM (TEE) (N/A) Subjective: Emotional this morning and not sure why. He shares he has not slept in a few days.   Objective: Vital signs in last 24 hours: Temp:  [97.9 F (36.6 C)-98.9 F (37.2 C)] 98.5 F (36.9 C) (03/17 0521) Pulse Rate:  [77-97] 77 (03/17 0521) Cardiac Rhythm: Normal sinus rhythm (03/16 1900) Resp:  [16-23] 18 (03/17 0521) BP: (118-137)/(65-71) 121/65 (03/17 0521) SpO2:  [93 %-97 %] 95 % (03/17 0521) Weight:  [99.1 kg (218 lb 8 oz)] 99.1 kg (218 lb 8 oz) (03/17 0521)  Hemodynamic parameters for last 24 hours:    Intake/Output from previous day: 03/16 0701 - 03/17 0700 In: 150 [P.O.:150] Out: 1725 [Urine:1725] Intake/Output this shift: No intake/output data recorded.  General appearance: alert, cooperative and no distress Heart: regular rate and rhythm, S1, S2 normal, no murmur, click, rub or gallop Lungs: diffuse rhonchi Abdomen: soft, non-tender; bowel sounds normal; no masses,  no organomegaly Extremities: 2+ non-pitting edema Wound: clean and dry without drainage  Lab Results:  Recent Labs  05/06/16 1650 05/07/16 0400  WBC 5.8 7.4  HGB 10.4* 10.9*  HCT 29.8* 30.9*  PLT 94* 122*   BMET:  Recent Labs  05/06/16 0350 05/06/16 1648 05/06/16 1650 05/07/16 0400  NA 136 135  --  131*  K 4.3 3.7  --  3.9  CL 108 96*  --  97*  CO2 24  --   --  26  GLUCOSE 113* 145*  --  152*  BUN 14 13  --  10  CREATININE 1.19 1.10 1.16 0.97  CALCIUM 7.7*  --   --  8.1*    PT/INR:  Recent Labs  05/05/16 1430  LABPROT 17.0*  INR 1.37   ABG    Component Value Date/Time   PHART 7.332 (L) 05/05/2016 1906   HCO3 22.5 05/05/2016 1906   TCO2 28  05/06/2016 1648   ACIDBASEDEF 3.0 (H) 05/05/2016 1906   O2SAT 92.0 05/05/2016 1906   CBG (last 3)   Recent Labs  05/06/16 2324 05/07/16 0411 05/07/16 0836  GLUCAP 134* 153* 162*    Assessment/Plan: S/P Procedure(s) (LRB): CORONARY ARTERY BYPASS GRAFTING (CABG) x four , using left internal mammary artery and right leg greater saphenous vein harvested endoscopically (N/A) TRANSESOPHAGEAL ECHOCARDIOGRAM (TEE) (N/A)  1. CV-NSR in the 37s. Blood pressure stable. Will add a low-dose ACEI. Remove EPW.  2. Pulm- Tolerating room air without issue. Encourage incentive spirometry.  3. Renal-last creatinine was 0.97. Electrolytes okay. Making good urine. Weight continues to trend down. 4. H and H stable. Platelets trending up. 5. Endo-blood glucose level well controlled 6. Insomnia-takes an OTC drug at home but cannot remember the name. Will add PRN Benadryl.   Plan: Continue diuretic regimen. Mobilize today. Encouraged incentive spirometry and pulm toilet. Add low-dose ACEI and monitor.      LOS: 4 days    Elgie Collard 05/08/2016 Patient seen and examined, agree with above  Remo Lipps C. Roxan Hockey, MD [facility] [phone]

## 2016-05-08 NOTE — Progress Notes (Signed)
pacing wires puller pre order. Patient tolerate the procedure well. No issues at present. Vitals taken now and every 15 min for 1 hour.  Patient on bed rest until 1245.  Menaal Russum, Mervin Kung RN

## 2016-05-08 NOTE — Progress Notes (Signed)
1513- Patient just returned from ambulating in the hall with family. Pt walked one hall and around the circle without symptoms. Will f/u Monday for ambulation and education. Sol Passer, MS, ACSM CEP

## 2016-05-09 LAB — VAS US DOPPLER PRE CABG
LCCADDIAS: -18 cm/s
LCCAPDIAS: 22 cm/s
LCCAPSYS: 133 cm/s
LEFT ECA DIAS: -22 cm/s
LEFT VERTEBRAL DIAS: 10 cm/s
LICADDIAS: -22 cm/s
LICAPSYS: -78 cm/s
Left CCA dist sys: -85 cm/s
Left ICA dist sys: -96 cm/s
Left ICA prox dias: -27 cm/s
RCCAPSYS: 92 cm/s
RIGHT ECA DIAS: -7 cm/s
RIGHT VERTEBRAL DIAS: 8 cm/s
Right CCA prox dias: 16 cm/s
Right cca dist sys: -109 cm/s

## 2016-05-09 NOTE — Progress Notes (Addendum)
      [address]       [place] [address]             [phone]      4 Days Post-Op Procedure(s) (LRB): CORONARY ARTERY BYPASS GRAFTING (CABG) x four , using left internal mammary artery and right leg greater saphenous vein harvested endoscopically (N/A) TRANSESOPHAGEAL ECHOCARDIOGRAM (TEE) (N/A) Subjective: Slept better last night. Did require a breathing treatment and per patient this was due to post-nasal drip.   Objective: Vital signs in last 24 hours: Temp:  [97.5 F (36.4 C)-99.4 F (37.4 C)] 99.4 F (37.4 C) (03/18 0502) Pulse Rate:  [77-92] 77 (03/18 0502) Cardiac Rhythm: Normal sinus rhythm (03/18 0701) Resp:  [18-20] 18 (03/18 0502) BP: (119-143)/(59-75) 130/61 (03/18 0502) SpO2:  [95 %-98 %] 95 % (03/18 0502) Weight:  [97.5 kg (214 lb 14.4 oz)] 97.5 kg (214 lb 14.4 oz) (03/18 0502)     Intake/Output from previous day: 03/17 0701 - 03/18 0700 In: 120 [P.O.:120] Out: -  Intake/Output this shift: No intake/output data recorded.  General appearance: alert, cooperative and no distress Heart: regular rate and rhythm, S1, S2 normal, no murmur, click, rub or gallop Lungs: clear to auscultation bilaterally Abdomen: soft, non-tender; bowel sounds normal; no masses,  no organomegaly Extremities: extremities normal, atraumatic, no cyanosis or edema Wound: clean and dry  Lab Results:  Recent Labs  05/06/16 1650 05/07/16 0400  WBC 5.8 7.4  HGB 10.4* 10.9*  HCT 29.8* 30.9*  PLT 94* 122*   BMET:  Recent Labs  05/06/16 1648 05/06/16 1650 05/07/16 0400  NA 135  --  131*  K 3.7  --  3.9  CL 96*  --  97*  CO2  --   --  26  GLUCOSE 145*  --  152*  BUN 13  --  10  CREATININE 1.10 1.16 0.97  CALCIUM  --   --  8.1*    PT/INR: No results for input(s): LABPROT, INR in the last 72 hours. ABG    Component Value Date/Time   PHART 7.332 (L) 05/05/2016 1906   HCO3 22.5 05/05/2016 1906   TCO2 28 05/06/2016 1648   ACIDBASEDEF 3.0 (H)  05/05/2016 1906   O2SAT 92.0 05/05/2016 1906   CBG (last 3)   Recent Labs  05/06/16 2324 05/07/16 0411 05/07/16 0836  GLUCAP 134* 153* 162*    Assessment/Plan: S/P Procedure(s) (LRB): CORONARY ARTERY BYPASS GRAFTING (CABG) x four , using left internal mammary artery and right leg greater saphenous vein harvested endoscopically (N/A) TRANSESOPHAGEAL ECHOCARDIOGRAM (TEE) (N/A)  1. CV-NSR in the 90s, episode of atrial flutter rate 140 noted however cannot find the episode on telemetry. Blood pressure stable. Continue ACEI and Lopressor.  2. Pulm- Tolerating room air without issue. Encourage incentive spirometry.  3. Renal-last creatinine was 0.97. Electrolytes okay. Making good urine. Weight continues to trend down and is close to baseline.  4. H and H stable. Platelets trending up. 5. Endo-blood glucose level well controlled 6. Insomnia-PRN Benadryl.   Plan: Monitor rhythm closely. Discharge tomorrow if rhythm remains stable. Continue to ambulate and encouraged incentive spirometry.      LOS: 5 days    Elgie Collard 05/09/2016   Patient seen and examined, agree with above Apparently had brief episode of atrial fib last night- will monitor, continue metoprolol  Remo Lipps C. Roxan Hockey, MD [facility] [phone]

## 2016-05-10 ENCOUNTER — Other Ambulatory Visit: Payer: Self-pay | Admitting: *Deleted

## 2016-05-10 DIAGNOSIS — I10 Essential (primary) hypertension: Secondary | ICD-10-CM

## 2016-05-10 DIAGNOSIS — I2510 Atherosclerotic heart disease of native coronary artery without angina pectoris: Secondary | ICD-10-CM

## 2016-05-10 MED ORDER — METOPROLOL TARTRATE 25 MG PO TABS: 25.0000 mg | ORAL_TABLET | Freq: Two times a day (BID) | ORAL | 0 refills | Status: DC

## 2016-05-10 MED ORDER — LISINOPRIL 2.5 MG PO TABS: 2.5000 mg | ORAL_TABLET | Freq: Every day | ORAL | 3 refills | Status: DC

## 2016-05-10 MED ORDER — ATORVASTATIN CALCIUM 40 MG PO TABS: 40.0000 mg | ORAL_TABLET | Freq: Every day | ORAL | 0 refills | Status: DC

## 2016-05-10 MED ORDER — ASPIRIN 325 MG PO TBEC: 325.0000 mg | DELAYED_RELEASE_TABLET | Freq: Every day | ORAL | 0 refills | Status: DC

## 2016-05-10 MED ORDER — TRAMADOL HCL 50 MG PO TABS: 50.0000 mg | ORAL_TABLET | ORAL | 0 refills | Status: DC | PRN

## 2016-05-10 MED ORDER — METOPROLOL TARTRATE 25 MG PO TABS
25.0000 mg | ORAL_TABLET | Freq: Two times a day (BID) | ORAL | 0 refills | Status: DC
Start: 2016-05-10 — End: 2016-05-10

## 2016-05-10 MED ORDER — ACETAMINOPHEN 325 MG PO TABS: 650.0000 mg | ORAL_TABLET | Freq: Four times a day (QID) | ORAL | Status: DC | PRN

## 2016-05-10 MED FILL — Sodium Bicarbonate IV Soln 8.4%: INTRAVENOUS | Qty: 50 | Status: AC

## 2016-05-10 MED FILL — Electrolyte-R (PH 7.4) Solution: INTRAVENOUS | Qty: 3000 | Status: AC

## 2016-05-10 MED FILL — Lidocaine HCl IV Inj 20 MG/ML: INTRAVENOUS | Qty: 5 | Status: AC

## 2016-05-10 MED FILL — Sodium Chloride IV Soln 0.9%: INTRAVENOUS | Qty: 2000 | Status: AC

## 2016-05-10 MED FILL — Heparin Sodium (Porcine) Inj 1000 Unit/ML: INTRAMUSCULAR | Qty: 30 | Status: AC

## 2016-05-10 MED FILL — Heparin Sodium (Porcine) Inj 1000 Unit/ML: INTRAMUSCULAR | Qty: 20 | Status: AC

## 2016-05-10 MED FILL — Mannitol IV Soln 20%: INTRAVENOUS | Qty: 500 | Status: AC

## 2016-05-10 NOTE — Progress Notes (Signed)
Alan Fernandez to be D/C'd Home per MD order. Discussed with the patient and all questions fully answered.    VVS, Skin clean, dry and intact without evidence of skin break down, no evidence of skin tears noted.  IV catheter discontinued intact. Site without signs and symptoms of complications. Dressing and pressure applied.  An After Visit Summary was printed and given to the patient.  Patient escorted via [place], and D/C home via private auto.  Cyndra Numbers  05/10/2016 10:09 AM

## 2016-05-10 NOTE — Progress Notes (Signed)
      [address]       ,[place] [address]             [phone]      5 Days Post-Op Procedure(s) (LRB): CORONARY ARTERY BYPASS GRAFTING (CABG) x four , using left internal mammary artery and right leg greater saphenous vein harvested endoscopically (N/A) TRANSESOPHAGEAL ECHOCARDIOGRAM (TEE) (N/A)   Subjective:  No specific complaints.  States he feels like he could sleep for another 8 hours.  He is ready to go home today.  + BM  Objective: Vital signs in last 24 hours: Temp:  [98.1 F (36.7 C)-98.2 F (36.8 C)] 98.2 F (36.8 C) (03/19 0503) Pulse Rate:  [77-82] 82 (03/19 0503) Cardiac Rhythm: Normal sinus rhythm (03/19 0710) Resp:  [18] 18 (03/19 0503) BP: (120-129)/(63-66) 120/66 (03/19 0503) SpO2:  [94 %-95 %] 94 % (03/19 0503) Weight:  [212 lb 9.6 oz (96.4 kg)] 212 lb 9.6 oz (96.4 kg) (03/19 0503)  Intake/Output from previous day: 03/18 0701 - 03/19 0700 In: 240 [P.O.:240] Out: -   General appearance: alert, cooperative and no distress Heart: regular rate and rhythm Lungs: clear to auscultation bilaterally Abdomen: soft, non-tender; bowel sounds normal; no masses,  no organomegaly Extremities: edema trace Wound: clean and dry  Lab Results: No results for input(s): WBC, HGB, HCT, PLT in the last 72 hours. BMET: No results for input(s): NA, K, CL, CO2, GLUCOSE, BUN, CREATININE, CALCIUM in the last 72 hours.  PT/INR: No results for input(s): LABPROT, INR in the last 72 hours. ABG    Component Value Date/Time   PHART 7.332 (L) 05/05/2016 1906   HCO3 22.5 05/05/2016 1906   TCO2 28 05/06/2016 1648   ACIDBASEDEF 3.0 (H) 05/05/2016 1906   O2SAT 92.0 05/05/2016 1906   CBG (last 3)   Recent Labs  05/07/16 0836  GLUCAP 162*    Assessment/Plan: S/P Procedure(s) (LRB): CORONARY ARTERY BYPASS GRAFTING (CABG) x four , using left internal mammary artery and right leg greater saphenous vein harvested endoscopically (N/A) TRANSESOPHAGEAL  ECHOCARDIOGRAM (TEE) (N/A)  1. CV- NSR rate and BP controlled- continue Lopressor, Lisinopril 2. Pulm- no acute issues, continue IS 3. Renal- creatinine WNL, weight is below baseline, last dose of Lasix is scheduled for today 4. CBGs controlled, patient not a diabetic, will d/c SSIP and glucose checks 5. Dispo- patient stable, will d/c home today   LOS: 6 days    Alan Fernandez, Alan Fernandez 05/10/2016

## 2016-05-10 NOTE — Care Management Note (Signed)
Case Management Note Previous CM note initiated by Zenon Mayo, RN--05/06/2016, 10:13 PM   Patient Details  Name: Alan Fernandez MRN: [id number] Date of Birth: Mar 25, 1955  Subjective/Objective:   POD 1 CABG x 4, NCM will cont to follow progress for dc needs.                Action/Plan:   Expected Discharge Date:  05/10/16               Expected Discharge Plan:  Home/Self Care  In-House Referral:     Discharge planning Services  CM Consult  Post Acute Care Choice:  NA Choice offered to:  NA  DME Arranged:    DME Agency:     HH Arranged:    HH Agency:     Status of Service:  Completed, signed off  If discussed at [place] of Stay Meetings, dates discussed:    Discharge Disposition: home/self care   Additional Comments:  05/10/16- 1025- Chianne Byrns rN, CM- pt for d/c home today- s/p CABG x4- no CM needs noted for discharge  [name], [name] 05/10/2016, 10:24 AM [phone]

## 2016-05-10 NOTE — Progress Notes (Signed)
CARDIAC REHAB PHASE I   PRE:  Rate/Rhythm: 96 SR  BP:  Sitting: 150/71        SaO2: 95  MODE:  Ambulation: 450 ft   POST:  Rate/Rhythm: 108 ST  BP:  Sitting: 129/68         SaO2: 96 RA  Pt ambulated 450 ft on RA, IV, hand held assist, steady gait, tolerated well. Pt c/o mild DOE, denies any other complaints, declined rest stop. Cardiac surgery discharge education completed. Reviewed IS, sternal precautions, activity progression, exercise, heart healthy diet, daily weights, and phase 2 cardiac rehab. Pt verbalized understanding, receptive to education. Pt agrees to phase 2 cardiac rehab referral, will send to [facility] per pt request. Pt to chair after walk, call bell within reach.  [phone] Alan Sciara, RN, BSN 05/10/2016 9:13 AM

## 2016-05-14 ENCOUNTER — Other Ambulatory Visit: Payer: Self-pay | Admitting: Surgery

## 2016-05-14 ENCOUNTER — Ambulatory Visit (INDEPENDENT_AMBULATORY_CARE_PROVIDER_SITE_OTHER): Payer: Self-pay

## 2016-05-14 ENCOUNTER — Telehealth: Payer: Self-pay

## 2016-05-14 ENCOUNTER — Ambulatory Visit
Admission: RE | Admit: 2016-05-14 | Discharge: 2016-05-14 | Disposition: A | Discharge status: Home / Self Care | Payer: 59 | Source: Ambulatory Visit | Attending: Surgery | Admitting: Surgery

## 2016-05-14 DIAGNOSIS — Z951 Presence of aortocoronary bypass graft: Secondary | ICD-10-CM

## 2016-05-14 DIAGNOSIS — Z4802 Encounter for removal of sutures: Secondary | ICD-10-CM

## 2016-05-14 DIAGNOSIS — J90 Pleural effusion, not elsewhere classified: Secondary | ICD-10-CM

## 2016-05-14 MED ORDER — POTASSIUM CHLORIDE ER 10 MEQ PO TBCR: 40.0000 meq | EXTENDED_RELEASE_TABLET | Freq: Every day | ORAL | 0 refills | Status: DC

## 2016-05-14 MED ORDER — FUROSEMIDE 40 MG PO TABS: 40.0000 mg | ORAL_TABLET | Freq: Every day | ORAL | 0 refills | Status: DC

## 2016-05-14 NOTE — Telephone Encounter (Signed)
RX for Lasix 40 mg and Potassium 40 mg called into pharm/ patient was notify

## 2016-05-27 ENCOUNTER — Ambulatory Visit (INDEPENDENT_AMBULATORY_CARE_PROVIDER_SITE_OTHER): Payer: 59 | Admitting: Nurse Practitioner

## 2016-05-27 ENCOUNTER — Other Ambulatory Visit: Payer: Self-pay

## 2016-05-27 ENCOUNTER — Encounter: Payer: Self-pay | Admitting: Nurse Practitioner

## 2016-05-27 VITALS — BP 100/58 | HR 61 | Ht 70.0 in | Wt 206.5 lb

## 2016-05-27 DIAGNOSIS — I2510 Atherosclerotic heart disease of native coronary artery without angina pectoris: Secondary | ICD-10-CM

## 2016-05-27 DIAGNOSIS — E785 Hyperlipidemia, unspecified: Secondary | ICD-10-CM

## 2016-05-27 DIAGNOSIS — I10 Essential (primary) hypertension: Secondary | ICD-10-CM

## 2016-05-27 MED ORDER — LISINOPRIL 2.5 MG PO TABS: 2.5000 mg | ORAL_TABLET | Freq: Every day | ORAL | 2 refills | Status: DC

## 2016-05-27 MED ORDER — ATORVASTATIN CALCIUM 40 MG PO TABS: 40.0000 mg | ORAL_TABLET | Freq: Every day | ORAL | 2 refills | Status: DC

## 2016-05-27 MED ORDER — METOPROLOL TARTRATE 25 MG PO TABS
25.0000 mg | ORAL_TABLET | Freq: Two times a day (BID) | ORAL | 2 refills | Status: DC
Start: 2016-05-27 — End: 2016-06-03

## 2016-05-27 MED ORDER — METOPROLOL TARTRATE 25 MG PO TABS: 25.0000 mg | ORAL_TABLET | Freq: Two times a day (BID) | ORAL | 2 refills | Status: DC

## 2016-05-27 NOTE — Patient Instructions (Signed)
Medication Instructions:  Your physician recommends that you continue on your current medications as directed. Please refer to the Current Medication list given to you today.   Labwork: Lipid and liver profile in 1 month at the [facility] outpatient lab in the [place] Nothing to eat or drink after midnight the evening before your labs  Testing/Procedures: none  Follow-Up: Your physician recommends that you schedule a follow-up appointment in: two months with Dr. Saunders Revel.    Any Other Special Instructions Will Be Listed Below (If Applicable).     If you need a refill on your cardiac medications before your next appointment, please call your pharmacy.

## 2016-05-27 NOTE — Progress Notes (Signed)
Office Visit    Patient Name: Alan Fernandez Date of Encounter: 05/27/2016  Primary Care Provider:  Albina Billet, MD Primary Cardiologist:  Andree Coss, MD   Chief Complaint    61 y/o ? s/p recent admission for c/p and mild trop elevation, found to have severe LM and 3VD  CABG x 4, who presents for f/u.  Past Medical History    Past Medical History:  Diagnosis Date  . Asthma   . CAD (coronary artery disease)    a. 04/2016 NSTEMI/Cath: LM 20/90, LAD 40p, 75m, LCX 60ost, 68m, RCA 40p, 14m, RPDA 70, EF 55-65%;  b. 04/2016 s/p CABG x 2: LIMA->LAD, VG->RPL, VG->OM1->OM3.  . Diastolic dysfunction    a. 04/2016 Echo: EF 60-65%, grade 1 DD, mildly to mod dil RA.  Marland Kitchen Eczema   . Edema extremities   . GERD (gastroesophageal reflux disease)   . Hemorrhoid   . Pneumonia Feb 2016   Past Surgical History:  Procedure Laterality Date  . COLONOSCOPY  2013  . CORONARY ARTERY BYPASS GRAFT N/A 05/05/2016   Procedure: CORONARY ARTERY BYPASS GRAFTING (CABG) x four , using left internal mammary artery and right leg greater saphenous vein harvested endoscopically;  Surgeon: Gaye Pollack, MD;  Location: [facility] OR;  Service: Open Heart Surgery;  Laterality: N/A;  . disk in neck  2003  . [place]  . LAPAROSCOPY N/A 12/09/2014   Procedure: DIAGNOSTIC LAPAROSCOPY AND LAPAROTOMY;  Surgeon: Christene Lye, MD;  Location: [facility] ORS;  Service: General;  Laterality: N/A;  . LEFT HEART CATH AND CORONARY ANGIOGRAPHY N/A 05/04/2016   Procedure: Left Heart Cath and Coronary Angiography;  Surgeon: Nelva Bush, MD;  Location: [facility];  Service: Cardiovascular;  Laterality: N/A;  . LYMPH NODE BIOPSY  05-08-15   NON-NECROTIZING GRANULOMATOUS LYMPHADENITIS,   . TEE WITHOUT CARDIOVERSION N/A 05/05/2016   Procedure: TRANSESOPHAGEAL ECHOCARDIOGRAM (TEE);  Surgeon: Gaye Pollack, MD;  Location: [facility];  Service: Open Heart Surgery;  Laterality: N/A;  . TONSILLECTOMY  1965     Allergies  Allergies  Allergen Reactions  . Contrast Media [Iodinated Diagnostic Agents] Other (See Comments)    Nausea and throat issues  . Adhesive [Tape] Other (See Comments)    Pulls skin off. Paper tape is OK to use as long as it isn't too tight.    History of Present Illness    61 y/o ? 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  Cath revealed moderate 3VD with severe distal LM dzs.  He was tx to [facility] and subsequently underwent CABG x 4.  Post-op course was uncomplicated and he was subsequently d/c'd.  Since d/c, he has done reasonably well.  He did have a period of dyspnea and was seen in surgery clinic.  CXR showed small left effusion.  He was placed on lasix for 5 days and dyspnea has since resolved.  His surgical sites have been healing well.  He has not been having any significant incisional pain and denies angina, dyspnea, pnd, orthopnea, n, v, dizziness, syncope, or early satiety.  He has had mild right lower extremity/ankle swelling.  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  He is tolerating his medications well.  Home Medications    Prior to Admission medications   Medication Sig  Start Date End Date Taking? Authorizing Provider  acetaminophen (TYLENOL) 325 MG tablet Take 2 tablets (650 mg total) by mouth every 6 (six) hours as needed for mild pain. 05/10/16  Yes Erin R Barrett, PA-C  aspirin EC 325 MG EC tablet Take 1 tablet (325 mg total) by mouth daily. 05/10/16  Yes Erin R Barrett, PA-C  clobetasol (TEMOVATE) 0.05 % external solution Apply 1 application topically as needed. Rash 12/22/12  Yes Historical Provider, MD  lisinopril (PRINIVIL,ZESTRIL) 2.5 MG tablet Take 1 tablet (2.5 mg total) by mouth daily. 05/27/16  Yes Rogelia Mire, NP  montelukast (SINGULAIR) 10 MG tablet Take 1  tablet by mouth daily. At bedtime. 10/23/12  Yes Historical Provider, MD  VENTOLIN HFA 108 (90 BASE) MCG/ACT inhaler Inhale 2 puffs into the lungs every 4 (four) hours as needed.  01/28/13  Yes Historical Provider, MD  atorvastatin (LIPITOR) 40 MG tablet Take 1 tablet (40 mg total) by mouth daily at 6 PM. 05/27/16   Rogelia Mire, NP  metoprolol tartrate (LOPRESSOR) 25 MG tablet Take 1 tablet (25 mg total) by mouth 2 (two) times daily. 05/27/16   Rogelia Mire, NP  traMADol (ULTRAM) 50 MG tablet Take 1-2 tablets (50-100 mg total) by mouth every 4 (four) hours as needed for moderate pain. Patient not taking: Reported on 05/27/2016 05/10/16   Lodema Hong Barrett, PA-C    Review of Systems    Mild right lower ext edema - managed with compression hose.  He denies chest pain, palpitations, dyspnea, pnd, orthopnea, n, v, dizziness, syncope, weight gain, or early satiety.  All other systems reviewed and are otherwise negative except as noted above.  Physical Exam    VS:  BP (!) 100/58 (BP Location: Left Arm, Patient Position: Sitting, Cuff Size: Normal)   Pulse 61   Ht 5\' 10"  (1.778 m)   Wt 206 lb 8 oz (93.7 kg)   BMI 29.63 kg/m  , BMI Body mass index is 29.63 kg/m. GEN: Well nourished, well developed, in no acute distress.  HEENT: normal.  Neck: Supple, no JVD, carotid bruits, or masses. Cardiac: RRR, no murmurs, rubs, or gallops. No clubbing, cyanosis.  Trace RLE edema.  Radials/DP/PT 2+ and equal bilaterally. Sternal incision is healing well w/o erythema or discharge. Respiratory:  Respirations regular and unlabored, clear to auscultation bilaterally. GI: Soft, nontender, nondistended, BS + x 4.  Abd incision sites healing well w/o erythema or discharge. MS: no deformity or atrophy. Skin: warm and dry, no rash. Neuro:  Strength and sensation are intact. Psych: Normal affect.  Accessory Clinical Findings    ECG - RSR, 61, anterior TWI.  Assessment & Plan    1.  CAD s/p CABG:  s/p recent  admission with chest pain and mild trop elevation.  Cath revealed severe LM and otw moderate 3VD.  CABG x 4 performed @ [facility].  Post-op course uncomplicated.  He did have some dyspnea following discharge with small left effusion noted on cxr.  This resolved with a five day course of lasix and he has since done well.  Surgical sites are healing well.  No significant c/p or dyspnea.  He is interested in pursuing cardiac rehab once cleared by surgery.  He remains on asa, statin,  blocker, and acei.  Refill meds today.  He has f/u with [facility] in 2 wks.  F/u Dr. Saunders Revel in 2 mos.  2.  HL:  LDL 85 on 3/12.  F/u lipids/lft's in 4 wks.  Cont  statin therapy with goal of 70.  3. Dispo:  f/u Dr. Saunders Revel in 2 mos or sooner if necessary.  Murray Hodgkins, NP 05/27/2016, 3:15 PM

## 2016-06-03 ENCOUNTER — Telehealth: Payer: Self-pay | Admitting: Nurse Practitioner

## 2016-06-03 DIAGNOSIS — I10 Essential (primary) hypertension: Secondary | ICD-10-CM

## 2016-06-03 MED ORDER — LISINOPRIL 2.5 MG PO TABS: 2.5000 mg | ORAL_TABLET | Freq: Every day | ORAL | 0 refills | Status: DC

## 2016-06-03 MED ORDER — METOPROLOL TARTRATE 25 MG PO TABS: 25.0000 mg | ORAL_TABLET | Freq: Two times a day (BID) | ORAL | 0 refills | Status: DC

## 2016-06-03 NOTE — Telephone Encounter (Signed)
°*  STAT* If patient is at the pharmacy, call can be transferred to refill team.   1. Which medications need to be refilled? (please list name of each medication and dose if known)  Metoprolol Lisinopril   2. Which pharmacy/location (including street and city if local pharmacy) is medication to be sent to? [facility] [place]  3. Do they need a 30 day or 90 day supply? 90 day

## 2016-06-04 ENCOUNTER — Other Ambulatory Visit: Payer: Self-pay | Admitting: Surgery

## 2016-06-04 DIAGNOSIS — Z951 Presence of aortocoronary bypass graft: Secondary | ICD-10-CM

## 2016-06-07 ENCOUNTER — Ambulatory Visit (INDEPENDENT_AMBULATORY_CARE_PROVIDER_SITE_OTHER): Payer: Self-pay | Admitting: Surgical

## 2016-06-07 ENCOUNTER — Ambulatory Visit
Admission: RE | Admit: 2016-06-07 | Discharge: 2016-06-07 | Disposition: A | Discharge status: Home / Self Care | Payer: 59 | Source: Ambulatory Visit | Attending: Surgery | Admitting: Surgery

## 2016-06-07 VITALS — BP 107/66 | HR 64 | Resp 16 | Ht 70.0 in | Wt 199.0 lb

## 2016-06-07 DIAGNOSIS — Z951 Presence of aortocoronary bypass graft: Secondary | ICD-10-CM

## 2016-06-07 DIAGNOSIS — I2510 Atherosclerotic heart disease of native coronary artery without angina pectoris: Secondary | ICD-10-CM

## 2016-06-07 NOTE — Patient Instructions (Signed)
Given instructions on activity progression and driving.

## 2016-06-07 NOTE — Progress Notes (Signed)
[address]       [place] [address]             [phone]                  Vishnu T [place] Medical Record #[id number] Date of Birth: 12-18-1955  Referring MD:End, Harrell Gave, MD Primary Cardiology: Primary Samule Dry, MD  Chief Complaint:  Follow Up Visit   05/05/2016  Surgeon:  Gaye Pollack, MD  First Assistant: Ellwood Handler, PA-C   Preoperative Diagnosis:  Severe left main and multi-vessel coronary artery disease   Postoperative Diagnosis:  Same   Procedure:  1. Median Sternotomy 2. Extracorporeal circulation 3.   Coronary artery bypass grafting x 4   Left internal mammary graft to the LAD  SVG to PL (RCA)  Sequential SVG to OM1 and OM3  4.   Endoscopic vein harvest from the right leg   Anesthesia:  General Endotracheal  History of Present Illness:    The patient is a 61 year old male seen in the office on today's date in routine follow-up following the above described procedure. He has no current complaints related to the surgery. He specifically denies chest pain, shortness of breath, fevers, chills or other constitutional symptoms. He does note a little bit of lower extremity swelling persists but is improving. He is anxious to get back to work as well as begin driving.    Zubrod Score: At the time of surgery this patient's most appropriate activity status/level should be described as: []     0    Normal activity, no symptoms []     1    Restricted in physical strenuous activity but ambulatory, able to do out light work []     2    Ambulatory and capable of self care, unable to do work activities, up and about                 >50 % of waking hours                                                                                   []     3    Only limited self care, in bed greater than 50% of waking hours []     4    Completely disabled, no self care, confined to bed or chair []     5     Moribund  History  Smoking Status  . Former Smoker  . Quit date: 12/05/2004  Smokeless Tobacco  . Never Used       Allergies  Allergen Reactions  . Contrast Media [Iodinated Diagnostic Agents] Other (See Comments)    Nausea and throat issues  . Adhesive [Tape] Other (See Comments)    Pulls skin off. Paper tape is OK to use as long as it isn't too tight.    Current Outpatient Prescriptions  Medication Sig Dispense Refill  . aspirin EC 325 MG EC tablet Take 1 tablet (325 mg total) by mouth daily. 30 tablet 0  . atorvastatin (LIPITOR) 40 MG tablet Take 1 tablet (40 mg total) by mouth daily at 6 PM. 90 tablet  2  . clobetasol (TEMOVATE) 0.05 % external solution Apply 1 application topically as needed. Rash    . lisinopril (PRINIVIL,ZESTRIL) 2.5 MG tablet Take 1 tablet (2.5 mg total) by mouth daily. 90 tablet 0  . metoprolol tartrate (LOPRESSOR) 25 MG tablet Take 1 tablet (25 mg total) by mouth 2 (two) times daily. 180 tablet 0  . montelukast (SINGULAIR) 10 MG tablet Take 1 tablet by mouth daily. At bedtime.    . VENTOLIN HFA 108 (90 BASE) MCG/ACT inhaler Inhale 2 puffs into the lungs every 4 (four) hours as needed.      No current facility-administered medications for this visit.        Physical Exam: BP 107/66 (BP Location: Right Arm, Patient Position: Sitting, Cuff Size: Large)   Pulse 64   Resp 16   Ht 5\' 10"  (1.778 m)   Wt 199 lb (90.3 kg)   SpO2 96% Comment: ON RA  BMI 28.55 kg/m   General appearance: alert, cooperative and no distress Heart: regular rate and rhythm Lungs: clear to auscultation bilaterally Abdomen: soft, non-tender; bowel sounds normal; no masses,  no organomegaly Extremities: + Right greater than left lower extremity edema Wounds: Incisions healing well without evidence of infection.  Diagnostic Studies & Laboratory data:         Recent Radiology Findings: Dg Chest 2 View  Result Date: 06/07/2016 CLINICAL DATA:  Nonproductive cough. CABG 32  days ago. History of asthma. EXAM: CHEST  2 VIEW COMPARISON:  Chest x-ray of May 14, 2016 FINDINGS: The right lung is well-expanded and clear. On the left a tiny pleural effusion remains. The lung is otherwise clear. The heart is top-normal in size. The pulmonary vascularity is normal. The sternal wires are intact. The mediastinum is normal in width. The bony thorax is unremarkable. IMPRESSION: And further interval decrease in the volume of pleural fluid on the left. No CHF, pneumonia, nor other acute cardiopulmonary abnormality. Electronically Signed   By: David  Martinique M.D.   On: 06/07/2016 13:08      I have independently reviewed the above radiology findings and reviewed findings  with the patient.  Recent Labs: Lab Results  Component Value Date   WBC 7.4 05/07/2016   HGB 10.9 (L) 05/07/2016   HCT 30.9 (L) 05/07/2016   PLT 122 (L) 05/07/2016   GLUCOSE 152 (H) 05/07/2016   CHOL 158 05/04/2016   TRIG 71 05/04/2016   HDL 36 (L) 05/04/2016   LDLCALC 108 (H) 05/04/2016   ALT 17 05/03/2016   AST 22 05/03/2016   NA 131 (L) 05/07/2016   K 3.9 05/07/2016   CL 97 (L) 05/07/2016   CREATININE 0.97 05/07/2016   BUN 10 05/07/2016   CO2 26 05/07/2016   INR 1.37 05/05/2016   HGBA1C 5.4 05/05/2016      Assessment / Plan:  Patient is doing quite well. There are no surgically related issues currently. He is anxious to get back to work and as he has a low physical activity job, primarily [employer] I told him it was okay to return to work. We discussed activity progression and swell as driving protocols. We will see again on a when necessary basis for any surgically related issues or at request.         Rafaelita Foister E 06/07/2016 1:35 PM

## 2016-06-17 ENCOUNTER — Encounter: Payer: Self-pay | Admitting: General Surgery

## 2016-06-17 ENCOUNTER — Ambulatory Visit (INDEPENDENT_AMBULATORY_CARE_PROVIDER_SITE_OTHER): Payer: 59 | Admitting: General Surgery

## 2016-06-17 VITALS — BP 122/60 | HR 66 | Resp 14 | Ht 70.0 in | Wt 208.0 lb

## 2016-06-17 DIAGNOSIS — D490 Neoplasm of unspecified behavior of digestive system: Secondary | ICD-10-CM

## 2016-06-17 DIAGNOSIS — K6389 Other specified diseases of intestine: Secondary | ICD-10-CM

## 2016-06-17 NOTE — Progress Notes (Signed)
Patient ID: Alan Fernandez, male   DOB: Mar 26, 1955, 61 y.o.   MRN: [id number]  Chief Complaint  Patient presents with  . Follow-up    HPI Alan Fernandez is a 61 y.o. male.  Here today for follow up mesenteric mass. He denies any gastrointestinal issues. 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  He had chest pain 5-6 weeks ago and subsequent finding of NSTEMI He is recovering from cardiac bypass 05-05-16.  HPI  Past Medical History:  Diagnosis Date  . Asthma   . CAD (coronary artery disease)    a. 04/2016 NSTEMI/Cath: LM 20/90, LAD 40p, 66m, LCX 60ost, 11m, RCA 40p, 12m, RPDA 70, EF 55-65%;  b. 04/2016 s/p CABG x 2: LIMA->LAD, VG->RPL, VG->OM1->OM3.  . Diastolic dysfunction    a. 04/2016 Echo: EF 60-65%, grade 1 DD, mildly to mod dil RA.  Marland Kitchen Eczema   . Edema extremities   . GERD (gastroesophageal reflux disease)   . Hemorrhoid   . Pneumonia Feb 2016    Past Surgical History:  Procedure Laterality Date  . COLONOSCOPY  2013  . CORONARY ARTERY BYPASS GRAFT N/A 05/05/2016   Procedure: CORONARY ARTERY BYPASS GRAFTING (CABG) x four , using left internal mammary artery and right leg greater saphenous vein harvested endoscopically;  Surgeon: Gaye Pollack, MD;  Location: [facility];  Service: Open Heart Surgery;  Laterality: N/A;  . disk in neck  2003  . [place]  . LAPAROSCOPY N/A 12/09/2014   Procedure: DIAGNOSTIC LAPAROSCOPY AND LAPAROTOMY;  Surgeon: Christene Lye, MD;  Location: [facility];  Service: General;  Laterality: N/A;  . LEFT HEART CATH AND CORONARY ANGIOGRAPHY N/A 05/04/2016   Procedure: Left Heart Cath and Coronary Angiography;  Surgeon: Nelva Bush, MD;  Location: [facility];  Service: Cardiovascular;  Laterality: N/A;  . LYMPH NODE BIOPSY  05-08-15   NON-NECROTIZING GRANULOMATOUS LYMPHADENITIS,   . TEE WITHOUT CARDIOVERSION N/A 05/05/2016   Procedure: TRANSESOPHAGEAL  ECHOCARDIOGRAM (TEE);  Surgeon: Gaye Pollack, MD;  Location: [facility];  Service: Open Heart Surgery;  Laterality: N/A;  . TONSILLECTOMY  1965    Family History  Problem Relation Age of Onset  . Hypertension Mother   . CAD Father     a. father with MI at 98     Social History Social History  Substance Use Topics  . Smoking status: Former Smoker    Quit date: 12/05/2004  . Smokeless tobacco: Never Used  . Alcohol use 4.8 oz/week    8 Cans of beer per week     Comment: 4-5 beers per week    Allergies  Allergen Reactions  . Contrast Media [Iodinated Diagnostic Agents] Other (See Comments)    Nausea and throat issues  . Adhesive [Tape] Other (See Comments)    Pulls skin off. Paper tape is OK to use as long as it isn't too tight.    Current Outpatient Prescriptions  Medication Sig Dispense Refill  . aspirin EC 325 MG EC tablet Take 1 tablet (325 mg total) by mouth daily. 30 tablet 0  . atorvastatin (LIPITOR) 40 MG tablet Take 1 tablet (40 mg total) by mouth daily at 6 PM. 90 tablet 2  . clobetasol (TEMOVATE) 0.05 % external solution Apply 1 application topically as needed. Rash    . lisinopril (PRINIVIL,ZESTRIL) 2.5 MG tablet Take 1 tablet (2.5 mg total) by mouth daily. 90 tablet 0  . metoprolol  tartrate (LOPRESSOR) 25 MG tablet Take 1 tablet (25 mg total) by mouth 2 (two) times daily. 180 tablet 0  . montelukast (SINGULAIR) 10 MG tablet Take 1 tablet by mouth daily. At bedtime.    . VENTOLIN HFA 108 (90 BASE) MCG/ACT inhaler Inhale 2 puffs into the lungs every 4 (four) hours as needed.      No current facility-administered medications for this visit.     Review of Systems Review of Systems  Constitutional: Negative.   Respiratory: Positive for cough.   Cardiovascular: Negative.     Blood pressure 122/60, pulse 66, resp. rate 14, height 5\' 10"  (1.778 m), weight 208 lb (94.3 kg), SpO2 97 %.  Physical Exam Physical Exam  Constitutional: He is oriented to person, place,  and time. He appears well-developed and well-nourished.  HENT:  Mouth/Throat: No oropharyngeal exudate.  Eyes: Conjunctivae are normal. Pupils are equal, round, and reactive to light. No scleral icterus.  Cardiovascular: Normal rate, regular rhythm and normal heart sounds.   Pulmonary/Chest: Effort normal and breath sounds normal.    Abdominal: Soft. Bowel sounds are normal.  Lymphadenopathy:    He has no cervical adenopathy.    He has no axillary adenopathy.       Right: No inguinal adenopathy present.       Left: No inguinal adenopathy present.  Neurological: He is alert and oriented to person, place, and time.  Skin: Skin is warm and dry.  Psychiatric: His behavior is normal.    Data Reviewed Prior notes reviewed Post op CBC from 05/07/16 reviewed - Hgb 10.9  Assessment    Stable exam. 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 Clinically stable. Recent cardiac event leading to 4 vessel bypass, doing well subsequently.     Plan    Patient to return in 6 months with a follow up CT abdomen/pelvis, with contrast.       HPI, Physical Exam, Assessment and Plan have been scribed under the direction and in the presence of Mckinley Jewel, MD  Karie Fetch, RN  I have completed the exam and reviewed the above documentation for accuracy and completeness.  I agree with the above.  Haematologist has been used and any errors in dictation or transcription are unintentional.  Tanvir Hipple G. Jamal Collin, M.D., F.A.C.S.  Junie Panning G 06/17/2016, 4:30 PM

## 2016-06-17 NOTE — Patient Instructions (Addendum)
Patient to return in 6 months with a follow up CT abdomen/pelvis, with contrast.

## 2016-06-28 ENCOUNTER — Encounter: Payer: Self-pay | Admitting: *Deleted

## 2016-06-28 ENCOUNTER — Encounter: Payer: 59 | Attending: Internal Medicine | Admitting: *Deleted

## 2016-06-28 VITALS — Ht 70.0 in | Wt 203.9 lb

## 2016-06-28 DIAGNOSIS — Z951 Presence of aortocoronary bypass graft: Secondary | ICD-10-CM | POA: Diagnosis present

## 2016-06-28 DIAGNOSIS — I214 Non-ST elevation (NSTEMI) myocardial infarction: Secondary | ICD-10-CM | POA: Diagnosis not present

## 2016-06-28 NOTE — Patient Instructions (Signed)
Patient Instructions  Patient Details  Name: Alan Fernandez MRN: [id number] Date of Birth: [birth year] Referring Provider:  Nelva Bush, MD  Below are the personal goals you chose as well as exercise and nutrition goals. Our goal is to help you keep on track towards obtaining and maintaining your goals. We will be discussing your progress on these goals with you throughout the program.  Initial Exercise Prescription:     Initial Exercise Prescription - 06/28/16 1500      Date of Initial Exercise RX and Referring Provider   Date 06/28/16   Referring Provider End, Harrell Gave MD     Treadmill   MPH 2.5   Grade 0.5   Minutes 15   METs 3.09     NuStep   Level 3   SPM 80   Minutes 15   METs 2     Recumbant Elliptical   Level 2   RPM 50   Minutes 15   METs 2     Prescription Details   Frequency (times per week) 3   Duration Progress to 45 minutes of aerobic exercise without signs/symptoms of physical distress     Intensity   THRR 40-80% of Max Heartrate 98-139   Ratings of Perceived Exertion 11-13   Perceived Dyspnea 0-4     Progression   Progression Continue to progress workloads to maintain intensity without signs/symptoms of physical distress.     Resistance Training   Training Prescription Yes   Weight 4 lbs   Reps 10-15      Exercise Goals: Frequency: Be able to perform aerobic exercise three times per week working toward 3-5 days per week.  Intensity: Work with a perceived exertion of 11 (fairly light) - 15 (hard) as tolerated. Follow your new exercise prescription and watch for changes in prescription as you progress with the program. Changes will be reviewed with you when they are made.  Duration: You should be able to do 30 minutes of continuous aerobic exercise in addition to a 5 minute warm-up and a 5 minute cool-down routine.  Nutrition Goals: Your personal nutrition goals will be established when you do your nutrition analysis with the  dietician.  The following are nutrition guidelines to follow: Cholesterol < 200mg /day Sodium < 1500mg /day Fiber: Men over 50 yrs - 30 grams per day  Personal Goals:     Personal Goals and Risk Factors at Admission - 06/28/16 1322      Core Components/Risk Factors/Patient Goals on Admission    Weight Management Yes;Obesity;Weight Loss   Intervention Weight Management: Develop a combined nutrition and exercise program designed to reach desired caloric intake, while maintaining appropriate intake of nutrient and fiber, sodium and fats, and appropriate energy expenditure required for the weight goal.;Weight Management: Provide education and appropriate resources to help participant work on and attain dietary goals.;Weight Management/Obesity: Establish reasonable short term and long term weight goals.;Obesity: Provide education and appropriate resources to help participant work on and attain dietary goals.  Has lost 20 pounds since surgery   Admit Weight 204 lb 14.4 oz (92.9 kg)   Goal Weight: Short Term 200 lb (90.7 kg)   Goal Weight: Long Term 170 lb (77.1 kg)   Expected Outcomes Short Term: Continue to assess and modify interventions until short term weight is achieved;Long Term: Adherence to nutrition and physical activity/exercise program aimed toward attainment of established weight goal;Weight Loss: Understanding of general recommendations for a balanced deficit meal plan, which promotes 1-2 lb weight loss per week  and includes a negative energy balance of [phone] kcal/d;Understanding recommendations for meals to include 15-35% energy as protein, 25-35% energy from fat, 35-60% energy from carbohydrates, less than 200mg  of dietary cholesterol, 20-35 gm of total fiber daily;Understanding of distribution of calorie intake throughout the day with the consumption of 4-5 meals/snacks   Hypertension Yes   Intervention Provide education on lifestyle modifcations including regular physical  activity/exercise, weight management, moderate sodium restriction and increased consumption of fresh fruit, vegetables, and low fat dairy, alcohol moderation, and smoking cessation.;Monitor prescription use compliance.   Expected Outcomes Short Term: Continued assessment and intervention until BP is < 140/27mm HG in hypertensive participants. < 130/31mm HG in hypertensive participants with diabetes, heart failure or chronic kidney disease.;Long Term: Maintenance of blood pressure at goal levels.   Lipids Yes   Intervention Provide education and support for participant on nutrition & aerobic/resistive exercise along with prescribed medications to achieve LDL 70mg , HDL >40mg .   Expected Outcomes Short Term: Participant states understanding of desired cholesterol values and is compliant with medications prescribed. Participant is following exercise prescription and nutrition guidelines.;Long Term: Cholesterol controlled with medications as prescribed, with individualized exercise RX and with personalized nutrition plan. Value goals: LDL < 70mg , HDL > 40 mg.   Stress Yes   Intervention Offer individual and/or small group education and counseling on adjustment to heart disease, stress management and health-related lifestyle change. Teach and support self-help strategies.;Refer participants experiencing significant psychosocial distress to appropriate mental health specialists for further evaluation and treatment. When possible, include family members and significant others in education/counseling sessions.   Expected Outcomes Short Term: Participant demonstrates changes in health-related behavior, relaxation and other stress management skills, ability to obtain effective social support, and compliance with psychotropic medications if prescribed.;Long Term: Emotional wellbeing is indicated by absence of clinically significant psychosocial distress or social isolation.      Tobacco Use Initial  Evaluation: History  Smoking Status  . Former Smoker  . Packs/day: 2.00  . Years: 34.00  . Quit date: 12/05/2004  Smokeless Tobacco  . Never Used    Comment: Quit in 2007    Copy of goals given to participant.

## 2016-06-28 NOTE — Progress Notes (Signed)
Cardiac Individual Treatment Plan  Patient Details  Name: Alan Fernandez MRN: [id number] Date of Birth: 1955-08-04 Referring Provider:     Cardiac Rehab from 06/28/2016 in [facility] Cardiac and Pulmonary Rehab  Referring Provider  End, Harrell Gave MD      Initial Encounter Date:    Cardiac Rehab from 06/28/2016 in [facility] Cardiac and Pulmonary Rehab  Date  06/28/16  Referring Provider  End, Harrell Gave MD      Visit Diagnosis: S/P CABG x 4  NSTEMI (non-ST elevated myocardial infarction) [facility])  Patient's Home Medications on Admission:  Current Outpatient Prescriptions:  .  aspirin EC 325 MG EC tablet, Take 1 tablet (325 mg total) by mouth daily., Disp: 30 tablet, Rfl: 0 .  atorvastatin (LIPITOR) 40 MG tablet, Take 1 tablet (40 mg total) by mouth daily at 6 PM., Disp: 90 tablet, Rfl: 2 .  clobetasol (TEMOVATE) 0.05 % external solution, Apply 1 application topically as needed. Rash, Disp: , Rfl:  .  lisinopril (PRINIVIL,ZESTRIL) 2.5 MG tablet, Take 1 tablet (2.5 mg total) by mouth daily., Disp: 90 tablet, Rfl: 0 .  metoprolol tartrate (LOPRESSOR) 25 MG tablet, Take 1 tablet (25 mg total) by mouth 2 (two) times daily., Disp: 180 tablet, Rfl: 0 .  montelukast (SINGULAIR) 10 MG tablet, Take 1 tablet by mouth daily. At bedtime., Disp: , Rfl:  .  VENTOLIN HFA 108 (90 BASE) MCG/ACT inhaler, Inhale 2 puffs into the lungs every 4 (four) hours as needed. , Disp: , Rfl:   Past Medical History: Past Medical History:  Diagnosis Date  . Asthma   . CAD (coronary artery disease)    a. 04/2016 NSTEMI/Cath: LM 20/90, LAD 40p, 59m, LCX 60ost, 28m, RCA 40p, 61m, RPDA 70, EF 55-65%;  b. 04/2016 s/p CABG x 2: LIMA->LAD, VG->RPL, VG->OM1->OM3.  . Diastolic dysfunction    a. 04/2016 Echo: EF 60-65%, grade 1 DD, mildly to mod dil RA.  Marland Kitchen Eczema   . Edema extremities   . GERD (gastroesophageal reflux disease)   . Hemorrhoid   . Pneumonia Feb 2016    Tobacco Use: History  Smoking Status  . Former Smoker  .  Packs/day: 2.00  . Years: 34.00  . Quit date: 12/05/2004  Smokeless Tobacco  . Never Used    Comment: Quit in 2007    Labs: Recent Review Flowsheet Data    Labs for ITP Cardiac and Pulmonary Rehab Latest Ref Rng & Units 05/05/2016 05/05/2016 05/05/2016 05/05/2016 05/06/2016   Cholestrol 0 - 200 mg/dL - - - - -   LDLCALC 0 - 99 mg/dL - - - - -   HDL >40 mg/dL - - - - -   Trlycerides <150 mg/dL - - - - -   Hemoglobin A1c 4.8 - 5.6 % - - - - -   PHART 7.350 - 7.450 7.318(L) 7.345(L) 7.332(L) - -   PCO2ART 32.0 - 48.0 mmHg 49.2(H) 44.9 42.1 - -   HCO3 20.0 - 28.0 mmol/L 25.5 24.8 22.5 - -   TCO2 0 - 100 mmol/L 27 26 24 24 28    ACIDBASEDEF 0.0 - 2.0 mmol/L 1.0 1.0 3.0(H) - -   O2SAT % 97.0 96.0 92.0 - -       Exercise Target Goals: Date: 06/28/16  Exercise Program Goal: Individual exercise prescription set with THRR, safety & activity barriers. Participant demonstrates ability to understand and report RPE using BORG scale, to self-measure pulse accurately, and to acknowledge the importance of the exercise prescription.  Exercise Prescription  Goal: Starting with aerobic activity 30 plus minutes a day, 3 days per week for initial exercise prescription. Provide home exercise prescription and guidelines that participant acknowledges understanding prior to discharge.  Activity Barriers & Risk Stratification:     Activity Barriers & Cardiac Risk Stratification - 06/28/16 1323      Activity Barriers & Cardiac Risk Stratification   Activity Barriers None  Left hip will hurt some with walking,resolves when he rests. Advised he talk to his MD about this symptom.    Cardiac Risk Stratification High      6 Minute Walk:     6 Minute Walk    Row Name 06/28/16 1526         6 Minute Walk   Phase Initial     Distance 1425 feet     Walk Time 6 minutes     # of Rest Breaks 0     MPH 2     METS 3.42     RPE 11     VO2 Peak 11.99     Symptoms No     Resting HR 58 bpm     Resting BP  118/62     Max Ex. HR 81 bpm     Max Ex. BP 142/60        Oxygen Initial Assessment:   Oxygen Re-Evaluation:   Oxygen Discharge (Final Oxygen Re-Evaluation):   Initial Exercise Prescription:     Initial Exercise Prescription - 06/28/16 1500      Date of Initial Exercise RX and Referring Provider   Date 06/28/16   Referring Provider End, Harrell Gave MD     Treadmill   MPH 2.5   Grade 0.5   Minutes 15   METs 3.09     NuStep   Level 3   SPM 80   Minutes 15   METs 2     Recumbant Elliptical   Level 2   RPM 50   Minutes 15   METs 2     Prescription Details   Frequency (times per week) 3   Duration Progress to 45 minutes of aerobic exercise without signs/symptoms of physical distress     Intensity   THRR 40-80% of Max Heartrate 98-139   Ratings of Perceived Exertion 11-13   Perceived Dyspnea 0-4     Progression   Progression Continue to progress workloads to maintain intensity without signs/symptoms of physical distress.     Resistance Training   Training Prescription Yes   Weight 4 lbs   Reps 10-15      Perform Capillary Blood Glucose checks as needed.  Exercise Prescription Changes:     Exercise Prescription Changes    Row Name 06/28/16 1500             Response to Exercise   Blood Pressure (Admit) 118/62       Blood Pressure (Exercise) 142/60       Heart Rate (Admit) 58 bpm       Heart Rate (Exercise) 81 bpm       Oxygen Saturation (Admit) 97 %       Rating of Perceived Exertion (Exercise) 11       Symptoms none       Comments walk test results          Exercise Comments:   Exercise Goals and Review:     Exercise Goals    Row Name 06/28/16 1530  Exercise Goals   Increase Physical Activity Yes       Intervention Provide advice, education, support and counseling about physical activity/exercise needs.;Develop an individualized exercise prescription for aerobic and resistive training based on initial evaluation  findings, risk stratification, comorbidities and participant's personal goals.       Expected Outcomes Achievement of increased cardiorespiratory fitness and enhanced flexibility, muscular endurance and strength shown through measurements of functional capacity and personal statement of participant.       Increase Strength and Stamina Yes       Intervention Provide advice, education, support and counseling about physical activity/exercise needs.;Develop an individualized exercise prescription for aerobic and resistive training based on initial evaluation findings, risk stratification, comorbidities and participant's personal goals.       Expected Outcomes Achievement of increased cardiorespiratory fitness and enhanced flexibility, muscular endurance and strength shown through measurements of functional capacity and personal statement of participant.          Exercise Goals Re-Evaluation :   Discharge Exercise Prescription (Final Exercise Prescription Changes):     Exercise Prescription Changes - 06/28/16 1500      Response to Exercise   Blood Pressure (Admit) 118/62   Blood Pressure (Exercise) 142/60   Heart Rate (Admit) 58 bpm   Heart Rate (Exercise) 81 bpm   Oxygen Saturation (Admit) 97 %   Rating of Perceived Exertion (Exercise) 11   Symptoms none   Comments walk test results      Nutrition:  Target Goals: Understanding of nutrition guidelines, daily intake of sodium 1500mg , cholesterol 200mg , calories 30% from fat and 7% or less from saturated fats, daily to have 5 or more servings of fruits and vegetables.  Biometrics:     Pre Biometrics - 06/28/16 1530      Pre Biometrics   Height 5\' 10"  (1.778 m)   Weight 203 lb 14.4 oz (92.5 kg)   Waist Circumference 44.25 inches   Hip Circumference 42.5 inches   Waist to Hip Ratio 1.04 %   BMI (Calculated) 29.3   Single Leg Stand 30 seconds       Nutrition Therapy Plan and Nutrition Goals:     Nutrition Therapy & Goals -  06/28/16 1311      Intervention Plan   Intervention Prescribe, educate and counsel regarding individualized specific dietary modifications aiming towards targeted core components such as weight, hypertension, lipid management, diabetes, heart failure and other comorbidities.   Expected Outcomes Short Term Goal: Understand basic principles of dietary content, such as calories, fat, sodium, cholesterol and nutrients.;Short Term Goal: A plan has been developed with personal nutrition goals set during dietitian appointment.;Long Term Goal: Adherence to prescribed nutrition plan.      Nutrition Discharge: Rate Your Plate Scores:     Nutrition Assessments - 06/28/16 1311      MEDFICTS Scores   Pre Score 3      Nutrition Goals Re-Evaluation:   Nutrition Goals Discharge (Final Nutrition Goals Re-Evaluation):   Psychosocial: Target Goals: Acknowledge presence or absence of significant depression and/or stress, maximize coping skills, provide positive support system. Participant is able to verbalize types and ability to use techniques and skills needed for reducing stress and depression.   Initial Review & Psychosocial Screening:     Initial Psych Review & Screening - 06/28/16 1317      Initial Review   Current issues with None Identified  Is concerned about overdoing it after having open heart surgery. Is 8 weeks out form surgery and  is noticing incerased ability to perform daily activities.      Family Dynamics   Good Support System? Yes     Barriers   Psychosocial barriers to participate in program There are no identifiable barriers or psychosocial needs.;The patient should benefit from training in stress management and relaxation.     Screening Interventions   Interventions Encouraged to exercise;Provide feedback about the scores to participant;To provide support and resources with identified psychosocial needs  Work stress is only identified stress.       Quality of Life  Scores:      Quality of Life - 06/28/16 1319      Quality of Life Scores   Health/Function Pre 21.6 %   Socioeconomic Pre 26.57 %   Psych/Spiritual Pre 26.57 %   Family Pre 25.2 %   GLOBAL Pre 24.18 %      PHQ-9: Recent Review Flowsheet Data    Depression screen [facility] 2/9 06/28/2016   Decreased Interest 0   Down, Depressed, Hopeless 0   PHQ - 2 Score 0   Altered sleeping 1   Tired, decreased energy 1   Change in appetite 0   Feeling bad or failure about yourself  0   Trouble concentrating 0   Moving slowly or fidgety/restless 0   Suicidal thoughts 0   PHQ-9 Score 2   Difficult doing work/chores Not difficult at all     Interpretation of Total Score  Total Score Depression Severity:  1-4 = Minimal depression, 5-9 = Mild depression, 10-14 = Moderate depression, 15-19 = Moderately severe depression, 20-27 = Severe depression   Psychosocial Evaluation and Intervention:   Psychosocial Re-Evaluation:   Psychosocial Discharge (Final Psychosocial Re-Evaluation):   Vocational Rehabilitation: Provide vocational rehab assistance to qualifying candidates.   Vocational Rehab Evaluation & Intervention:     Vocational Rehab - 06/28/16 1322      Initial Vocational Rehab Evaluation & Intervention   Assessment shows need for Vocational Rehabilitation No      Education: Education Goals: Education classes will be provided on a weekly basis, covering required topics. Participant will state understanding/return demonstration of topics presented.  Learning Barriers/Preferences:     Learning Barriers/Preferences - 06/28/16 1320      Learning Barriers/Preferences   Learning Barriers None   Learning Preferences None      Education Topics: General Nutrition Guidelines/Fats and Fiber: -Group instruction provided by verbal, written material, models and posters to present the general guidelines for heart healthy nutrition. Gives an explanation and review of dietary fats and  fiber.   Controlling Sodium/Reading Food Labels: -Group verbal and written material supporting the discussion of sodium use in heart healthy nutrition. Review and explanation with models, verbal and written materials for utilization of the food label.   Exercise Physiology & Risk Factors: - Group verbal and written instruction with models to review the exercise physiology of the cardiovascular system and associated critical values. Details cardiovascular disease risk factors and the goals associated with each risk factor.   Aerobic Exercise & Resistance Training: - Gives group verbal and written discussion on the health impact of inactivity. On the components of aerobic and resistive training programs and the benefits of this training and how to safely progress through these programs.   Flexibility, Balance, General Exercise Guidelines: - Provides group verbal and written instruction on the benefits of flexibility and balance training programs. Provides general exercise guidelines with specific guidelines to those with heart or lung disease. Demonstration and skill practice provided.  Stress Management: - Provides group verbal and written instruction about the health risks of elevated stress, cause of high stress, and healthy ways to reduce stress.   Depression: - Provides group verbal and written instruction on the correlation between heart/lung disease and depressed mood, treatment options, and the stigmas associated with seeking treatment.   Anatomy & Physiology of the Heart: - Group verbal and written instruction and models provide basic cardiac anatomy and physiology, with the coronary electrical and arterial systems. Review of: AMI, Angina, Valve disease, Heart Failure, Cardiac Arrhythmia, Pacemakers, and the ICD.   Cardiac Procedures: - Group verbal and written instruction and models to describe the testing methods done to diagnose heart disease. Reviews the outcomes of the  test results. Describes the treatment choices: Medical Management, Angioplasty, or Coronary Bypass Surgery.   Cardiac Medications: - Group verbal and written instruction to review commonly prescribed medications for heart disease. Reviews the medication, class of the drug, and side effects. Includes the steps to properly store meds and maintain the prescription regimen.   Go Sex-Intimacy & Heart Disease, Get SMART - Goal Setting: - Group verbal and written instruction through game format to discuss heart disease and the return to sexual intimacy. Provides group verbal and written material to discuss and apply goal setting through the application of the S.M.A.R.T. Method.   Other Matters of the Heart: - Provides group verbal, written materials and models to describe Heart Failure, Angina, Valve Disease, and Diabetes in the realm of heart disease. Includes description of the disease process and treatment options available to the cardiac patient.   Exercise & Equipment Safety: - Individual verbal instruction and demonstration of equipment use and safety with use of the equipment.   Cardiac Rehab from 06/28/2016 in [facility] Cardiac and Pulmonary Rehab  Date  06/28/16  Educator  Sb  Instruction Review Code  2- meets goals/outcomes      Infection Prevention: - Provides verbal and written material to individual with discussion of infection control including proper hand washing and proper equipment cleaning during exercise session.   Cardiac Rehab from 06/28/2016 in [facility] Cardiac and Pulmonary Rehab  Date  06/28/16  Educator  Sb  Instruction Review Code  2- meets goals/outcomes      Falls Prevention: - Provides verbal and written material to individual with discussion of falls prevention and safety.   Cardiac Rehab from 06/28/2016 in [facility] Cardiac and Pulmonary Rehab  Date  06/28/16  Educator  SB  Instruction Review Code  2- meets goals/outcomes      Diabetes: - Individual verbal and written  instruction to review signs/symptoms of diabetes, desired ranges of glucose level fasting, after meals and with exercise. Advice that pre and post exercise glucose checks will be done for 3 sessions at entry of program.    Knowledge Questionnaire Score:     Knowledge Questionnaire Score - 06/28/16 1321      Knowledge Questionnaire Score   Pre Score 20/28  Reviewed correct responses with Tommy today. He verbalized understanding and had no questions today.      Core Components/Risk Factors/Patient Goals at Admission:     Personal Goals and Risk Factors at Admission - 06/28/16 1322      Core Components/Risk Factors/Patient Goals on Admission    Weight Management Yes;Obesity;Weight Loss   Intervention Weight Management: Develop a combined nutrition and exercise program designed to reach desired caloric intake, while maintaining appropriate intake of nutrient and fiber, sodium and fats, and appropriate energy expenditure required for  the weight goal.;Weight Management: Provide education and appropriate resources to help participant work on and attain dietary goals.;Weight Management/Obesity: Establish reasonable short term and long term weight goals.;Obesity: Provide education and appropriate resources to help participant work on and attain dietary goals.  Has lost 20 pounds since surgery   Admit Weight 204 lb 14.4 oz (92.9 kg)   Goal Weight: Short Term 200 lb (90.7 kg)   Goal Weight: Long Term 170 lb (77.1 kg)   Expected Outcomes Short Term: Continue to assess and modify interventions until short term weight is achieved;Long Term: Adherence to nutrition and physical activity/exercise program aimed toward attainment of established weight goal;Weight Loss: Understanding of general recommendations for a balanced deficit meal plan, which promotes 1-2 lb weight loss per week and includes a negative energy balance of [id number] kcal/d;Understanding recommendations for meals to include 15-35% energy as  protein, 25-35% energy from fat, 35-60% energy from carbohydrates, less than 200mg  of dietary cholesterol, 20-35 gm of total fiber daily;Understanding of distribution of calorie intake throughout the day with the consumption of 4-5 meals/snacks   Hypertension Yes   Intervention Provide education on lifestyle modifcations including regular physical activity/exercise, weight management, moderate sodium restriction and increased consumption of fresh fruit, vegetables, and low fat dairy, alcohol moderation, and smoking cessation.;Monitor prescription use compliance.   Expected Outcomes Short Term: Continued assessment and intervention until BP is < 140/89mm HG in hypertensive participants. < 130/73mm HG in hypertensive participants with diabetes, heart failure or chronic kidney disease.;Long Term: Maintenance of blood pressure at goal levels.   Lipids Yes   Intervention Provide education and support for participant on nutrition & aerobic/resistive exercise along with prescribed medications to achieve LDL 70mg , HDL >40mg .   Expected Outcomes Short Term: Participant states understanding of desired cholesterol values and is compliant with medications prescribed. Participant is following exercise prescription and nutrition guidelines.;Long Term: Cholesterol controlled with medications as prescribed, with individualized exercise RX and with personalized nutrition plan. Value goals: LDL < 70mg , HDL > 40 mg.   Stress Yes   Intervention Offer individual and/or small group education and counseling on adjustment to heart disease, stress management and health-related lifestyle change. Teach and support self-help strategies.;Refer participants experiencing significant psychosocial distress to appropriate mental health specialists for further evaluation and treatment. When possible, include family members and significant others in education/counseling sessions.   Expected Outcomes Short Term: Participant demonstrates  changes in health-related behavior, relaxation and other stress management skills, ability to obtain effective social support, and compliance with psychotropic medications if prescribed.;Long Term: Emotional wellbeing is indicated by absence of clinically significant psychosocial distress or social isolation.      Core Components/Risk Factors/Patient Goals Review:    Core Components/Risk Factors/Patient Goals at Discharge (Final Review):    ITP Comments:     ITP Comments    Row Name 06/28/16 1307           ITP Comments Medical review completed today. ITP created. Documentation of diagnosis can be found in [facility] 05/04/2016          Comments: Initial ITP

## 2016-07-02 ENCOUNTER — Other Ambulatory Visit
Admission: RE | Admit: 2016-07-02 | Discharge: 2016-07-02 | Disposition: A | Discharge status: Home / Self Care | Payer: 59 | Source: Ambulatory Visit | Attending: Nurse Practitioner | Admitting: Nurse Practitioner

## 2016-07-02 DIAGNOSIS — E785 Hyperlipidemia, unspecified: Secondary | ICD-10-CM | POA: Diagnosis not present

## 2016-07-02 LAB — HEPATIC FUNCTION PANEL
ALBUMIN: 3.9 g/dL (ref 3.5–5.0)
ALK PHOS: 89 U/L (ref 38–126)
ALT: 23 U/L (ref 17–63)
AST: 20 U/L (ref 15–41)
BILIRUBIN INDIRECT: 0.9 mg/dL (ref 0.3–0.9)
BILIRUBIN TOTAL: 1.1 mg/dL (ref 0.3–1.2)
Bilirubin, Direct: 0.2 mg/dL (ref 0.1–0.5)
TOTAL PROTEIN: 6.7 g/dL (ref 6.5–8.1)

## 2016-07-02 LAB — LIPID PANEL
CHOL/HDL RATIO: 3.6 ratio
CHOLESTEROL: 100 mg/dL (ref 0–200)
HDL: 28 mg/dL — AB (ref >40)
LDL Cholesterol: 52 mg/dL (ref 0–99)
Triglycerides: 100 mg/dL (ref <150)
VLDL: 20 mg/dL (ref 0–40)

## 2016-07-05 ENCOUNTER — Encounter: Payer: 59 | Admitting: *Deleted

## 2016-07-05 DIAGNOSIS — I214 Non-ST elevation (NSTEMI) myocardial infarction: Secondary | ICD-10-CM

## 2016-07-05 DIAGNOSIS — Z951 Presence of aortocoronary bypass graft: Secondary | ICD-10-CM

## 2016-07-05 NOTE — Progress Notes (Signed)
Daily Session Note  Patient Details  Name: Alan Fernandez MRN: [id number] Date of Birth: [birth year] Referring Provider:     Cardiac Rehab from 06/28/2016 in [facility] Cardiac and Pulmonary Rehab  Referring Provider  End, Harrell Gave MD      Encounter Date: 07/05/2016  Check In:     Session Check In - 07/05/16 1633      Check-In   Staff Present Heath Lark, RN, BSN, CCRP;Mary Kellie Shropshire, RN, BSN, [place], BS, ACSM CEP, Exercise Physiologist   Supervising physician immediately available to respond to emergencies See telemetry face sheet for immediately available ER MD   Medication changes reported     No   Fall or balance concerns reported    No   Tobacco Cessation No Change   Warm-up and Cool-down Performed on first and last piece of equipment   Resistance Training Performed Yes   VAD Patient? No     Pain Assessment   Currently in Pain? No/denies         History  Smoking Status  . Former Smoker  . Packs/day: 2.00  . Years: 34.00  . Quit date: 12/05/2004  Smokeless Tobacco  . Never Used    Comment: Quit in 2007    Goals Met:  Exercise tolerated well Personal goals reviewed No report of cardiac concerns or symptoms Strength training completed today  Goals Unmet:  Not Applicable  Comments: First full day of exercise!  Alan Fernandez was oriented to gym and equipment including functions, settings, policies, and procedures.  Alan Fernandez's individual exercise prescription and treatment plan were reviewed.  All starting workloads were established based on the results of the 6 minute walk test done at initial orientation visit.  The plan for exercise progression was also introduced and progression will be customized based on patient's performance and goals.    Dr. Emily Filbert is Medical Director for [facility].

## 2016-07-07 ENCOUNTER — Encounter: Payer: 59 | Admitting: *Deleted

## 2016-07-07 DIAGNOSIS — Z951 Presence of aortocoronary bypass graft: Secondary | ICD-10-CM

## 2016-07-07 DIAGNOSIS — I214 Non-ST elevation (NSTEMI) myocardial infarction: Secondary | ICD-10-CM | POA: Diagnosis not present

## 2016-07-07 NOTE — Progress Notes (Signed)
Daily Session Note  Patient Details  Name: Alan Fernandez MRN: [id number] Date of Birth: 02/01/1956 Referring Provider:     Cardiac Rehab from 06/28/2016 in [facility] Cardiac and Pulmonary Rehab  Referring Provider  End, Harrell Gave MD      Encounter Date: 07/07/2016  Check In:     Session Check In - 07/07/16 1648      Check-In   Location [facility]-Cardiac & Pulmonary Rehab   Staff Present Nyoka Cowden, RN, BSN, MA;Debria Broecker, RN, Vickki Hearing, BA, ACSM CEP, Exercise Physiologist   Supervising physician immediately available to respond to emergencies See telemetry face sheet for immediately available ER MD   Medication changes reported     No   Fall or balance concerns reported    No   Tobacco Cessation No Change   Warm-up and Cool-down Performed on first and last piece of equipment   Resistance Training Performed Yes   VAD Patient? No     Pain Assessment   Currently in Pain? No/denies           Exercise Prescription Changes - 07/07/16 1400      Response to Exercise   Blood Pressure (Admit) 104/62   Blood Pressure (Exercise) 144/72   Blood Pressure (Exit) 100/60   Heart Rate (Admit) 69 bpm   Heart Rate (Exercise) 92 bpm   Heart Rate (Exit) 72 bpm   Rating of Perceived Exertion (Exercise) 12     Resistance Training   Training Prescription No   Weight 4   Reps 10-15     Treadmill   MPH 2.5   Grade 0.5   Minutes 15   METs 3.09      History  Smoking Status  . Former Smoker  . Packs/day: 2.00  . Years: 34.00  . Quit date: 12/05/2004  Smokeless Tobacco  . Never Used    Comment: Quit in 2007    Goals Met:  Proper associated with RPD/PD & O2 Sat Exercise tolerated well No report of cardiac concerns or symptoms  Goals Unmet:  Not Applicable  Comments:     Dr. Emily Filbert is Medical Director for [facility].

## 2016-07-08 DIAGNOSIS — I214 Non-ST elevation (NSTEMI) myocardial infarction: Secondary | ICD-10-CM | POA: Diagnosis not present

## 2016-07-08 DIAGNOSIS — Z951 Presence of aortocoronary bypass graft: Secondary | ICD-10-CM

## 2016-07-08 NOTE — Progress Notes (Signed)
Daily Session Note  Patient Details  Name: Alan Fernandez MRN: [id number] Date of Birth: 17-Jan-1956 Referring Provider:     Cardiac Rehab from 06/28/2016 in [facility] Cardiac and Pulmonary Rehab  Referring Provider  End, Harrell Gave MD      Encounter Date: 07/08/2016  Check In:     Session Check In - 07/08/16 1659      Check-In   Location [facility]-Cardiac & Pulmonary Rehab   Staff Present Gerlene Burdock, RN, Vickki Hearing, BA, ACSM CEP, Exercise Physiologist;Kelly Amedeo Plenty, BS, ACSM CEP, Exercise Physiologist   Supervising physician immediately available to respond to emergencies See telemetry face sheet for immediately available ER MD   Medication changes reported     No   Fall or balance concerns reported    No   Warm-up and Cool-down Performed on first and last piece of equipment   Resistance Training Performed Yes   VAD Patient? No     Pain Assessment   Currently in Pain? No/denies         History  Smoking Status  . Former Smoker  . Packs/day: 2.00  . Years: 34.00  . Quit date: 12/05/2004  Smokeless Tobacco  . Never Used    Comment: Quit in 2007    Goals Met:  Independence with exercise equipment Exercise tolerated well No report of cardiac concerns or symptoms Strength training completed today  Goals Unmet:  Not Applicable  Comments: Pt able to follow exercise prescription today without complaint.  Will continue to monitor for progression.    Dr. Emily Filbert is Medical Director for [place] and [facility].

## 2016-07-12 ENCOUNTER — Encounter: Payer: 59 | Admitting: *Deleted

## 2016-07-12 DIAGNOSIS — Z951 Presence of aortocoronary bypass graft: Secondary | ICD-10-CM

## 2016-07-12 DIAGNOSIS — I214 Non-ST elevation (NSTEMI) myocardial infarction: Secondary | ICD-10-CM | POA: Diagnosis not present

## 2016-07-12 NOTE — Progress Notes (Signed)
Daily Session Note  Patient Details  Name: [name] MRN: [id number] Date of Birth: 1955/11/11 Referring Provider:     Cardiac Rehab from 06/28/2016 in [facility] Cardiac and Pulmonary Rehab  Referring Provider  End, Harrell Gave MD      Encounter Date: 07/12/2016  Check In:     Session Check In - 07/12/16 1619      Check-In   Location [facility]-Cardiac & Pulmonary Rehab   Staff Present Nyoka Cowden, RN, BSN, MA;Susanne Bice, RN, BSN, CCRP;Meredith Sherryll Burger, RN Moises Blood, BS, ACSM CEP, Exercise Physiologist   Supervising physician immediately available to respond to emergencies See telemetry face sheet for immediately available ER MD   Medication changes reported     No   Fall or balance concerns reported    No   Warm-up and Cool-down Performed on first and last piece of equipment   Resistance Training Performed Yes   VAD Patient? No     Pain Assessment   Currently in Pain? No/denies         History  Smoking Status  . Former Smoker  . Packs/day: 2.00  . Years: 34.00  . Quit date: 12/05/2004  Smokeless Tobacco  . Never Used    Comment: Quit in 2007    Goals Met:  Independence with exercise equipment Exercise tolerated well No report of cardiac concerns or symptoms Strength training completed today  Goals Unmet:  Not Applicable  Comments: Pt able to follow exercise prescription today without complaint.  Will continue to monitor for progression.    Dr. Emily Filbert is Medical Director for [place] and [facility].

## 2016-07-14 ENCOUNTER — Encounter: Payer: 59 | Admitting: *Deleted

## 2016-07-14 ENCOUNTER — Encounter: Payer: Self-pay | Admitting: *Deleted

## 2016-07-14 DIAGNOSIS — Z951 Presence of aortocoronary bypass graft: Secondary | ICD-10-CM

## 2016-07-14 DIAGNOSIS — I214 Non-ST elevation (NSTEMI) myocardial infarction: Secondary | ICD-10-CM

## 2016-07-14 NOTE — Progress Notes (Signed)
Daily Session Note  Patient Details  Name: [name] MRN: [id number] Date of Birth: [birth year] Referring Provider:     Cardiac Rehab from 06/28/2016 in [facility] Cardiac and Pulmonary Rehab  Referring Provider  End, Harrell Gave MD      Encounter Date: 07/14/2016  Check In:     Session Check In - 07/14/16 1735      Check-In   Location [facility]-Cardiac & Pulmonary Rehab   Staff Present Gerlene Burdock, RN, Vickki Hearing, BA, ACSM CEP, Exercise Physiologist;Neshawn Aird Sherryll Burger, RN BSN   Supervising physician immediately available to respond to emergencies See telemetry face sheet for immediately available ER MD   Medication changes reported     No   Fall or balance concerns reported    No   Warm-up and Cool-down Performed on first and last piece of equipment   Resistance Training Performed Yes   VAD Patient? No     Pain Assessment   Currently in Pain? No/denies         History  Smoking Status  . Former Smoker  . Packs/day: 2.00  . Years: 34.00  . Quit date: 12/05/2004  Smokeless Tobacco  . Never Used    Comment: Quit in 2007    Goals Met:  Independence with exercise equipment Exercise tolerated well No report of cardiac concerns or symptoms Strength training completed today  Goals Unmet:  Not Applicable  Comments: Pt able to follow exercise prescription today without complaint.  Will continue to monitor for progression.    Dr. Emily Filbert is Medical Director for [place] and [facility].

## 2016-07-14 NOTE — Progress Notes (Signed)
Cardiac Individual Treatment Plan  Patient Details  Name: Alan Fernandez MRN: [id number] Date of Birth: May 14, 1955 Referring Provider:     Cardiac Rehab from 06/28/2016 in [facility] Cardiac and Pulmonary Rehab  Referring Provider  End, Harrell Gave MD      Initial Encounter Date:    Cardiac Rehab from 06/28/2016 in [facility] Cardiac and Pulmonary Rehab  Date  06/28/16  Referring Provider  End, Harrell Gave MD      Visit Diagnosis: S/P CABG x 4  NSTEMI (non-ST elevated myocardial infarction) [facility])  Patient's Home Medications on Admission:  Current Outpatient Prescriptions:  .  aspirin EC 325 MG EC tablet, Take 1 tablet (325 mg total) by mouth daily., Disp: 30 tablet, Rfl: 0 .  atorvastatin (LIPITOR) 40 MG tablet, Take 1 tablet (40 mg total) by mouth daily at 6 PM., Disp: 90 tablet, Rfl: 2 .  clobetasol (TEMOVATE) 0.05 % external solution, Apply 1 application topically as needed. Rash, Disp: , Rfl:  .  lisinopril (PRINIVIL,ZESTRIL) 2.5 MG tablet, Take 1 tablet (2.5 mg total) by mouth daily., Disp: 90 tablet, Rfl: 0 .  metoprolol tartrate (LOPRESSOR) 25 MG tablet, Take 1 tablet (25 mg total) by mouth 2 (two) times daily., Disp: 180 tablet, Rfl: 0 .  montelukast (SINGULAIR) 10 MG tablet, Take 1 tablet by mouth daily. At bedtime., Disp: , Rfl:  .  VENTOLIN HFA 108 (90 BASE) MCG/ACT inhaler, Inhale 2 puffs into the lungs every 4 (four) hours as needed. , Disp: , Rfl:   Past Medical History: Past Medical History:  Diagnosis Date  . Asthma   . CAD (coronary artery disease)    a. 04/2016 NSTEMI/Cath: LM 20/90, LAD 40p, 17m LCX 60ost, 723mRCA 40p, 3039mPDA 70, EF 55-65%;  b. 04/2016 s/p CABG x 2: LIMA->LAD, VG->RPL, VG->OM1->OM3.  . Diastolic dysfunction    a. 04/2016 Echo: EF 60-65%, grade 1 DD, mildly to mod dil RA.  . EMarland Kitchenzema   . Edema extremities   . GERD (gastroesophageal reflux disease)   . Hemorrhoid   . Pneumonia Feb 2016    Tobacco Use: History  Smoking Status  . Former Smoker  .  Packs/day: 2.00  . Years: 34.00  . Quit date: 12/05/2004  Smokeless Tobacco  . Never Used    Comment: Quit in 2007    Labs: Recent Review Flowsheet Data    Labs for ITP Cardiac and Pulmonary Rehab Latest Ref Rng & Units 05/05/2016 05/05/2016 05/05/2016 05/06/2016 07/02/2016   Cholestrol 0 - 200 mg/dL - - - - 100   LDLCALC 0 - 99 mg/dL - - - - 52   HDL >40 mg/dL - - - - 28(L)   Trlycerides <150 mg/dL - - - - 100   Hemoglobin A1c 4.8 - 5.6 % - - - - -   PHART 7.350 - 7.450 7.345(L) 7.332(L) - - -   PCO2ART 32.0 - 48.0 mmHg 44.9 42.1 - - -   HCO3 20.0 - 28.0 mmol/L 24.8 22.5 - - -   TCO2 0 - 100 mmol/L _0 -   ACIDBASEDEF 0.0 - 2.0 mmol/L 1.0 3.0(H) - - -   O2SAT % 96.0 92.0 - - -       Exercise Target Goals:    Exercise Program Goal: Individual exercise prescription set with THRR, safety & activity barriers. Participant demonstrates ability to understand and report RPE using BORG scale, to self-measure pulse accurately, and to acknowledge the importance of the exercise prescription.  Exercise Prescription  Goal: Starting with aerobic activity 30 plus minutes a day, 3 days per week for initial exercise prescription. Provide home exercise prescription and guidelines that participant acknowledges understanding prior to discharge.  Activity Barriers & Risk Stratification:     Activity Barriers & Cardiac Risk Stratification - 06/28/16 1323      Activity Barriers & Cardiac Risk Stratification   Activity Barriers None  Left hip will hurt some with walking,resolves when he rests. Advised he talk to his MD about this symptom.    Cardiac Risk Stratification High      6 Minute Walk:     6 Minute Walk    Row Name 06/28/16 1526         6 Minute Walk   Phase Initial     Distance 1425 feet     Walk Time 6 minutes     # of Rest Breaks 0     MPH 2     METS 3.42     RPE 11     VO2 Peak 11.99     Symptoms No     Resting HR 58 bpm     Resting BP 118/62     Max Ex. HR 81  bpm     Max Ex. BP 142/60        Oxygen Initial Assessment:   Oxygen Re-Evaluation:   Oxygen Discharge (Final Oxygen Re-Evaluation):   Initial Exercise Prescription:     Initial Exercise Prescription - 06/28/16 1500      Date of Initial Exercise RX and Referring Provider   Date 06/28/16   Referring Provider End, Harrell Gave MD     Treadmill   MPH 2.5   Grade 0.5   Minutes 15   METs 3.09     NuStep   Level 3   SPM 80   Minutes 15   METs 2     Recumbant Elliptical   Level 2   RPM 50   Minutes 15   METs 2     Prescription Details   Frequency (times per week) 3   Duration Progress to 45 minutes of aerobic exercise without signs/symptoms of physical distress     Intensity   THRR 40-80% of Max Heartrate 98-139   Ratings of Perceived Exertion 11-13   Perceived Dyspnea 0-4     Progression   Progression Continue to progress workloads to maintain intensity without signs/symptoms of physical distress.     Resistance Training   Training Prescription Yes   Weight 4 lbs   Reps 10-15      Perform Capillary Blood Glucose checks as needed.  Exercise Prescription Changes:     Exercise Prescription Changes    Row Name 06/28/16 1500 07/07/16 1400           Response to Exercise   Blood Pressure (Admit) 118/62 104/62      Blood Pressure (Exercise) 142/60 144/72      Blood Pressure (Exit)  - 100/60      Heart Rate (Admit) 58 bpm 69 bpm      Heart Rate (Exercise) 81 bpm 92 bpm      Heart Rate (Exit)  - 72 bpm      Oxygen Saturation (Admit) 97 %  -      Rating of Perceived Exertion (Exercise) 11 12      Symptoms none  -      Comments walk test results  -        Horticulturist, commercial  Prescription  - No      Weight  - 4      Reps  - 10-15        Treadmill   MPH  - 2.5      Grade  - 0.5      Minutes  - 15      METs  - 3.09         Exercise Comments:     Exercise Comments    Row Name 07/05/16 1753 07/07/16 1423         Exercise  Comments  First full day of exercise!  Alan Fernandez was oriented to gym and equipment including functions, settings, policies, and procedures.  Alan Fernandez's individual exercise prescription and treatment plan were reviewed.  All starting workloads were established based on the results of the 6 minute walk test done at initial orientation visit.  The plan for exercise progression was also introduced and progression will be customized based on patient's performance and goals. Tommy completed first full day of exercise with no issues.  Staff reviewed safyet, THR and RPE.         Exercise Goals and Review:     Exercise Goals    Row Name 06/28/16 1530             Exercise Goals   Increase Physical Activity Yes       Intervention Provide advice, education, support and counseling about physical activity/exercise needs.;Develop an individualized exercise prescription for aerobic and resistive training based on initial evaluation findings, risk stratification, comorbidities and participant's personal goals.       Expected Outcomes Achievement of increased cardiorespiratory fitness and enhanced flexibility, muscular endurance and strength shown through measurements of functional capacity and personal statement of participant.       Increase Strength and Stamina Yes       Intervention Provide advice, education, support and counseling about physical activity/exercise needs.;Develop an individualized exercise prescription for aerobic and resistive training based on initial evaluation findings, risk stratification, comorbidities and participant's personal goals.       Expected Outcomes Achievement of increased cardiorespiratory fitness and enhanced flexibility, muscular endurance and strength shown through measurements of functional capacity and personal statement of participant.          Exercise Goals Re-Evaluation :   Discharge Exercise Prescription (Final Exercise Prescription Changes):     Exercise Prescription  Changes - 07/07/16 1400      Response to Exercise   Blood Pressure (Admit) 104/62   Blood Pressure (Exercise) 144/72   Blood Pressure (Exit) 100/60   Heart Rate (Admit) 69 bpm   Heart Rate (Exercise) 92 bpm   Heart Rate (Exit) 72 bpm   Rating of Perceived Exertion (Exercise) 12     Resistance Training   Training Prescription No   Weight 4   Reps 10-15     Treadmill   MPH 2.5   Grade 0.5   Minutes 15   METs 3.09      Nutrition:  Target Goals: Understanding of nutrition guidelines, daily intake of sodium <1532m, cholesterol <2032m calories 30% from fat and 7% or less from saturated fats, daily to have 5 or more servings of fruits and vegetables.  Biometrics:     Pre Biometrics - 06/28/16 1530      Pre Biometrics   Height _0  (1.778 m)   Weight 203 lb 14.4 oz (92.5 kg)   Waist Circumference 44.25 inches   Hip Circumference 42.5 inches   Waist  to Hip Ratio 1.04 %   BMI (Calculated) 29.3   Single Leg Stand 30 seconds       Nutrition Therapy Plan and Nutrition Goals:     Nutrition Therapy & Goals - 06/28/16 1311      Intervention Plan   Intervention Prescribe, educate and counsel regarding individualized specific dietary modifications aiming towards targeted core components such as weight, hypertension, lipid management, diabetes, heart failure and other comorbidities.   Expected Outcomes Short Term Goal: Understand basic principles of dietary content, such as calories, fat, sodium, cholesterol and nutrients.;Short Term Goal: A plan has been developed with personal nutrition goals set during dietitian appointment.;Long Term Goal: Adherence to prescribed nutrition plan.      Nutrition Discharge: Rate Your Plate Scores:     Nutrition Assessments - 06/28/16 1311      MEDFICTS Scores   Pre Score 3      Nutrition Goals Re-Evaluation:   Nutrition Goals Discharge (Final Nutrition Goals Re-Evaluation):   Psychosocial: Target Goals: Acknowledge presence  or absence of significant depression and/or stress, maximize coping skills, provide positive support system. Participant is able to verbalize types and ability to use techniques and skills needed for reducing stress and depression.   Initial Review & Psychosocial Screening:     Initial Psych Review & Screening - 06/28/16 1317      Initial Review   Current issues with None Identified  Is concerned about overdoing it after having open heart surgery. Is 8 weeks out form surgery and is noticing incerased ability to perform daily activities.      Family Dynamics   Good Support System? Yes     Barriers   Psychosocial barriers to participate in program There are no identifiable barriers or psychosocial needs.;The patient should benefit from training in stress management and relaxation.     Screening Interventions   Interventions Encouraged to exercise;Provide feedback about the scores to participant;To provide support and resources with identified psychosocial needs  Work stress is only identified stress.       Quality of Life Scores:      Quality of Life - 06/28/16 1319      Quality of Life Scores   Health/Function Pre 21.6 %   Socioeconomic Pre 26.57 %   Psych/Spiritual Pre 26.57 %   Family Pre 25.2 %   GLOBAL Pre 24.18 %      PHQ-9: Recent Review Flowsheet Data    Depression screen [facility] 2/9 06/28/2016   Decreased Interest 0   Down, Depressed, Hopeless 0   PHQ - 2 Score 0   Altered sleeping 1   Tired, decreased energy 1   Change in appetite 0   Feeling bad or failure about yourself  0   Trouble concentrating 0   Moving slowly or fidgety/restless 0   Suicidal thoughts 0   PHQ-9 Score 2   Difficult doing work/chores Not difficult at all     Interpretation of Total Score  Total Score Depression Severity:  1-4 = Minimal depression, 5-9 = Mild depression, 10-14 = Moderate depression, 15-19 = Moderately severe depression, 20-27 = Severe depression   Psychosocial Evaluation  and Intervention:     Psychosocial Evaluation - 07/07/16 1724      Psychosocial Evaluation & Interventions   Interventions Encouraged to exercise with the program and follow exercise prescription;Stress management education;Relaxation education   Comments Counselor met with Mr. Brobeck today Konrad Dolores) for his initial psychosocial evaluation.  He is a 61 year old who had a  heart attack and CABGx4 recently.   Orvilla Fus has a strong support system with a spouse of 21 years and (4) adult children who live locally.  He reports difficulty getting to sleep which has been a chronic problem for a long time - and he takes an OTC medication to help with this.  He has a good appetite.  Orvilla Fus denies a history or any current symptoms of depression or anxiety.  He has minimal stress in his life other than his health and job somewhat and reports he is typically in a positive mood.  Orvilla Fus has home gym equipment and he loves to walk - which is what he plans to do following this program.  His goals are to improve his health overall, increase his energy and to lose some weight in the process.  Staff will follow with Tommy throughout the course of this program.    Expected Outcomes Tommy will exercise consistently to achieve his stated goals.  He will benefit from the psychoeducational components of this program to learn positive ways to cope with stress in his life.  He will also benefit from meeting with the dietician to address his weight loss goals.     Continue Psychosocial Services  Follow up required by staff      Psychosocial Re-Evaluation:   Psychosocial Discharge (Final Psychosocial Re-Evaluation):   Vocational Rehabilitation: Provide vocational rehab assistance to qualifying candidates.   Vocational Rehab Evaluation & Intervention:     Vocational Rehab - 06/28/16 1322      Initial Vocational Rehab Evaluation & Intervention   Assessment shows need for Vocational Rehabilitation No       Education: Education Goals: Education classes will be provided on a weekly basis, covering required topics. Participant will state understanding/return demonstration of topics presented.  Learning Barriers/Preferences:     Learning Barriers/Preferences - 06/28/16 1320      Learning Barriers/Preferences   Learning Barriers None   Learning Preferences None      Education Topics: General Nutrition Guidelines/Fats and Fiber: -Group instruction provided by verbal, written material, models and posters to present the general guidelines for heart healthy nutrition. Gives an explanation and review of dietary fats and fiber.   Controlling Sodium/Reading Food Labels: -Group verbal and written material supporting the discussion of sodium use in heart healthy nutrition. Review and explanation with models, verbal and written materials for utilization of the food label.   Exercise Physiology & Risk Factors: - Group verbal and written instruction with models to review the exercise physiology of the cardiovascular system and associated critical values. Details cardiovascular disease risk factors and the goals associated with each risk factor.   Cardiac Rehab from 07/12/2016 in [facility] Cardiac and Pulmonary Rehab  Date  07/05/16  Educator  [facility]  Instruction Review Code  2- meets goals/outcomes      Aerobic Exercise & Resistance Training: - Gives group verbal and written discussion on the health impact of inactivity. On the components of aerobic and resistive training programs and the benefits of this training and how to safely progress through these programs.   Cardiac Rehab from 07/12/2016 in [facility] Cardiac and Pulmonary Rehab  Date  07/07/16  Educator  Roney Jaffe, EP  Instruction Review Code  2- meets goals/outcomes      Flexibility, Balance, General Exercise Guidelines: - Provides group verbal and written instruction on the benefits of flexibility and balance training programs. Provides  general exercise guidelines with specific guidelines to those with heart or lung disease. Demonstration and  skill practice provided.   Cardiac Rehab from 07/12/2016 in [facility] Cardiac and Pulmonary Rehab  Date  07/12/16  Educator  [facility]  Instruction Review Code  2- meets goals/outcomes      Stress Management: - Provides group verbal and written instruction about the health risks of elevated stress, cause of high stress, and healthy ways to reduce stress.   Depression: - Provides group verbal and written instruction on the correlation between heart/lung disease and depressed mood, treatment options, and the stigmas associated with seeking treatment.   Anatomy & Physiology of the Heart: - Group verbal and written instruction and models provide basic cardiac anatomy and physiology, with the coronary electrical and arterial systems. Review of: AMI, Angina, Valve disease, Heart Failure, Cardiac Arrhythmia, Pacemakers, and the ICD.   Cardiac Procedures: - Group verbal and written instruction and models to describe the testing methods done to diagnose heart disease. Reviews the outcomes of the test results. Describes the treatment choices: Medical Management, Angioplasty, or Coronary Bypass Surgery.   Cardiac Medications: - Group verbal and written instruction to review commonly prescribed medications for heart disease. Reviews the medication, class of the drug, and side effects. Includes the steps to properly store meds and maintain the prescription regimen.   Go Sex-Intimacy & Heart Disease, Get SMART - Goal Setting: - Group verbal and written instruction through game format to discuss heart disease and the return to sexual intimacy. Provides group verbal and written material to discuss and apply goal setting through the application of the S.M.A.R.T. Method.   Other Matters of the Heart: - Provides group verbal, written materials and models to describe Heart Failure, Angina, Valve Disease, and  Diabetes in the realm of heart disease. Includes description of the disease process and treatment options available to the cardiac patient.   Exercise & Equipment Safety: - Individual verbal instruction and demonstration of equipment use and safety with use of the equipment.   Cardiac Rehab from 07/12/2016 in [facility] Cardiac and Pulmonary Rehab  Date  06/28/16  Educator  Sb  Instruction Review Code  2- meets goals/outcomes      Infection Prevention: - Provides verbal and written material to individual with discussion of infection control including proper hand washing and proper equipment cleaning during exercise session.   Cardiac Rehab from 07/12/2016 in [facility] Cardiac and Pulmonary Rehab  Date  06/28/16  Educator  Sb  Instruction Review Code  2- meets goals/outcomes      Falls Prevention: - Provides verbal and written material to individual with discussion of falls prevention and safety.   Cardiac Rehab from 07/12/2016 in [facility] Cardiac and Pulmonary Rehab  Date  06/28/16  Educator  SB  Instruction Review Code  2- meets goals/outcomes      Diabetes: - Individual verbal and written instruction to review signs/symptoms of diabetes, desired ranges of glucose level fasting, after meals and with exercise. Advice that pre and post exercise glucose checks will be done for 3 sessions at entry of program.    Knowledge Questionnaire Score:     Knowledge Questionnaire Score - 06/28/16 1321      Knowledge Questionnaire Score   Pre Score 20/28  Reviewed correct responses with Tommy today. He verbalized understanding and had no questions today.      Core Components/Risk Factors/Patient Goals at Admission:     Personal Goals and Risk Factors at Admission - 06/28/16 1322      Core Components/Risk Factors/Patient Goals on Admission    Weight Management Yes;Obesity;Weight Loss  Intervention Weight Management: Develop a combined nutrition and exercise program designed to reach desired  caloric intake, while maintaining appropriate intake of nutrient and fiber, sodium and fats, and appropriate energy expenditure required for the weight goal.;Weight Management: Provide education and appropriate resources to help participant work on and attain dietary goals.;Weight Management/Obesity: Establish reasonable short term and long term weight goals.;Obesity: Provide education and appropriate resources to help participant work on and attain dietary goals.  Has lost 20 pounds since surgery   Admit Weight 204 lb 14.4 oz (92.9 kg)   Goal Weight: Short Term 200 lb (90.7 kg)   Goal Weight: Long Term 170 lb (77.1 kg)   Expected Outcomes Short Term: Continue to assess and modify interventions until short term weight is achieved;Long Term: Adherence to nutrition and physical activity/exercise program aimed toward attainment of established weight goal;Weight Loss: Understanding of general recommendations for a balanced deficit meal plan, which promotes 1-2 lb weight loss per week and includes a negative energy balance of [phone] kcal/d;Understanding recommendations for meals to include 15-35% energy as protein, 25-35% energy from fat, 35-60% energy from carbohydrates, less than '200mg'$  of dietary cholesterol, 20-35 gm of total fiber daily;Understanding of distribution of calorie intake throughout the day with the consumption of 4-5 meals/snacks   Hypertension Yes   Intervention Provide education on lifestyle modifcations including regular physical activity/exercise, weight management, moderate sodium restriction and increased consumption of fresh fruit, vegetables, and low fat dairy, alcohol moderation, and smoking cessation.;Monitor prescription use compliance.   Expected Outcomes Short Term: Continued assessment and intervention until BP is < 140/53m HG in hypertensive participants. < 130/842mHG in hypertensive participants with diabetes, heart failure or chronic kidney disease.;Long Term: Maintenance of  blood pressure at goal levels.   Lipids Yes   Intervention Provide education and support for participant on nutrition & aerobic/resistive exercise along with prescribed medications to achieve LDL '70mg'$ , HDL >'40mg'$ .   Expected Outcomes Short Term: Participant states understanding of desired cholesterol values and is compliant with medications prescribed. Participant is following exercise prescription and nutrition guidelines.;Long Term: Cholesterol controlled with medications as prescribed, with individualized exercise RX and with personalized nutrition plan. Value goals: LDL < '70mg'$ , HDL > 40 mg.   Stress Yes   Intervention Offer individual and/or small group education and counseling on adjustment to heart disease, stress management and health-related lifestyle change. Teach and support self-help strategies.;Refer participants experiencing significant psychosocial distress to appropriate mental health specialists for further evaluation and treatment. When possible, include family members and significant others in education/counseling sessions.   Expected Outcomes Short Term: Participant demonstrates changes in health-related behavior, relaxation and other stress management skills, ability to obtain effective social support, and compliance with psychotropic medications if prescribed.;Long Term: Emotional wellbeing is indicated by absence of clinically significant psychosocial distress or social isolation.      Core Components/Risk Factors/Patient Goals Review:    Core Components/Risk Factors/Patient Goals at Discharge (Final Review):    ITP Comments:     ITP Comments    Row Name 06/28/16 1307 07/05/16 1753 07/14/16 0933       ITP Comments Medical review completed today. ITP created. [facility]  First full day of exercise!  LeTruman Haywardas oriented to gym and equipment including functions, settings, policies, and procedures.  Alan Fernandez's individual exercise  prescription and treatment plan were reviewed.  All starting workloads were established based on the results of the 6 minute walk test done at initial orientation visit.  The plan for exercise progression was also introduced and progression will be customized based on patient's performance and goals. 30 day review. Continue with ITP unless directed changes per Medical Director review  New to program        Comments:

## 2016-07-15 DIAGNOSIS — I214 Non-ST elevation (NSTEMI) myocardial infarction: Secondary | ICD-10-CM

## 2016-07-15 DIAGNOSIS — Z951 Presence of aortocoronary bypass graft: Secondary | ICD-10-CM

## 2016-07-15 NOTE — Progress Notes (Signed)
Daily Session Note  Patient Details  Name: Alan Fernandez MRN: [id number] Date of Birth: 03-Jun-1955 Referring Provider:     Cardiac Rehab from 06/28/2016 in [facility] Cardiac and Pulmonary Rehab  Referring Provider  End, Harrell Gave MD      Encounter Date: 07/15/2016  Check In:     Session Check In - 07/15/16 1748      Check-In   Location [facility]-Cardiac & Pulmonary Rehab   Staff Present Gerlene Burdock, RN, Vickki Hearing, BA, ACSM CEP, Exercise Physiologist;Kelly Amedeo Plenty, BS, ACSM CEP, Exercise Physiologist   Supervising physician immediately available to respond to emergencies See telemetry face sheet for immediately available ER MD   Medication changes reported     No   Fall or balance concerns reported    No   Warm-up and Cool-down Performed on first and last piece of equipment   Resistance Training Performed Yes   VAD Patient? No     Pain Assessment   Currently in Pain? No/denies         History  Smoking Status  . Former Smoker  . Packs/day: 2.00  . Years: 34.00  . Quit date: 12/05/2004  Smokeless Tobacco  . Never Used    Comment: Quit in 2007    Goals Met:  Independence with exercise equipment Exercise tolerated well No report of cardiac concerns or symptoms Strength training completed today  Goals Unmet:  Not Applicable  Comments: Pt able to follow exercise prescription today without complaint.  Will continue to monitor for progression.    Dr. Emily Filbert is Medical Director for [place] and [facility].

## 2016-07-21 ENCOUNTER — Ambulatory Visit: Payer: 59 | Admitting: Internal Medicine

## 2016-07-21 DIAGNOSIS — I214 Non-ST elevation (NSTEMI) myocardial infarction: Secondary | ICD-10-CM | POA: Diagnosis not present

## 2016-07-21 DIAGNOSIS — Z951 Presence of aortocoronary bypass graft: Secondary | ICD-10-CM

## 2016-07-21 NOTE — Progress Notes (Deleted)
Follow-up Outpatient Visit Date: 07/21/2016  Primary Care Provider: Albina Billet, MD [address]   [place] [address]  Chief Complaint: Follow-up coronary artery disease  HPI:  Alan Fernandez is a 61 y.o. year-old male with history of ***, who presents for follow-up of ***.  --------------------------------------------------------------------------------------------------  Cardiovascular History & Procedures: Cardiovascular Problems:  ***  Risk Factors:  ***  Cath/PCI:  ***  CV Surgery:  ***  EP Procedures and Devices:  ***  Non-Invasive Evaluation(s):  ***  Recent CV Pertinent Labs: Lab Results  Component Value Date   CHOL 100 07/02/2016   HDL 28 (L) 07/02/2016   LDLCALC 52 07/02/2016   TRIG 100 07/02/2016   CHOLHDL 3.6 07/02/2016   INR 1.37 05/05/2016   K 3.9 05/07/2016   MG 2.3 05/06/2016   BUN 10 05/07/2016   BUN 10 06/26/2014   CREATININE 0.97 05/07/2016    Past medical and surgical history were reviewed and updated in EPIC.  Outpatient Encounter Prescriptions as of 07/21/2016  Medication Sig  . aspirin EC 325 MG EC tablet Take 1 tablet (325 mg total) by mouth daily.  Marland Kitchen atorvastatin (LIPITOR) 40 MG tablet Take 1 tablet (40 mg total) by mouth daily at 6 PM.  . clobetasol (TEMOVATE) 0.05 % external solution Apply 1 application topically as needed. Rash  . lisinopril (PRINIVIL,ZESTRIL) 2.5 MG tablet Take 1 tablet (2.5 mg total) by mouth daily.  . metoprolol tartrate (LOPRESSOR) 25 MG tablet Take 1 tablet (25 mg total) by mouth 2 (two) times daily.  . montelukast (SINGULAIR) 10 MG tablet Take 1 tablet by mouth daily. At bedtime.  . VENTOLIN HFA 108 (90 BASE) MCG/ACT inhaler Inhale 2 puffs into the lungs every 4 (four) hours as needed.    No facility-administered encounter medications on file as of 07/21/2016.     Allergies: Contrast media [iodinated diagnostic agents] and Adhesive [tape]  Social History   Social History  . Marital  status: Married    Spouse name: N/A  . Number of children: N/A  . Years of education: N/A   Occupational History  . Not on file.   Social History Main Topics  . Smoking status: Former Smoker    Packs/day: 2.00    Years: 34.00    Quit date: 12/05/2004  . Smokeless tobacco: Never Used     Comment: Quit in 2007  . Alcohol use 4.8 oz/week    8 Cans of beer per week     Comment: 4-5 beers per week  . Drug use: No  . Sexual activity: Not on file   Other Topics Concern  . Not on file   Social History Narrative  . No narrative on file    Family History  Problem Relation Age of Onset  . Hypertension Mother   . CAD Father        a. father with MI at 53     Review of Systems: A 12-system review of systems was performed and was negative except as noted in the HPI.  --------------------------------------------------------------------------------------------------  Physical Exam: There were no vitals taken for this visit.  General:  *** HEENT: No conjunctival pallor or scleral icterus.  Moist mucous membranes.  OP clear. Neck: Supple without lymphadenopathy, thyromegaly, JVD, or HJR.  No carotid bruit. Lungs: Normal work of breathing.  Clear to auscultation bilaterally without wheezes or crackles. Heart: Regular rate and rhythm without murmurs, rubs, or gallops.  Non-displaced PMI. Abd: Bowel sounds present.  Soft, NT/ND  without hepatosplenomegaly Ext: No lower extremity edema.  Radial, PT, and DP pulses are 2+ bilaterally. Skin: warm and dry without rash  EKG:  ***  Lab Results  Component Value Date   WBC 7.4 05/07/2016   HGB 10.9 (L) 05/07/2016   HCT 30.9 (L) 05/07/2016   MCV 90.4 05/07/2016   PLT 122 (L) 05/07/2016    Lab Results  Component Value Date   NA 131 (L) 05/07/2016   K 3.9 05/07/2016   CL 97 (L) 05/07/2016   CO2 26 05/07/2016   BUN 10 05/07/2016   CREATININE 0.97 05/07/2016   GLUCOSE 152 (H) 05/07/2016   ALT 23 07/02/2016    Lab Results    Component Value Date   CHOL 100 07/02/2016   HDL 28 (L) 07/02/2016   LDLCALC 52 07/02/2016   TRIG 100 07/02/2016   CHOLHDL 3.6 07/02/2016    --------------------------------------------------------------------------------------------------  ASSESSMENT AND PLAN: Alan Gave Nelly Scriven, MD 07/21/2016 1:30 PM

## 2016-07-21 NOTE — Progress Notes (Signed)
Daily Session Note  Patient Details  Name: Alan Fernandez MRN: [id number] Date of Birth: [birth year] Referring Provider:     Cardiac Rehab from 06/28/2016 in [facility] Cardiac and Pulmonary Rehab  Referring Provider  End, Harrell Gave MD      Encounter Date: 07/21/2016  Check In:     Session Check In - 07/21/16 1649      Check-In   Location [facility]-Cardiac & Pulmonary Rehab   Staff Present Renita Papa, RN BSN;Carroll Enterkin, RN, Vickki Hearing, BA, ACSM CEP, Exercise Physiologist   Supervising physician immediately available to respond to emergencies See telemetry face sheet for immediately available ER MD   Medication changes reported     No   Fall or balance concerns reported    No   Warm-up and Cool-down Performed on first and last piece of equipment   Resistance Training Performed Yes   VAD Patient? No     Pain Assessment   Currently in Pain? No/denies         History  Smoking Status  . Former Smoker  . Packs/day: 2.00  . Years: 34.00  . Quit date: 12/05/2004  Smokeless Tobacco  . Never Used    Comment: Quit in 2007    Goals Met:  Independence with exercise equipment Exercise tolerated well No report of cardiac concerns or symptoms Strength training completed today  Goals Unmet:  Not Applicable  Comments:  Pt able to follow exercise prescription today without complaint.  Will continue to monitor for progression.     Dr. Emily Filbert is Medical Director for [place] and [facility].

## 2016-07-22 ENCOUNTER — Encounter: Payer: 59 | Admitting: *Deleted

## 2016-07-22 DIAGNOSIS — I214 Non-ST elevation (NSTEMI) myocardial infarction: Secondary | ICD-10-CM | POA: Diagnosis not present

## 2016-07-22 DIAGNOSIS — Z951 Presence of aortocoronary bypass graft: Secondary | ICD-10-CM

## 2016-07-22 NOTE — Progress Notes (Signed)
Daily Session Note  Patient Details  Name: Alan Fernandez MRN: [id number] Date of Birth: Jun 20, 1955 Referring Provider:     Cardiac Rehab from 06/28/2016 in [facility] Cardiac and Pulmonary Rehab  Referring Provider  End, Harrell Gave MD      Encounter Date: 07/22/2016  Check In:     Session Check In - 07/22/16 1720      Check-In   Staff Present Heath Lark, RN, BSN, CCRP;Carroll Enterkin, RN, Vickki Hearing, BA, ACSM CEP, Exercise Physiologist   Supervising physician immediately available to respond to emergencies See telemetry face sheet for immediately available ER MD   Medication changes reported     No   Fall or balance concerns reported    No   Warm-up and Cool-down Performed on first and last piece of equipment   Resistance Training Performed Yes   VAD Patient? No     Pain Assessment   Currently in Pain? No/denies           Exercise Prescription Changes - 07/22/16 1100      Response to Exercise   Blood Pressure (Admit) 110/60   Blood Pressure (Exercise) 142/82   Blood Pressure (Exit) 116/64   Heart Rate (Admit) 58 bpm   Heart Rate (Exercise) 88 bpm   Heart Rate (Exit) 63 bpm   Rating of Perceived Exertion (Exercise) 13     Progression   Average METs 3.25     Resistance Training   Training Prescription Yes   Weight 4   Reps 10-15     Treadmill   MPH 2.5   Grade 0.5   Minutes 15   METs 3.09     NuStep   Level 4   SPM 80   Minutes 15   METs 3.5      History  Smoking Status  . Former Smoker  . Packs/day: 2.00  . Years: 34.00  . Quit date: 12/05/2004  Smokeless Tobacco  . Never Used    Comment: Quit in 2007    Goals Met:  Exercise tolerated well Personal goals reviewed No report of cardiac concerns or symptoms Strength training completed today  Goals Unmet:  Not Applicable  Comments: Home exercise reviewed today.   Doing well with exercise prescription progression.    Dr. Emily Filbert is Medical Director for [place] and [facility].

## 2016-07-26 ENCOUNTER — Encounter: Payer: 59 | Attending: Internal Medicine

## 2016-07-26 DIAGNOSIS — I214 Non-ST elevation (NSTEMI) myocardial infarction: Secondary | ICD-10-CM | POA: Insufficient documentation

## 2016-07-26 DIAGNOSIS — Z951 Presence of aortocoronary bypass graft: Secondary | ICD-10-CM | POA: Insufficient documentation

## 2016-08-02 ENCOUNTER — Encounter: Payer: 59 | Admitting: *Deleted

## 2016-08-02 DIAGNOSIS — I214 Non-ST elevation (NSTEMI) myocardial infarction: Secondary | ICD-10-CM

## 2016-08-02 DIAGNOSIS — Z951 Presence of aortocoronary bypass graft: Secondary | ICD-10-CM

## 2016-08-02 NOTE — Progress Notes (Signed)
Daily Session Note  Patient Details  Name: Alan Fernandez MRN: [id number] Date of Birth: [birth year] Referring Provider:     Cardiac Rehab from 06/28/2016 in [facility] Cardiac and Pulmonary Rehab  Referring Provider  End, Harrell Gave MD      Encounter Date: 08/02/2016  Check In:     Session Check In - 08/02/16 1747      Check-In   Location [facility]-Cardiac & Pulmonary Rehab   Staff Present Heath Lark, RN, BSN, CCRP;Meredith Sherryll Burger, RN Moises Blood, BS, ACSM CEP, Exercise Physiologist   Supervising physician immediately available to respond to emergencies See telemetry face sheet for immediately available ER MD   Medication changes reported     No   Fall or balance concerns reported    No   Warm-up and Cool-down Performed on first and last piece of equipment   Resistance Training Performed Yes   VAD Patient? No     Pain Assessment   Currently in Pain? No/denies   Multiple Pain Sites No         History  Smoking Status  . Former Smoker  . Packs/day: 2.00  . Years: 34.00  . Quit date: 12/05/2004  Smokeless Tobacco  . Never Used    Comment: Quit in 2007    Goals Met:  Independence with exercise equipment Exercise tolerated well No report of cardiac concerns or symptoms Strength training completed today  Personal goals reviewed  Goals Unmet:  Not Applicable  Comments: Pt able to follow exercise prescription today without complaint.  Will continue to monitor for progression.    Dr. Emily Filbert is Medical Director for [place] and [facility].

## 2016-08-04 ENCOUNTER — Encounter: Payer: 59 | Admitting: *Deleted

## 2016-08-04 DIAGNOSIS — I214 Non-ST elevation (NSTEMI) myocardial infarction: Secondary | ICD-10-CM | POA: Diagnosis not present

## 2016-08-04 DIAGNOSIS — Z951 Presence of aortocoronary bypass graft: Secondary | ICD-10-CM

## 2016-08-04 NOTE — Progress Notes (Signed)
Daily Session Note  Patient Details  Name: MUHAMMAD VACCA MRN: [id number] Date of Birth: 1955/11/26 Referring Provider:     Cardiac Rehab from 06/28/2016 in [facility] Cardiac and Pulmonary Rehab  Referring Provider  End, Harrell Gave MD      Encounter Date: 08/04/2016  Check In:     Session Check In - 08/04/16 1809      Check-In   Location [facility]-Cardiac & Pulmonary Rehab   Staff Present Gerlene Burdock, RN, Vickki Hearing, BA, ACSM CEP, Exercise Physiologist;Meredith Sherryll Burger, RN BSN   Supervising physician immediately available to respond to emergencies See telemetry face sheet for immediately available ER MD   Medication changes reported     No   Fall or balance concerns reported    No   Tobacco Cessation No Change   Warm-up and Cool-down Performed on first and last piece of equipment   Resistance Training Performed Yes   VAD Patient? No     Pain Assessment   Currently in Pain? No/denies         History  Smoking Status  . Former Smoker  . Packs/day: 2.00  . Years: 34.00  . Quit date: 12/05/2004  Smokeless Tobacco  . Never Used    Comment: Quit in 2007    Goals Met:  Proper associated with RPD/PD & O2 Sat Exercise tolerated well No report of cardiac concerns or symptoms  Goals Unmet:  Not Applicable  Comments:     Dr. Emily Filbert is Medical Director for [place] and [facility].

## 2016-08-05 DIAGNOSIS — Z951 Presence of aortocoronary bypass graft: Secondary | ICD-10-CM

## 2016-08-05 DIAGNOSIS — I214 Non-ST elevation (NSTEMI) myocardial infarction: Secondary | ICD-10-CM | POA: Diagnosis not present

## 2016-08-05 NOTE — Progress Notes (Signed)
Daily Session Note  Patient Details  Name: Alan Fernandez MRN: [id number] Date of Birth: May 08, 1955 Referring Provider:     Cardiac Rehab from 06/28/2016 in [facility] Cardiac and Pulmonary Rehab  Referring Provider  End, Harrell Gave MD      Encounter Date: 08/05/2016  Check In:     Session Check In - 08/05/16 1703      Check-In   Location [facility]-Cardiac & Pulmonary Rehab   Staff Present Gerlene Burdock, RN, Vickki Hearing, BA, ACSM CEP, Exercise Physiologist;Kelly Amedeo Plenty, BS, ACSM CEP, Exercise Physiologist;Joseph Flavia Shipper   Supervising physician immediately available to respond to emergencies See telemetry face sheet for immediately available ER MD   Medication changes reported     No   Fall or balance concerns reported    No   Warm-up and Cool-down Performed on first and last piece of equipment   Resistance Training Performed Yes   VAD Patient? No     Pain Assessment   Currently in Pain? No/denies           Exercise Prescription Changes - 08/05/16 1500      Response to Exercise   Blood Pressure (Admit) 110/56   Blood Pressure (Exercise) 114/58   Heart Rate (Admit) 63 bpm   Heart Rate (Exercise) 94 bpm   Heart Rate (Exit) 66 bpm   Rating of Perceived Exertion (Exercise) 12     Resistance Training   Training Prescription Yes   Weight 4   Reps 10-15     Treadmill   MPH 3   Grade 0.5   Minutes 15   METs 3.5     NuStep   Level 5   SPM 80   Minutes 15   METs 3.8      History  Smoking Status  . Former Smoker  . Packs/day: 2.00  . Years: 34.00  . Quit date: 12/05/2004  Smokeless Tobacco  . Never Used    Comment: Quit in 2007    Goals Met:  Independence with exercise equipment Exercise tolerated well No report of cardiac concerns or symptoms Strength training completed today  Goals Unmet:  Not Applicable  Comments: Pt able to follow exercise prescription today without complaint.  Will continue to monitor for progression.    Dr. Emily Filbert is Medical Director for [place] and [facility].

## 2016-08-09 ENCOUNTER — Encounter: Payer: 59 | Admitting: *Deleted

## 2016-08-09 DIAGNOSIS — I214 Non-ST elevation (NSTEMI) myocardial infarction: Secondary | ICD-10-CM | POA: Diagnosis not present

## 2016-08-09 DIAGNOSIS — Z951 Presence of aortocoronary bypass graft: Secondary | ICD-10-CM

## 2016-08-09 NOTE — Progress Notes (Signed)
Daily Session Note  Patient Details  Name: Alan Fernandez MRN: [id number] Date of Birth: 1955/05/06 Referring Provider:     Cardiac Rehab from 06/28/2016 in [facility] Cardiac and Pulmonary Rehab  Referring Provider  End, Harrell Gave MD      Encounter Date: 08/09/2016  Check In:     Session Check In - 08/09/16 1713      Check-In   Staff Present Heath Lark, RN, BSN, CCRP;Meredith Sherryll Burger, RN Moises Blood, BS, ACSM CEP, Exercise Physiologist   Supervising physician immediately available to respond to emergencies See telemetry face sheet for immediately available ER MD   Medication changes reported     No   Fall or balance concerns reported    No   Warm-up and Cool-down Performed on first and last piece of equipment   Resistance Training Performed Yes   VAD Patient? No     Pain Assessment   Currently in Pain? No/denies         History  Smoking Status  . Former Smoker  . Packs/day: 2.00  . Years: 34.00  . Quit date: 12/05/2004  Smokeless Tobacco  . Never Used    Comment: Quit in 2007    Goals Met:  Exercise tolerated well No report of cardiac concerns or symptoms Strength training completed today  Goals Unmet:  Not Applicable  Comments: Doing well with exercise prescription progression.    Dr. Emily Filbert is Medical Director for [place] and [facility].

## 2016-08-11 ENCOUNTER — Encounter: Payer: 59 | Admitting: *Deleted

## 2016-08-11 ENCOUNTER — Encounter: Payer: Self-pay | Admitting: *Deleted

## 2016-08-11 DIAGNOSIS — I214 Non-ST elevation (NSTEMI) myocardial infarction: Secondary | ICD-10-CM

## 2016-08-11 DIAGNOSIS — Z951 Presence of aortocoronary bypass graft: Secondary | ICD-10-CM

## 2016-08-11 NOTE — Progress Notes (Signed)
Cardiac Individual Treatment Plan  Patient Details  Name: Alan Fernandez MRN: [id number] Date of Birth: May 14, 1955 Referring Provider:     Cardiac Rehab from 06/28/2016 in [facility] Cardiac and Pulmonary Rehab  Referring Provider  End, Harrell Gave MD      Initial Encounter Date:    Cardiac Rehab from 06/28/2016 in [facility] Cardiac and Pulmonary Rehab  Date  06/28/16  Referring Provider  End, Harrell Gave MD      Visit Diagnosis: S/P CABG x 4  NSTEMI (non-ST elevated myocardial infarction) [facility])  Patient's Home Medications on Admission:  Current Outpatient Prescriptions:  .  aspirin EC 325 MG EC tablet, Take 1 tablet (325 mg total) by mouth daily., Disp: 30 tablet, Rfl: 0 .  atorvastatin (LIPITOR) 40 MG tablet, Take 1 tablet (40 mg total) by mouth daily at 6 PM., Disp: 90 tablet, Rfl: 2 .  clobetasol (TEMOVATE) 0.05 % external solution, Apply 1 application topically as needed. Rash, Disp: , Rfl:  .  lisinopril (PRINIVIL,ZESTRIL) 2.5 MG tablet, Take 1 tablet (2.5 mg total) by mouth daily., Disp: 90 tablet, Rfl: 0 .  metoprolol tartrate (LOPRESSOR) 25 MG tablet, Take 1 tablet (25 mg total) by mouth 2 (two) times daily., Disp: 180 tablet, Rfl: 0 .  montelukast (SINGULAIR) 10 MG tablet, Take 1 tablet by mouth daily. At bedtime., Disp: , Rfl:  .  VENTOLIN HFA 108 (90 BASE) MCG/ACT inhaler, Inhale 2 puffs into the lungs every 4 (four) hours as needed. , Disp: , Rfl:   Past Medical History: Past Medical History:  Diagnosis Date  . Asthma   . CAD (coronary artery disease)    a. 04/2016 NSTEMI/Cath: LM 20/90, LAD 40p, 17m LCX 60ost, 723mRCA 40p, 3039mPDA 70, EF 55-65%;  b. 04/2016 s/p CABG x 2: LIMA->LAD, VG->RPL, VG->OM1->OM3.  . Diastolic dysfunction    a. 04/2016 Echo: EF 60-65%, grade 1 DD, mildly to mod dil RA.  . EMarland Kitchenzema   . Edema extremities   . GERD (gastroesophageal reflux disease)   . Hemorrhoid   . Pneumonia Feb 2016    Tobacco Use: History  Smoking Status  . Former Smoker  .  Packs/day: 2.00  . Years: 34.00  . Quit date: 12/05/2004  Smokeless Tobacco  . Never Used    Comment: Quit in 2007    Labs: Recent Review Flowsheet Data    Labs for ITP Cardiac and Pulmonary Rehab Latest Ref Rng & Units 05/05/2016 05/05/2016 05/05/2016 05/06/2016 07/02/2016   Cholestrol 0 - 200 mg/dL - - - - 100   LDLCALC 0 - 99 mg/dL - - - - 52   HDL >40 mg/dL - - - - 28(L)   Trlycerides <150 mg/dL - - - - 100   Hemoglobin A1c 4.8 - 5.6 % - - - - -   PHART 7.350 - 7.450 7.345(L) 7.332(L) - - -   PCO2ART 32.0 - 48.0 mmHg 44.9 42.1 - - -   HCO3 20.0 - 28.0 mmol/L 24.8 22.5 - - -   TCO2 0 - 100 mmol/L _0 -   ACIDBASEDEF 0.0 - 2.0 mmol/L 1.0 3.0(H) - - -   O2SAT % 96.0 92.0 - - -       Exercise Target Goals:    Exercise Program Goal: Individual exercise prescription set with THRR, safety & activity barriers. Participant demonstrates ability to understand and report RPE using BORG scale, to self-measure pulse accurately, and to acknowledge the importance of the exercise prescription.  Exercise Prescription  Goal: Starting with aerobic activity 30 plus minutes a day, 3 days per week for initial exercise prescription. Provide home exercise prescription and guidelines that participant acknowledges understanding prior to discharge.  Activity Barriers & Risk Stratification:     Activity Barriers & Cardiac Risk Stratification - 06/28/16 1323      Activity Barriers & Cardiac Risk Stratification   Activity Barriers None  Left hip will hurt some with walking,resolves when he rests. Advised he talk to his MD about this symptom.    Cardiac Risk Stratification High      6 Minute Walk:     6 Minute Walk    Row Name 06/28/16 1526         6 Minute Walk   Phase Initial     Distance 1425 feet     Walk Time 6 minutes     # of Rest Breaks 0     MPH 2     METS 3.42     RPE 11     VO2 Peak 11.99     Symptoms No     Resting HR 58 bpm     Resting BP 118/62     Max Ex. HR 81  bpm     Max Ex. BP 142/60        Oxygen Initial Assessment:   Oxygen Re-Evaluation:   Oxygen Discharge (Final Oxygen Re-Evaluation):   Initial Exercise Prescription:     Initial Exercise Prescription - 06/28/16 1500      Date of Initial Exercise RX and Referring Provider   Date 06/28/16   Referring Provider End, Harrell Gave MD     Treadmill   MPH 2.5   Grade 0.5   Minutes 15   METs 3.09     NuStep   Level 3   SPM 80   Minutes 15   METs 2     Recumbant Elliptical   Level 2   RPM 50   Minutes 15   METs 2     Prescription Details   Frequency (times per week) 3   Duration Progress to 45 minutes of aerobic exercise without signs/symptoms of physical distress     Intensity   THRR 40-80% of Max Heartrate 98-139   Ratings of Perceived Exertion 11-13   Perceived Dyspnea 0-4     Progression   Progression Continue to progress workloads to maintain intensity without signs/symptoms of physical distress.     Resistance Training   Training Prescription Yes   Weight 4 lbs   Reps 10-15      Perform Capillary Blood Glucose checks as needed.  Exercise Prescription Changes:     Exercise Prescription Changes    Row Name 06/28/16 1500 07/07/16 1400 07/22/16 1100 08/05/16 1500       Response to Exercise   Blood Pressure (Admit) 118/62 104/62 110/60 110/56    Blood Pressure (Exercise) 142/60 144/72 142/82 114/58    Blood Pressure (Exit)  - 100/60 116/64  -    Heart Rate (Admit) 58 bpm 69 bpm 58 bpm 63 bpm    Heart Rate (Exercise) 81 bpm 92 bpm 88 bpm 94 bpm    Heart Rate (Exit)  - 72 bpm 63 bpm 66 bpm    Oxygen Saturation (Admit) 97 %  -  -  -    Rating of Perceived Exertion (Exercise) 11 12 13 12     Symptoms none  -  -  -    Comments walk test results  -  -  -  Progression   Average METs  -  - 3.25  -      Resistance Training   Training Prescription  - No Yes Yes    Weight  - 4 4 4     Reps  - 10-15 10-15 10-15      Treadmill   MPH  - 2.5 2.5 3     Grade  - 0.5 0.5 0.5    Minutes  - 15 15 15     METs  - 3.09 3.09 3.5      NuStep   Level  -  - 4 5    SPM  -  - 80 80    Minutes  -  - 15 15    METs  -  - 3.5 3.8       Exercise Comments:     Exercise Comments    Row Name 07/05/16 1753 07/07/16 1423 07/22/16 1158 08/05/16 1526     Exercise Comments  First full day of exercise!  Alan Fernandez was oriented to gym and equipment including functions, settings, policies, and procedures.  Alan Fernandez's individual exercise prescription and treatment plan were reviewed.  All starting workloads were established based on the results of the 6 minute walk test done at initial orientation visit.  The plan for exercise progression was also introduced and progression will be customized based on patient's performance and goals. Alan Fernandez completed first full day of exercise with no issues.  Staff reviewed safyet, THR and RPE. Alan Fernandez is tolerating exercise well and has not had any trouble with strength or cardio. Alan Fernandez is progressing well with exrecise.  Staff will continue to monitor.  He has switched to upright ellipitcal instead of recumbent.       Exercise Goals and Review:     Exercise Goals    Row Name 06/28/16 1530             Exercise Goals   Increase Physical Activity Yes       Intervention Provide advice, education, support and counseling about physical activity/exercise needs.;Develop an individualized exercise prescription for aerobic and resistive training based on initial evaluation findings, risk stratification, comorbidities and participant's personal goals.       Expected Outcomes Achievement of increased cardiorespiratory fitness and enhanced flexibility, muscular endurance and strength shown through measurements of functional capacity and personal statement of participant.       Increase Strength and Stamina Yes       Intervention Provide advice, education, support and counseling about physical activity/exercise needs.;Develop an individualized  exercise prescription for aerobic and resistive training based on initial evaluation findings, risk stratification, comorbidities and participant's personal goals.       Expected Outcomes Achievement of increased cardiorespiratory fitness and enhanced flexibility, muscular endurance and strength shown through measurements of functional capacity and personal statement of participant.          Exercise Goals Re-Evaluation :   Discharge Exercise Prescription (Final Exercise Prescription Changes):     Exercise Prescription Changes - 08/05/16 1500      Response to Exercise   Blood Pressure (Admit) 110/56   Blood Pressure (Exercise) 114/58   Heart Rate (Admit) 63 bpm   Heart Rate (Exercise) 94 bpm   Heart Rate (Exit) 66 bpm   Rating of Perceived Exertion (Exercise) 12     Resistance Training   Training Prescription Yes   Weight 4   Reps 10-15     Treadmill   MPH 3   Grade 0.5   Minutes  15   METs 3.5     NuStep   Level 5   SPM 80   Minutes 15   METs 3.8      Nutrition:  Target Goals: Understanding of nutrition guidelines, daily intake of sodium 1500mg , cholesterol 200mg , calories 30% from fat and 7% or less from saturated fats, daily to have 5 or more servings of fruits and vegetables.  Biometrics:     Pre Biometrics - 06/28/16 1530      Pre Biometrics   Height 5\' 10"  (1.778 m)   Weight 203 lb 14.4 oz (92.5 kg)   Waist Circumference 44.25 inches   Hip Circumference 42.5 inches   Waist to Hip Ratio 1.04 %   BMI (Calculated) 29.3   Single Leg Stand 30 seconds       Nutrition Therapy Plan and Nutrition Goals:     Nutrition Therapy & Goals - 08/02/16 1659      Nutrition Therapy   RD appointment defered --  Would like appointment with RD      Nutrition Discharge: Rate Your Plate Scores:     Nutrition Assessments - 06/28/16 1311      MEDFICTS Scores   Pre Score 3      Nutrition Goals Re-Evaluation:   Nutrition Goals Discharge (Final Nutrition  Goals Re-Evaluation):   Psychosocial: Target Goals: Acknowledge presence or absence of significant depression and/or stress, maximize coping skills, provide positive support system. Participant is able to verbalize types and ability to use techniques and skills needed for reducing stress and depression.   Initial Review & Psychosocial Screening:     Initial Psych Review & Screening - 06/28/16 1317      Initial Review   Current issues with None Identified  Is concerned about overdoing it after having open heart surgery. Is 8 weeks out form surgery and is noticing incerased ability to perform daily activities.      Family Dynamics   Good Support System? Yes     Barriers   Psychosocial barriers to participate in program There are no identifiable barriers or psychosocial needs.;The patient should benefit from training in stress management and relaxation.     Screening Interventions   Interventions Encouraged to exercise;Provide feedback about the scores to participant;To provide support and resources with identified psychosocial needs  Work stress is only identified stress.       Quality of Life Scores:      Quality of Life - 06/28/16 1319      Quality of Life Scores   Health/Function Pre 21.6 %   Socioeconomic Pre 26.57 %   Psych/Spiritual Pre 26.57 %   Family Pre 25.2 %   GLOBAL Pre 24.18 %      PHQ-9: Recent Review Flowsheet Data    Depression screen [facility] 2/9 06/28/2016   Decreased Interest 0   Down, Depressed, Hopeless 0   PHQ - 2 Score 0   Altered sleeping 1   Tired, decreased energy 1   Change in appetite 0   Feeling bad or failure about yourself  0   Trouble concentrating 0   Moving slowly or fidgety/restless 0   Suicidal thoughts 0   PHQ-9 Score 2   Difficult doing work/chores Not difficult at all     Interpretation of Total Score  Total Score Depression Severity:  1-4 = Minimal depression, 5-9 = Mild depression, 10-14 = Moderate depression, 15-19 =  Moderately severe depression, 20-27 = Severe depression   Psychosocial Evaluation and Intervention:  Psychosocial Evaluation - 08/02/16 1657      Psychosocial Evaluation & Interventions   Expected Outcomes Alan Fernandez will exercise consistently to achieve his stated goals.  He will benefit from the psychoeducational components of this program to learn positive ways to cope with stress in his life.  He will also benefit from meeting with the dietician to address his weight loss goals.        Psychosocial Re-Evaluation:     Psychosocial Re-Evaluation    [place] Name 08/02/16 1640 08/02/16 1656           Psychosocial Re-Evaluation   Current issues with None Identified  -      Comments  - Alan Fernandez continues without significant stress.HAS seen great increase in exercise levels since started.  He continued to exerciselast week while on vacation      Expected Outcomes Alan Fernandez will exercise consistently to achieve his stated goals.  He will benefit from the psychoeducational components of this program to learn positive ways to cope with stress in his life.  He will also benefit from meeting with the dietician to address his weight loss goals.    -      Interventions Stress management education;Encouraged to attend Cardiac Rehabilitation for the exercise;Relaxation education  -      Continue Psychosocial Services  Follow up required by staff  -         Psychosocial Discharge (Final Psychosocial Re-Evaluation):     Psychosocial Re-Evaluation - 08/02/16 1656      Psychosocial Re-Evaluation   Comments Alan Fernandez continues without significant stress.HAS seen great increase in exercise levels since started.  He continued to exerciselast week while on vacation      Vocational Rehabilitation: Provide vocational rehab assistance to qualifying candidates.   Vocational Rehab Evaluation & Intervention:     Vocational Rehab - 06/28/16 1322      Initial Vocational Rehab Evaluation & Intervention    Assessment shows need for Vocational Rehabilitation No      Education: Education Goals: Education classes will be provided on a weekly basis, covering required topics. Participant will state understanding/return demonstration of topics presented.  Learning Barriers/Preferences:     Learning Barriers/Preferences - 06/28/16 1320      Learning Barriers/Preferences   Learning Barriers None   Learning Preferences None      Education Topics: General Nutrition Guidelines/Fats and Fiber: -Group instruction provided by verbal, written material, models and posters to present the general guidelines for heart healthy nutrition. Gives an explanation and review of dietary fats and fiber.   Controlling Sodium/Reading Food Labels: -Group verbal and written material supporting the discussion of sodium use in heart healthy nutrition. Review and explanation with models, verbal and written materials for utilization of the food label.   Exercise Physiology & Risk Factors: - Group verbal and written instruction with models to review the exercise physiology of the cardiovascular system and associated critical values. Details cardiovascular disease risk factors and the goals associated with each risk factor.   Cardiac Rehab from 08/09/2016 in [facility] Cardiac and Pulmonary Rehab  Date  07/05/16  Educator  [facility]  Instruction Review Code  2- meets goals/outcomes      Aerobic Exercise & Resistance Training: - Gives group verbal and written discussion on the health impact of inactivity. On the components of aerobic and resistive training programs and the benefits of this training and how to safely progress through these programs.   Cardiac Rehab from 08/09/2016 in [facility] Cardiac and Pulmonary Rehab  Date  07/07/16  Educator  Nada Maclachlan, EP  Instruction Review Code  2- meets goals/outcomes      Flexibility, Balance, General Exercise Guidelines: - Provides group verbal and written instruction on the benefits  of flexibility and balance training programs. Provides general exercise guidelines with specific guidelines to those with heart or lung disease. Demonstration and skill practice provided.   Cardiac Rehab from 08/09/2016 in [facility] Cardiac and Pulmonary Rehab  Date  07/12/16  Educator  [facility]  Instruction Review Code  2- meets goals/outcomes      Stress Management: - Provides group verbal and written instruction about the health risks of elevated stress, cause of high stress, and healthy ways to reduce stress.   Cardiac Rehab from 08/09/2016 in [facility] Cardiac and Pulmonary Rehab  Date  07/21/16  Educator  [facility]  Instruction Review Code  2- meets goals/outcomes      Depression: - Provides group verbal and written instruction on the correlation between heart/lung disease and depressed mood, treatment options, and the stigmas associated with seeking treatment.   Anatomy & Physiology of the Heart: - Group verbal and written instruction and models provide basic cardiac anatomy and physiology, with the coronary electrical and arterial systems. Review of: AMI, Angina, Valve disease, Heart Failure, Cardiac Arrhythmia, Pacemakers, and the ICD.   Cardiac Procedures: - Group verbal and written instruction and models to describe the testing methods done to diagnose heart disease. Reviews the outcomes of the test results. Describes the treatment choices: Medical Management, Angioplasty, or Coronary Bypass Surgery.   Cardiac Medications: - Group verbal and written instruction to review commonly prescribed medications for heart disease. Reviews the medication, class of the drug, and side effects. Includes the steps to properly store meds and maintain the prescription regimen.   Cardiac Rehab from 08/09/2016 in [facility] Cardiac and Pulmonary Rehab  Date  08/04/16 Marisue Humble 1]  Educator  CE [Part 2 meds]  Instruction Review Code  2- meets goals/outcomes      Go Sex-Intimacy & Heart Disease, Get SMART - Goal Setting: -  Group verbal and written instruction through game format to discuss heart disease and the return to sexual intimacy. Provides group verbal and written material to discuss and apply goal setting through the application of the S.M.A.R.T. Method.   Other Matters of the Heart: - Provides group verbal, written materials and models to describe Heart Failure, Angina, Valve Disease, and Diabetes in the realm of heart disease. Includes description of the disease process and treatment options available to the cardiac patient.   Exercise & Equipment Safety: - Individual verbal instruction and demonstration of equipment use and safety with use of the equipment.   Cardiac Rehab from 08/09/2016 in [facility] Cardiac and Pulmonary Rehab  Date  06/28/16  Educator  Sb  Instruction Review Code  2- meets goals/outcomes      Infection Prevention: - Provides verbal and written material to individual with discussion of infection control including proper hand washing and proper equipment cleaning during exercise session.   Cardiac Rehab from 08/09/2016 in [facility] Cardiac and Pulmonary Rehab  Date  06/28/16  Educator  Sb  Instruction Review Code  2- meets goals/outcomes      Falls Prevention: - Provides verbal and written material to individual with discussion of falls prevention and safety.   Cardiac Rehab from 08/09/2016 in [facility] Cardiac and Pulmonary Rehab  Date  06/28/16  Educator  SB  Instruction Review Code  2- meets goals/outcomes      Diabetes: -  Individual verbal and written instruction to review signs/symptoms of diabetes, desired ranges of glucose level fasting, after meals and with exercise. Advice that pre and post exercise glucose checks will be done for 3 sessions at entry of program.    Knowledge Questionnaire Score:     Knowledge Questionnaire Score - 06/28/16 1321      Knowledge Questionnaire Score   Pre Score 20/28  Reviewed correct responses with Alan Fernandez today. He verbalized understanding  and had no questions today.      Core Components/Risk Factors/Patient Goals at Admission:     Personal Goals and Risk Factors at Admission - 06/28/16 1322      Core Components/Risk Factors/Patient Goals on Admission    Weight Management Yes;Obesity;Weight Loss   Intervention Weight Management: Develop a combined nutrition and exercise program designed to reach desired caloric intake, while maintaining appropriate intake of nutrient and fiber, sodium and fats, and appropriate energy expenditure required for the weight goal.;Weight Management: Provide education and appropriate resources to help participant work on and attain dietary goals.;Weight Management/Obesity: Establish reasonable short term and long term weight goals.;Obesity: Provide education and appropriate resources to help participant work on and attain dietary goals.  Has lost 20 pounds since surgery   Admit Weight 204 lb 14.4 oz (92.9 kg)   Goal Weight: Short Term 200 lb (90.7 kg)   Goal Weight: Long Term 170 lb (77.1 kg)   Expected Outcomes Short Term: Continue to assess and modify interventions until short term weight is achieved;Long Term: Adherence to nutrition and physical activity/exercise program aimed toward attainment of established weight goal;Weight Loss: Understanding of general recommendations for a balanced deficit meal plan, which promotes 1-2 lb weight loss per week and includes a negative energy balance of [phone] kcal/d;Understanding recommendations for meals to include 15-35% energy as protein, 25-35% energy from fat, 35-60% energy from carbohydrates, less than 200mg  of dietary cholesterol, 20-35 gm of total fiber daily;Understanding of distribution of calorie intake throughout the day with the consumption of 4-5 meals/snacks   Hypertension Yes   Intervention Provide education on lifestyle modifcations including regular physical activity/exercise, weight management, moderate sodium restriction and increased  consumption of fresh fruit, vegetables, and low fat dairy, alcohol moderation, and smoking cessation.;Monitor prescription use compliance.   Expected Outcomes Short Term: Continued assessment and intervention until BP is < 140/1mm HG in hypertensive participants. < 130/36mm HG in hypertensive participants with diabetes, heart failure or chronic kidney disease.;Long Term: Maintenance of blood pressure at goal levels.   Lipids Yes   Intervention Provide education and support for participant on nutrition & aerobic/resistive exercise along with prescribed medications to achieve LDL 70mg , HDL >40mg .   Expected Outcomes Short Term: Participant states understanding of desired cholesterol values and is compliant with medications prescribed. Participant is following exercise prescription and nutrition guidelines.;Long Term: Cholesterol controlled with medications as prescribed, with individualized exercise RX and with personalized nutrition plan. Value goals: LDL < 70mg , HDL > 40 mg.   Stress Yes   Intervention Offer individual and/or small group education and counseling on adjustment to heart disease, stress management and health-related lifestyle change. Teach and support self-help strategies.;Refer participants experiencing significant psychosocial distress to appropriate mental health specialists for further evaluation and treatment. When possible, include family members and significant others in education/counseling sessions.   Expected Outcomes Short Term: Participant demonstrates changes in health-related behavior, relaxation and other stress management skills, ability to obtain effective social support, and compliance with psychotropic medications if prescribed.;Long Term: Emotional wellbeing is indicated  by absence of clinically significant psychosocial distress or social isolation.      Core Components/Risk Factors/Patient Goals Review:      Goals and Risk Factor Review    Row Name 08/02/16 1702              Core Components/Risk Factors/Patient Goals Review   Personal Goals Review Weight Management/Obesity;Hypertension;Lipids       Review Alan Fernandez is down 2 pounds since starting the program. He would like to see the RD and we will make him an appointment. He had a week of vacation and did not gain any weight, even though he enjoyed his food.  Eating fruit and yogurt for breakfast. His BP and cholsetreol are in good range. His total cholesterol has dropped from 150 to 100 per Alan Fernandez.       Expected Outcomes Alan Fernandez continues to follow nutrition and exercise guidelines and maintain medication compliance working towards a healthier lifestyle          Core Components/Risk Factors/Patient Goals at Discharge (Final Review):      Goals and Risk Factor Review - 08/02/16 1702      Core Components/Risk Factors/Patient Goals Review   Personal Goals Review Weight Management/Obesity;Hypertension;Lipids   Review Alan Fernandez is down 2 pounds since starting the program. He would like to see the RD and we will make him an appointment. He had a week of vacation and did not gain any weight, even though he enjoyed his food.  Eating fruit and yogurt for breakfast. His BP and cholsetreol are in good range. His total cholesterol has dropped from 150 to 100 per Alan Fernandez.   Expected Outcomes Alan Fernandez continues to follow nutrition and exercise guidelines and maintain medication compliance working towards a healthier lifestyle      ITP Comments:     ITP Comments    Row Name 06/28/16 1307 07/05/16 1753 07/14/16 0933 07/22/16 1720 08/11/16 3825   ITP Comments Medical review completed today. ITP created. Documentation of diagnosis can be found in [facility] 05/04/2016  First full day of exercise!  Alan Fernandez was oriented to gym and equipment including functions, settings, policies, and procedures.  Alan Fernandez's individual exercise prescription and treatment plan were reviewed.  All starting workloads were established based on the results of the 6  minute walk test done at initial orientation visit.  The plan for exercise progression was also introduced and progression will be customized based on patient's performance and goals. 30 day review. Continue with ITP unless directed changes per Medical Director review  New to program Home exercise discussed today  Will use ellipitical and walk at home 30 day review. Continue with ITP unless directed changes per Medical Director review      Comments:

## 2016-08-11 NOTE — Progress Notes (Signed)
Daily Session Note  Patient Details  Name: Alan Fernandez MRN: [id number] Date of Birth: 02-20-1956 Referring Provider:     Cardiac Rehab from 06/28/2016 in [facility] Cardiac and Pulmonary Rehab  Referring Provider  End, Harrell Gave MD      Encounter Date: 08/11/2016  Check In:     Session Check In - 08/11/16 1707      Check-In   Location [facility]-Cardiac & Pulmonary Rehab   Staff Present Renita Papa, RN BSN;Carroll Enterkin, RN, Vickki Hearing, BA, ACSM CEP, Exercise Physiologist   Supervising physician immediately available to respond to emergencies See telemetry face sheet for immediately available ER MD   Medication changes reported     No   Fall or balance concerns reported    No   Warm-up and Cool-down Performed on first and last piece of equipment   Resistance Training Performed Yes   VAD Patient? No     Pain Assessment   Currently in Pain? No/denies         History  Smoking Status  . Former Smoker  . Packs/day: 2.00  . Years: 34.00  . Quit date: 12/05/2004  Smokeless Tobacco  . Never Used    Comment: Quit in 2007    Goals Met:  Proper associated with RPD/PD & O2 Sat Independence with exercise equipment Exercise tolerated well No report of cardiac concerns or symptoms Strength training completed today  Goals Unmet:  Not Applicable  Comments: Pt able to follow exercise prescription today without complaint.  Will continue to monitor for progression.    Dr. Emily Filbert is Medical Director for [place] and [facility].

## 2016-08-12 DIAGNOSIS — I214 Non-ST elevation (NSTEMI) myocardial infarction: Secondary | ICD-10-CM

## 2016-08-12 DIAGNOSIS — Z951 Presence of aortocoronary bypass graft: Secondary | ICD-10-CM

## 2016-08-12 NOTE — Progress Notes (Signed)
Daily Session Note  Patient Details  Name: Alan Fernandez MRN: [id number] Date of Birth: 1955/12/12 Referring Provider:     Cardiac Rehab from 06/28/2016 in [facility] Cardiac and Pulmonary Rehab  Referring Provider  End, Harrell Gave MD      Encounter Date: 08/12/2016  Check In:     Session Check In - 08/12/16 1722      Check-In   Location [facility]-Cardiac & Pulmonary Rehab   Staff Present Nyoka Cowden, RN, BSN, Kela Millin, BA, ACSM CEP, Exercise Physiologist;Kelly Amedeo Plenty, BS, ACSM CEP, Exercise Physiologist   Supervising physician immediately available to respond to emergencies See telemetry face sheet for immediately available ER MD   Medication changes reported     No   Fall or balance concerns reported    No   Warm-up and Cool-down Performed on first and last piece of equipment   Resistance Training Performed Yes   VAD Patient? No     Pain Assessment   Currently in Pain? No/denies         History  Smoking Status  . Former Smoker  . Packs/day: 2.00  . Years: 34.00  . Quit date: 12/05/2004  Smokeless Tobacco  . Never Used    Comment: Quit in 2007    Goals Met:  Independence with exercise equipment Exercise tolerated well No report of cardiac concerns or symptoms Strength training completed today  Goals Unmet:  Not Applicable  Comments: Pt able to follow exercise prescription today without complaint.  Will continue to monitor for progression.    Dr. Emily Filbert is Medical Director for [place] and [facility].

## 2016-08-16 ENCOUNTER — Encounter: Payer: 59 | Admitting: *Deleted

## 2016-08-16 DIAGNOSIS — Z951 Presence of aortocoronary bypass graft: Secondary | ICD-10-CM

## 2016-08-16 DIAGNOSIS — I214 Non-ST elevation (NSTEMI) myocardial infarction: Secondary | ICD-10-CM | POA: Diagnosis not present

## 2016-08-16 NOTE — Progress Notes (Signed)
Daily Session Note  Patient Details  Name: Alan Fernandez MRN: [id number] Date of Birth: 02/22/1955 Referring Provider:     Cardiac Rehab from 06/28/2016 in [facility] Cardiac and Pulmonary Rehab  Referring Provider  End, Christopher MD      Encounter Date: 08/16/2016  Check In:     Session Check In - 08/16/16 1627      Check-In   Location [facility]-Cardiac & Pulmonary Rehab   Staff Present Meredith Craven, RN BSN;Kelly Hayes, BS, ACSM CEP, Exercise Physiologist;Susanne Bice, RN, BSN, CCRP   Supervising physician immediately available to respond to emergencies See telemetry face sheet for immediately available ER MD   Medication changes reported     No   Fall or balance concerns reported    No   Warm-up and Cool-down Performed on first and last piece of equipment   Resistance Training Performed Yes   VAD Patient? No     Pain Assessment   Currently in Pain? No/denies   Multiple Pain Sites No         History  Smoking Status  . Former Smoker  . Packs/day: 2.00  . Years: 34.00  . Quit date: 12/05/2004  Smokeless Tobacco  . Never Used    Comment: Quit in 2007    Goals Met:  Independence with exercise equipment Exercise tolerated well No report of cardiac concerns or symptoms Strength training completed today  Goals Unmet:  Not Applicable  Comments: Pt able to follow exercise prescription today without complaint.  Will continue to monitor for progression.    Dr. Mark Miller is Medical Director for [facility] and [facility]. 

## 2016-08-18 ENCOUNTER — Encounter: Payer: 59 | Admitting: *Deleted

## 2016-08-18 ENCOUNTER — Encounter: Payer: Self-pay | Admitting: Dietician

## 2016-08-18 DIAGNOSIS — Z951 Presence of aortocoronary bypass graft: Secondary | ICD-10-CM

## 2016-08-18 DIAGNOSIS — I214 Non-ST elevation (NSTEMI) myocardial infarction: Secondary | ICD-10-CM

## 2016-08-18 NOTE — Progress Notes (Signed)
Daily Session Note  Patient Details  Name: Alan Fernandez MRN: [id number] Date of Birth: 1955/11/21 Referring Provider:     Cardiac Rehab from 06/28/2016 in [facility] Cardiac and Pulmonary Rehab  Referring Provider  End, Harrell Gave MD      Encounter Date: 08/18/2016  Check In:     Session Check In - 08/18/16 1636      Check-In   Location [facility]-Cardiac & Pulmonary Rehab   Staff Present Nyoka Cowden, RN, BSN, MA;Meredith Sherryll Burger, RN BSN;Rafi Kenneth, RN, BSN   Supervising physician immediately available to respond to emergencies See telemetry face sheet for immediately available ER MD   Medication changes reported     No   Fall or balance concerns reported    No   Tobacco Cessation No Change   Warm-up and Cool-down Performed on first and last piece of equipment   Resistance Training Performed Yes   VAD Patient? No     Pain Assessment   Currently in Pain? No/denies           Exercise Prescription Changes - 08/18/16 1500      Response to Exercise   Blood Pressure (Admit) 122/68   Blood Pressure (Exercise) 136/66   Blood Pressure (Exit) 104/62   Heart Rate (Admit) 58 bpm   Heart Rate (Exercise) 103 bpm   Heart Rate (Exit) 64 bpm   Rating of Perceived Exertion (Exercise) 13   Symptoms none   Duration Continue with 45 min of aerobic exercise without signs/symptoms of physical distress.   Intensity THRR unchanged     Progression   Progression Continue to progress workloads to maintain intensity without signs/symptoms of physical distress.   Average METs 3.5     Resistance Training   Training Prescription Yes   Weight 4 lbs   Reps 10-15     Interval Training   Interval Training No     Treadmill   MPH 3   Grade 0.5   Minutes 15   METs 3.5     NuStep   Level 6   Minutes 15   METs 3.5     Elliptical   Level 5   Speed 3.5   Minutes 15     Home Exercise Plan   Plans to continue exercise at Home (comment)  walking and elliptical   Frequency Add 2  additional days to program exercise sessions.      History  Smoking Status  . Former Smoker  . Packs/day: 2.00  . Years: 34.00  . Quit date: 12/05/2004  Smokeless Tobacco  . Never Used    Comment: Quit in 2007    Goals Met:  Proper associated with RPD/PD & O2 Sat Exercise tolerated well No report of cardiac concerns or symptoms Strength training completed today  Goals Unmet:  Not Applicable  Comments:     Dr. Emily Filbert is Medical Director for [place] and [facility].

## 2016-08-19 ENCOUNTER — Encounter: Payer: 59 | Admitting: *Deleted

## 2016-08-19 DIAGNOSIS — I214 Non-ST elevation (NSTEMI) myocardial infarction: Secondary | ICD-10-CM

## 2016-08-19 DIAGNOSIS — Z951 Presence of aortocoronary bypass graft: Secondary | ICD-10-CM

## 2016-08-19 NOTE — Progress Notes (Signed)
Daily Session Note  Patient Details  Name: Alan Fernandez MRN: [id number] Date of Birth: 08/19/1955 Referring Provider:     Cardiac Rehab from 06/28/2016 in [facility] Cardiac and Pulmonary Rehab  Referring Provider  End, Harrell Gave MD      Encounter Date: 08/19/2016  Check In:     Session Check In - 08/19/16 1642      Check-In   Location [facility]-Cardiac & Pulmonary Rehab   Staff Present Nyoka Cowden, RN, BSN, Bonnita Hollow, BS, ACSM CEP, Exercise Physiologist;Jimmie Dattilio, RN, BSN   Supervising physician immediately available to respond to emergencies See telemetry face sheet for immediately available ER MD   Medication changes reported     No   Fall or balance concerns reported    No   Tobacco Cessation No Change   Warm-up and Cool-down Performed on first and last piece of equipment   Resistance Training Performed Yes   VAD Patient? No     Pain Assessment   Currently in Pain? No/denies         History  Smoking Status  . Former Smoker  . Packs/day: 2.00  . Years: 34.00  . Quit date: 12/05/2004  Smokeless Tobacco  . Never Used    Comment: Quit in 2007    Goals Met:  Proper associated with RPD/PD & O2 Sat Exercise tolerated well No report of cardiac concerns or symptoms Strength training completed today  Goals Unmet:  Not Applicable  Comments:     Dr. Emily Filbert is Medical Director for [place] and [facility].

## 2016-08-23 ENCOUNTER — Encounter: Payer: 59 | Attending: Internal Medicine | Admitting: *Deleted

## 2016-08-23 DIAGNOSIS — I214 Non-ST elevation (NSTEMI) myocardial infarction: Secondary | ICD-10-CM | POA: Diagnosis not present

## 2016-08-23 DIAGNOSIS — Z951 Presence of aortocoronary bypass graft: Secondary | ICD-10-CM | POA: Diagnosis present

## 2016-08-23 NOTE — Progress Notes (Signed)
Daily Session Note  Patient Details  Name: Alan Fernandez MRN: [id number] Date of Birth: 07/15/1955 Referring Provider:     Cardiac Rehab from 06/28/2016 in [facility] Cardiac and Pulmonary Rehab  Referring Provider  End, Christopher MD      Encounter Date: 08/23/2016  Check In:     Session Check In - 08/23/16 1632      Check-In   Location [facility]-Cardiac & Pulmonary Rehab   Staff Present Meredith Craven, RN BSN;Kelly Hayes, BS, ACSM CEP, Exercise Physiologist;Amanda Sommer, BA, ACSM CEP, Exercise Physiologist   Supervising physician immediately available to respond to emergencies See telemetry face sheet for immediately available ER MD   Medication changes reported     No   Fall or balance concerns reported    No   Warm-up and Cool-down Performed on first and last piece of equipment   Resistance Training Performed Yes   VAD Patient? No     Pain Assessment   Currently in Pain? No/denies   Multiple Pain Sites No         History  Smoking Status  . Former Smoker  . Packs/day: 2.00  . Years: 34.00  . Quit date: 12/05/2004  Smokeless Tobacco  . Never Used    Comment: Quit in 2007    Goals Met:  Independence with exercise equipment Exercise tolerated well No report of cardiac concerns or symptoms Strength training completed today  Goals Unmet:  Not Applicable  Comments: Pt able to follow exercise prescription today without complaint.  Will continue to monitor for progression.    Dr. Mark Miller is Medical Director for [facility] and [facility]. 

## 2016-08-26 DIAGNOSIS — I214 Non-ST elevation (NSTEMI) myocardial infarction: Secondary | ICD-10-CM | POA: Diagnosis not present

## 2016-08-26 DIAGNOSIS — Z951 Presence of aortocoronary bypass graft: Secondary | ICD-10-CM

## 2016-08-26 NOTE — Progress Notes (Signed)
Daily Session Note  Patient Details  Name: Alan Fernandez MRN: [id number] Date of Birth: [birth year] Referring Provider:     Cardiac Rehab from 06/28/2016 in [facility] Cardiac and Pulmonary Rehab  Referring Provider  End, Harrell Gave MD      Encounter Date: 08/26/2016  Check In:     Session Check In - 08/26/16 1708      Check-In   Location [facility]-Cardiac & Pulmonary Rehab   Staff Present Gerlene Burdock, RN, Moises Blood, BS, ACSM CEP, Exercise Physiologist;Burdette Forehand Flavia Shipper   Supervising physician immediately available to respond to emergencies See telemetry face sheet for immediately available ER MD   Medication changes reported     No   Fall or balance concerns reported    No   Tobacco Cessation No Change   Warm-up and Cool-down Performed on first and last piece of equipment   Resistance Training Performed Yes   VAD Patient? No     Pain Assessment   Currently in Pain? No/denies   Multiple Pain Sites No         History  Smoking Status  . Former Smoker  . Packs/day: 2.00  . Years: 34.00  . Quit date: 12/05/2004  Smokeless Tobacco  . Never Used    Comment: Quit in 2007    Goals Met:  Independence with exercise equipment Exercise tolerated well No report of cardiac concerns or symptoms Strength training completed today  Goals Unmet:  Not Applicable  Comments: Pt able to follow exercise prescription today without complaint.  Will continue to monitor for progression.   Dr. Emily Filbert is Medical Director for [place] and [facility].

## 2016-08-30 ENCOUNTER — Encounter: Payer: 59 | Admitting: *Deleted

## 2016-08-30 DIAGNOSIS — I214 Non-ST elevation (NSTEMI) myocardial infarction: Secondary | ICD-10-CM | POA: Diagnosis not present

## 2016-08-30 DIAGNOSIS — Z951 Presence of aortocoronary bypass graft: Secondary | ICD-10-CM

## 2016-08-30 NOTE — Progress Notes (Signed)
Daily Session Note  Patient Details  Name: Alan Fernandez MRN: [id number] Date of Birth: [birth year] Referring Provider:     Cardiac Rehab from 06/28/2016 in [facility] Cardiac and Pulmonary Rehab  Referring Provider  End, Harrell Gave MD      Encounter Date: 08/30/2016  Check In:     Session Check In - 08/30/16 1701      Check-In   Location [facility]-Cardiac & Pulmonary Rehab   Staff Present Nyoka Cowden, RN, BSN, MA;Meredith Sherryll Burger, RN Moises Blood, BS, ACSM CEP, Exercise Physiologist;Joseph Flavia Shipper   Supervising physician immediately available to respond to emergencies See telemetry face sheet for immediately available ER MD   Medication changes reported     No   Fall or balance concerns reported    No   Tobacco Cessation No Change   Warm-up and Cool-down Performed on first and last piece of equipment   Resistance Training Performed Yes   VAD Patient? No     Pain Assessment   Currently in Pain? No/denies         History  Smoking Status  . Former Smoker  . Packs/day: 2.00  . Years: 34.00  . Quit date: 12/05/2004  Smokeless Tobacco  . Never Used    Comment: Quit in 2007    Goals Met:  Proper associated with RPD/PD & O2 Sat Independence with exercise equipment Exercise tolerated well No report of cardiac concerns or symptoms Strength training completed today  Goals Unmet:  Not Applicable  Comments: Pt able to follow exercise prescription today without complaint.  Will continue to monitor for progression.    Dr. Emily Filbert is Medical Director for [place] and [facility].

## 2016-09-01 ENCOUNTER — Encounter: Payer: 59 | Admitting: *Deleted

## 2016-09-01 DIAGNOSIS — I214 Non-ST elevation (NSTEMI) myocardial infarction: Secondary | ICD-10-CM | POA: Diagnosis not present

## 2016-09-01 NOTE — Progress Notes (Signed)
Daily Session Note  Patient Details  Name: DWIGHT ADAMCZAK MRN: [id number] Date of Birth: 08-14-1955 Referring Provider:     Cardiac Rehab from 06/28/2016 in [facility] Cardiac and Pulmonary Rehab  Referring Provider  End, Harrell Gave MD      Encounter Date: 09/01/2016  Check In:     Session Check In - 09/01/16 1705      Check-In   Location [facility]-Cardiac & Pulmonary Rehab   Staff Present Nyoka Cowden, RN, BSN, MA;Meredith Sherryll Burger, RN Vickki Hearing, BA, ACSM CEP, Exercise Physiologist   Supervising physician immediately available to respond to emergencies See telemetry face sheet for immediately available ER MD   Medication changes reported     No   Fall or balance concerns reported    No   Warm-up and Cool-down Performed on first and last piece of equipment   Resistance Training Performed Yes   VAD Patient? No     Pain Assessment   Currently in Pain? No/denies         History  Smoking Status  . Former Smoker  . Packs/day: 2.00  . Years: 34.00  . Quit date: 12/05/2004  Smokeless Tobacco  . Never Used    Comment: Quit in 2007    Goals Met:  Proper associated with RPD/PD & O2 Sat Independence with exercise equipment Using PLB without cueing & demonstrates good technique Exercise tolerated well No report of cardiac concerns or symptoms Strength training completed today  Goals Unmet:  Not Applicable  Comments: Pt able to follow exercise prescription today without complaint.  Will continue to monitor for progression.    Dr. Emily Filbert is Medical Director for [place] and [facility].

## 2016-09-02 VITALS — Ht 70.0 in | Wt 201.0 lb

## 2016-09-02 DIAGNOSIS — Z951 Presence of aortocoronary bypass graft: Secondary | ICD-10-CM

## 2016-09-02 DIAGNOSIS — I214 Non-ST elevation (NSTEMI) myocardial infarction: Secondary | ICD-10-CM

## 2016-09-02 NOTE — Progress Notes (Signed)
Daily Session Note  Patient Details  Name: Alan Fernandez MRN: [id number] Date of Birth: Jun 22, 1955 Referring Provider:     Cardiac Rehab from 06/28/2016 in [facility] Cardiac and Pulmonary Rehab  Referring Provider  End, Harrell Gave MD      Encounter Date: 09/02/2016  Check In:     Session Check In - 09/02/16 1621      Check-In   Location [facility]-Cardiac & Pulmonary Rehab   Staff Present Nyoka Cowden, RN, BSN, Bonnita Hollow, BS, ACSM CEP, Exercise Physiologist;Joseph Flavia Shipper   Supervising physician immediately available to respond to emergencies See telemetry face sheet for immediately available ER MD   Medication changes reported     No   Fall or balance concerns reported    No   Tobacco Cessation No Change   Warm-up and Cool-down Performed on first and last piece of equipment   Resistance Training Performed Yes   VAD Patient? No     Pain Assessment   Currently in Pain? No/denies   Multiple Pain Sites No         History  Smoking Status  . Former Smoker  . Packs/day: 2.00  . Years: 34.00  . Quit date: 12/05/2004  Smokeless Tobacco  . Never Used    Comment: Quit in 2007    Goals Met:  Proper associated with RPD/PD & O2 Sat Independence with exercise equipment Exercise tolerated well Personal goals reviewed Strength training completed today  Goals Unmet:  Not Applicable  Comments: Pt able to follow exercise prescription today without complaint.  Will continue to monitor for progression.     [place] Name 06/28/16 1526 09/02/16 1732       6 Minute Walk   Phase Initial Discharge    Distance 1425 feet 1700 feet    Distance % Change  - 19 %    Walk Time 6 minutes 6 minutes    # of Rest Breaks 0 0    MPH 2 3.22    METS 3.42 4    RPE 11 14    VO2 Peak 11.99 14.02    Symptoms No No    Resting HR 58 bpm 59 bpm    Resting BP 118/62 100/52    Max Ex. HR 81 bpm 87 bpm    Max Ex. BP 142/60 144/80        Dr. Emily Filbert is Medical  Director for [place] and [facility].

## 2016-09-06 ENCOUNTER — Encounter: Payer: 59 | Admitting: *Deleted

## 2016-09-06 DIAGNOSIS — I214 Non-ST elevation (NSTEMI) myocardial infarction: Secondary | ICD-10-CM

## 2016-09-06 DIAGNOSIS — Z951 Presence of aortocoronary bypass graft: Secondary | ICD-10-CM

## 2016-09-06 NOTE — Progress Notes (Signed)
Cardiac Individual Treatment Plan  Patient Details  Name: Alan Fernandez MRN: [id number] Date of Birth: 12-08-1955 Referring Provider:     Cardiac Rehab from 06/28/2016 in [facility] Cardiac and Pulmonary Rehab  Referring Provider  End, Harrell Gave MD      Initial Encounter Date:    Cardiac Rehab from 06/28/2016 in [facility] Cardiac and Pulmonary Rehab  Date  06/28/16  Referring Provider  End, Harrell Gave MD      Visit Diagnosis: NSTEMI (non-ST elevated myocardial infarction) ([place])  S/P CABG x 4  Patient's Home Medications on Admission:  Current Outpatient Prescriptions:  .  aspirin EC 325 MG EC tablet, Take 1 tablet (325 mg total) by mouth daily., Disp: 30 tablet, Rfl: 0 .  atorvastatin (LIPITOR) 40 MG tablet, Take 1 tablet (40 mg total) by mouth daily at 6 PM., Disp: 90 tablet, Rfl: 2 .  clobetasol (TEMOVATE) 0.05 % external solution, Apply 1 application topically as needed. Rash, Disp: , Rfl:  .  lisinopril (PRINIVIL,ZESTRIL) 2.5 MG tablet, Take 1 tablet (2.5 mg total) by mouth daily., Disp: 90 tablet, Rfl: 0 .  metoprolol tartrate (LOPRESSOR) 25 MG tablet, Take 1 tablet (25 mg total) by mouth 2 (two) times daily., Disp: 180 tablet, Rfl: 0 .  montelukast (SINGULAIR) 10 MG tablet, Take 1 tablet by mouth daily. At bedtime., Disp: , Rfl:  .  VENTOLIN HFA 108 (90 BASE) MCG/ACT inhaler, Inhale 2 puffs into the lungs every 4 (four) hours as needed. , Disp: , Rfl:   Past Medical History: Past Medical History:  Diagnosis Date  . Asthma   . CAD (coronary artery disease)    a. 04/2016 NSTEMI/Cath: LM 20/90, LAD 40p, 33m LCX 60ost, 759mRCA 40p, 3067mPDA 70, EF 55-65%;  b. 04/2016 s/p CABG x 2: LIMA->LAD, VG->RPL, VG->OM1->OM3.  . Diastolic dysfunction    a. 04/2016 Echo: EF 60-65%, grade 1 DD, mildly to mod dil RA.  . EMarland Kitchenzema   . Edema extremities   . GERD (gastroesophageal reflux disease)   . Hemorrhoid   . Pneumonia Feb 2016    Tobacco Use: History  Smoking Status  . Former Smoker  .  Packs/day: 2.00  . Years: 34.00  . Quit date: 12/05/2004  Smokeless Tobacco  . Never Used    Comment: Quit in 2007    Labs: Recent Review Flowsheet Data    Labs for ITP Cardiac and Pulmonary Rehab Latest Ref Rng & Units 05/05/2016 05/05/2016 05/05/2016 05/06/2016 07/02/2016   Cholestrol 0 - 200 mg/dL - - - - 100   LDLCALC 0 - 99 mg/dL - - - - 52   HDL >40 mg/dL - - - - 28(L)   Trlycerides <150 mg/dL - - - - 100   Hemoglobin A1c 4.8 - 5.6 % - - - - -   PHART 7.350 - 7.450 7.345(L) 7.332(L) - - -   PCO2ART 32.0 - 48.0 mmHg 44.9 42.1 - - -   HCO3 20.0 - 28.0 mmol/L 24.8 22.5 - - -   TCO2 0 - 100 mmol/L _0 -   ACIDBASEDEF 0.0 - 2.0 mmol/L 1.0 3.0(H) - - -   O2SAT % 96.0 92.0 - - -       Exercise Target Goals:    Exercise Program Goal: Individual exercise prescription set with THRR, safety & activity barriers. Participant demonstrates ability to understand and report RPE using BORG scale, to self-measure pulse accurately, and to acknowledge the importance of the exercise prescription.  Exercise Prescription  Goal: Starting with aerobic activity 30 plus minutes a day, 3 days per week for initial exercise prescription. Provide home exercise prescription and guidelines that participant acknowledges understanding prior to discharge.  Activity Barriers & Risk Stratification:     Activity Barriers & Cardiac Risk Stratification - 06/28/16 1323      Activity Barriers & Cardiac Risk Stratification   Activity Barriers None  Left hip will hurt some with walking,resolves when he rests. Advised he talk to his MD about this symptom.    Cardiac Risk Stratification High      6 Minute Walk:     6 Minute Walk    Row Name 06/28/16 1526 09/02/16 1732       6 Minute Walk   Phase Initial Discharge    Distance 1425 feet 1700 feet    Distance % Change  - 19 %    Walk Time 6 minutes 6 minutes    # of Rest Breaks 0 0    MPH 2 3.22    METS 3.42 4    RPE 11 14    VO2 Peak 11.99 14.02     Symptoms No No    Resting HR 58 bpm 59 bpm    Resting BP 118/62 100/52    Max Ex. HR 81 bpm 87 bpm    Max Ex. BP 142/60 144/80       Oxygen Initial Assessment:   Oxygen Re-Evaluation:   Oxygen Discharge (Final Oxygen Re-Evaluation):   Initial Exercise Prescription:     Initial Exercise Prescription - 06/28/16 1500      Date of Initial Exercise RX and Referring Provider   Date 06/28/16   Referring Provider End, Harrell Gave MD     Treadmill   MPH 2.5   Grade 0.5   Minutes 15   METs 3.09     NuStep   Level 3   SPM 80   Minutes 15   METs 2     Recumbant Elliptical   Level 2   RPM 50   Minutes 15   METs 2     Prescription Details   Frequency (times per week) 3   Duration Progress to 45 minutes of aerobic exercise without signs/symptoms of physical distress     Intensity   THRR 40-80% of Max Heartrate 98-139   Ratings of Perceived Exertion 11-13   Perceived Dyspnea 0-4     Progression   Progression Continue to progress workloads to maintain intensity without signs/symptoms of physical distress.     Resistance Training   Training Prescription Yes   Weight 4 lbs   Reps 10-15      Perform Capillary Blood Glucose checks as needed.  Exercise Prescription Changes:     Exercise Prescription Changes    Row Name 06/28/16 1500 07/07/16 1400 07/22/16 1100 08/05/16 1500 08/18/16 1500     Response to Exercise   Blood Pressure (Admit) 118/62 104/62 110/60 110/56 122/68   Blood Pressure (Exercise) 142/60 144/72 142/82 114/58 136/66   Blood Pressure (Exit)  - 100/60 116/64  - 104/62   Heart Rate (Admit) 58 bpm 69 bpm 58 bpm 63 bpm 58 bpm   Heart Rate (Exercise) 81 bpm 92 bpm 88 bpm 94 bpm 103 bpm   Heart Rate (Exit)  - 72 bpm 63 bpm 66 bpm 64 bpm   Oxygen Saturation (Admit) 97 %  -  -  -  -   Rating of Perceived Exertion (Exercise) _0 13  Symptoms none  -  -  - none   Comments walk test results  -  -  -  -   Duration  -  -  -  - Continue with  45 min of aerobic exercise without signs/symptoms of physical distress.   Intensity  -  -  -  - THRR unchanged     Progression   Progression  -  -  -  - Continue to progress workloads to maintain intensity without signs/symptoms of physical distress.   Average METs  -  - 3.25  - 3.5     Resistance Training   Training Prescription  - No Yes Yes Yes   Weight  - _0 lbs   Reps  - 10-15 10-15 10-15 10-15     Interval Training   Interval Training  -  -  -  - No     Treadmill   MPH  - 2.5 2._1 Grade  - 0.5 0.5 0.5 0.5   Minutes  - _2 METs  - 3.09 3.09 3.5 3.5     NuStep   Level  -  - _3 SPM  -  - 80 80  -   Minutes  -  - _4 METs  -  - 3.5 3.8 3.5     Elliptical   Level  -  -  -  - 5   Speed  -  -  -  - 3.5   Minutes  -  -  -  - 15     Home Exercise Plan   Plans to continue exercise at  -  -  -  - Home (comment)  walking and elliptical   Frequency  -  -  -  - Add 2 additional days to program exercise sessions.   [place] Name 09/03/16 1300             Response to Exercise   Blood Pressure (Admit) 100/52       Blood Pressure (Exercise) 144/80       Blood Pressure (Exit) 110/58       Heart Rate (Admit) 37 bpm       Heart Rate (Exercise) 99 bpm       Heart Rate (Exit) 65 bpm       Rating of Perceived Exertion (Exercise) 15       Symptoms none       Duration Continue with 45 min of aerobic exercise without signs/symptoms of physical distress.       Intensity THRR unchanged         Progression   Progression Continue to progress workloads to maintain intensity without signs/symptoms of physical distress.       Average METs 4.12         Resistance Training   Training Prescription Yes       Weight 4 lbs       Reps 10-15         Treadmill   MPH 3       Grade 2       Minutes 15       METs 4.12          Exercise Comments:     Exercise Comments    Row Name 07/05/16 1753 07/07/16 1423 07/22/16 1158 08/05/16 1526 09/02/16 1738    Exercise Comments  First full day  of exercise!  Alan Fernandez was oriented to gym and equipment including functions, settings, policies, and procedures.  Alan Fernandez's individual exercise prescription and treatment plan were reviewed.  All starting workloads were established based on the results of the 6 minute walk test done at initial orientation visit.  The plan for exercise progression was also introduced and progression will be customized based on patient's performance and goals. Alan Fernandez completed first full day of exercise with no issues.  Staff reviewed safyet, THR and RPE. Alan Fernandez is tolerating exercise well and has not had any trouble with strength or cardio. Alan Fernandez is progressing well with exrecise.  Staff will continue to monitor.  He has switched to upright ellipitcal instead of recumbent. 6 min walk done today. Results reviewed with patient. See 6 min walk data for detailed report.       Exercise Goals and Review:     Exercise Goals    Row Name 06/28/16 1530             Exercise Goals   Increase Physical Activity Yes       Intervention Provide advice, education, support and counseling about physical activity/exercise needs.;Develop an individualized exercise prescription for aerobic and resistive training based on initial evaluation findings, risk stratification, comorbidities and participant's personal goals.       Expected Outcomes Achievement of increased cardiorespiratory fitness and enhanced flexibility, muscular endurance and strength shown through measurements of functional capacity and personal statement of participant.       Increase Strength and Stamina Yes       Intervention Provide advice, education, support and counseling about physical activity/exercise needs.;Develop an individualized exercise prescription for aerobic and resistive training based on initial evaluation findings, risk stratification, comorbidities and participant's personal goals.       Expected Outcomes Achievement of  increased cardiorespiratory fitness and enhanced flexibility, muscular endurance and strength shown through measurements of functional capacity and personal statement of participant.          Exercise Goals Re-Evaluation :     Exercise Goals Re-Evaluation    Row Name 08/18/16 1502 09/03/16 1311           Exercise Goal Re-Evaluation   Exercise Goals Review Increase Strenth and Stamina;Increase Physical Activity Increase Physical Activity;Increase Strenth and Stamina      Comments Alan Fernandez is doing well in rehab.  He is up to level 5 on the elliptical now!  We will continue to monitor his progression. Alan Fernandez has improved overall fitness and will graduate Monday.      Expected Outcomes Short: Add more incline to treadmill.  Long: Continue to make exercise part of regular routine. Alan Fernandez will graduate and continue to exercise on his own.         Discharge Exercise Prescription (Final Exercise Prescription Changes):     Exercise Prescription Changes - 09/03/16 1300      Response to Exercise   Blood Pressure (Admit) 100/52   Blood Pressure (Exercise) 144/80   Blood Pressure (Exit) 110/58   Heart Rate (Admit) 37 bpm   Heart Rate (Exercise) 99 bpm   Heart Rate (Exit) 65 bpm   Rating of Perceived Exertion (Exercise) 15   Symptoms none   Duration Continue with 45 min of aerobic exercise without signs/symptoms of physical distress.   Intensity THRR unchanged     Progression   Progression Continue to progress workloads to maintain intensity without signs/symptoms of physical distress.   Average METs 4.12     Resistance Training  Training Prescription Yes   Weight 4 lbs   Reps 10-15     Treadmill   MPH 3   Grade 2   Minutes 15   METs 4.12      Nutrition:  Target Goals: Understanding of nutrition guidelines, daily intake of sodium <1585m, cholesterol <2023m calories 30% from fat and 7% or less from saturated fats, daily to have 5 or more servings of fruits and  vegetables.  Biometrics:     Pre Biometrics - 06/28/16 1530      Pre Biometrics   Height _0  (1.778 m)   Weight 203 lb 14.4 oz (92.5 kg)   Waist Circumference 44.25 inches   Hip Circumference 42.5 inches   Waist to Hip Ratio 1.04 %   BMI (Calculated) 29.3   Single Leg Stand 30 seconds         Post Biometrics - 09/02/16 1734       Post  Biometrics   Height _1  (1.778 m)   Weight 201 lb (91.2 kg)   Waist Circumference 40.5 inches   Hip Circumference 40 inches   Waist to Hip Ratio 1.01 %   BMI (Calculated) 28.9   Single Leg Stand 30 seconds      Nutrition Therapy Plan and Nutrition Goals:     Nutrition Therapy & Goals - 08/18/16 1601      Nutrition Therapy   Diet TLC   Drug/Food Interactions Statins/Certain Fruits   Protein (specify units) 8oz   Fruits and Vegetables 8 servings/day   Sodium 1500 grams     Personal Nutrition Goals   Nutrition Goal keep close to 1500 calories daily for weight loss   Personal Goal #2 maintain your healthy food choices and eating pattern   Personal Goal #3 Continue to gradually increase exercise as able.   Comments Alan Fernandez made significant diet changes since having heart surgery. He would like to lose more weight, and will continue with his efforts to do so.      Intervention Plan   Intervention Nutrition handout(s) given to patient.   Expected Outcomes Short Term Goal: A plan has been developed with personal nutrition goals set during dietitian appointment.;Long Term Goal: Adherence to prescribed nutrition plan.;Short Term Goal: Understand basic principles of dietary content, such as calories, fat, sodium, cholesterol and nutrients.      Nutrition Discharge: Rate Your Plate Scores:     Nutrition Assessments - 09/06/16 1623      MEDFICTS Scores   Post Score 3      Nutrition Goals Re-Evaluation:   Nutrition Goals Discharge (Final Nutrition Goals Re-Evaluation):   Psychosocial: Target Goals: Acknowledge  presence or absence of significant depression and/or stress, maximize coping skills, provide positive support system. Participant is able to verbalize types and ability to use techniques and skills needed for reducing stress and depression.   Initial Review & Psychosocial Screening:     Initial Psych Review & Screening - 06/28/16 1317      Initial Review   Current issues with None Identified  Is concerned about overdoing it after having open heart surgery. Is 8 weeks out form surgery and is noticing incerased ability to perform daily activities.      Family Dynamics   Good Support System? Yes     Barriers   Psychosocial barriers to participate in program There are no identifiable barriers or psychosocial needs.;The patient should benefit from training in stress management and relaxation.     Screening Interventions  Interventions Encouraged to exercise;Provide feedback about the scores to participant;To provide support and resources with identified psychosocial needs  Work stress is only identified stress.       Quality of Life Scores:      Quality of Life - 06/28/16 1319      Quality of Life Scores   Health/Function Pre 21.6 %   Socioeconomic Pre 26.57 %   Psych/Spiritual Pre 26.57 %   Family Pre 25.2 %   GLOBAL Pre 24.18 %      PHQ-9: Recent Review Flowsheet Data    Depression screen [facility] 2/9 09/06/2016 06/28/2016   Decreased Interest 0 0   Down, Depressed, Hopeless 0 0   PHQ - 2 Score 0 0   Altered sleeping 0 1   Tired, decreased energy 0 1   Change in appetite 0 0   Feeling bad or failure about yourself  0 0   Trouble concentrating 0 0   Moving slowly or fidgety/restless 0 0   Suicidal thoughts 0 0   PHQ-9 Score 0 2   Difficult doing work/chores - Not difficult at all     Interpretation of Total Score  Total Score Depression Severity:  1-4 = Minimal depression, 5-9 = Mild depression, 10-14 = Moderate depression, 15-19 = Moderately severe depression, 20-27 =  Severe depression   Psychosocial Evaluation and Intervention:     Psychosocial Evaluation - 08/02/16 1657      Psychosocial Evaluation & Interventions   Expected Outcomes Alan Fernandez will exercise consistently to achieve his stated goals.  He will benefit from the psychoeducational components of this program to learn positive ways to cope with stress in his life.  He will also benefit from meeting with the dietician to address his weight loss goals.        Psychosocial Re-Evaluation:     Psychosocial Re-Evaluation    [place] Name 08/02/16 1640 08/02/16 1656           Psychosocial Re-Evaluation   Current issues with None Identified  -      Comments  - Alan Fernandez continues without significant stress.HAS seen great increase in exercise levels since started.  He continued to exerciselast week while on vacation      Expected Outcomes Alan Fernandez will exercise consistently to achieve his stated goals.  He will benefit from the psychoeducational components of this program to learn positive ways to cope with stress in his life.  He will also benefit from meeting with the dietician to address his weight loss goals.    -      Interventions Stress management education;Encouraged to attend Cardiac Rehabilitation for the exercise;Relaxation education  -      Continue Psychosocial Services  Follow up required by staff  -         Psychosocial Discharge (Final Psychosocial Re-Evaluation):     Psychosocial Re-Evaluation - 08/02/16 1656      Psychosocial Re-Evaluation   Comments Alan Fernandez continues without significant stress.HAS seen great increase in exercise levels since started.  He continued to exerciselast week while on vacation      Vocational Rehabilitation: Provide vocational rehab assistance to qualifying candidates.   Vocational Rehab Evaluation & Intervention:     Vocational Rehab - 06/28/16 1322      Initial Vocational Rehab Evaluation & Intervention   Assessment shows need for Vocational  Rehabilitation No      Education: Education Goals: Education classes will be provided on a weekly basis, covering required topics. Participant will state understanding/return  demonstration of topics presented.  Learning Barriers/Preferences:     Learning Barriers/Preferences - 06/28/16 1320      Learning Barriers/Preferences   Learning Barriers None   Learning Preferences None      Education Topics: General Nutrition Guidelines/Fats and Fiber: -Group instruction provided by verbal, written material, models and posters to present the general guidelines for heart healthy nutrition. Gives an explanation and review of dietary fats and fiber.   Cardiac Rehab from 09/01/2016 in [facility] Cardiac and Pulmonary Rehab  Date  08/16/16  Educator  PI  Instruction Review Code  2- meets goals/outcomes      Controlling Sodium/Reading Food Labels: -Group verbal and written material supporting the discussion of sodium use in heart healthy nutrition. Review and explanation with models, verbal and written materials for utilization of the food label.   Cardiac Rehab from 09/01/2016 in [facility] Cardiac and Pulmonary Rehab  Date  08/23/16  Educator  PI  Instruction Review Code  2- meets goals/outcomes      Exercise Physiology & Risk Factors: - Group verbal and written instruction with models to review the exercise physiology of the cardiovascular system and associated critical values. Details cardiovascular disease risk factors and the goals associated with each risk factor.   Cardiac Rehab from 09/01/2016 in [facility] Cardiac and Pulmonary Rehab  Date  08/30/16  Educator  [facility]  Instruction Review Code  2- meets goals/outcomes      Aerobic Exercise & Resistance Training: - Gives group verbal and written discussion on the health impact of inactivity. On the components of aerobic and resistive training programs and the benefits of this training and how to safely progress through these programs.   Cardiac Rehab  from 09/01/2016 in [facility] Cardiac and Pulmonary Rehab  Date  09/01/16  Educator  AS  Instruction Review Code  2- meets goals/outcomes      Flexibility, Balance, General Exercise Guidelines: - Provides group verbal and written instruction on the benefits of flexibility and balance training programs. Provides general exercise guidelines with specific guidelines to those with heart or lung disease. Demonstration and skill practice provided.   Cardiac Rehab from 09/01/2016 in [facility] Cardiac and Pulmonary Rehab  Date  07/12/16  Educator  [facility]  Instruction Review Code  2- meets goals/outcomes      Stress Management: - Provides group verbal and written instruction about the health risks of elevated stress, cause of high stress, and healthy ways to reduce stress.   Cardiac Rehab from 09/01/2016 in [facility] Cardiac and Pulmonary Rehab  Date  07/21/16  Educator  [facility]  Instruction Review Code  2- meets goals/outcomes      Depression: - Provides group verbal and written instruction on the correlation between heart/lung disease and depressed mood, treatment options, and the stigmas associated with seeking treatment.   Cardiac Rehab from 09/01/2016 in [facility] Cardiac and Pulmonary Rehab  Date  08/18/16  Educator  Lucianne Lei, MSW  Instruction Review Code  2- meets goals/outcomes      Anatomy & Physiology of the Heart: - Group verbal and written instruction and models provide basic cardiac anatomy and physiology, with the coronary electrical and arterial systems. Review of: AMI, Angina, Valve disease, Heart Failure, Cardiac Arrhythmia, Pacemakers, and the ICD.   Cardiac Procedures: - Group verbal and written instruction and models to describe the testing methods done to diagnose heart disease. Reviews the outcomes of the test results. Describes the treatment choices: Medical Management, Angioplasty, or Coronary Bypass Surgery.   Cardiac Medications: -  Group verbal and written instruction to review  commonly prescribed medications for heart disease. Reviews the medication, class of the drug, and side effects. Includes the steps to properly store meds and maintain the prescription regimen.   Cardiac Rehab from 09/01/2016 in [facility] Cardiac and Pulmonary Rehab  Date  08/04/16 Marisue Humble 1]  Educator  CE [Part 2 meds]  Instruction Review Code  2- meets goals/outcomes      Go Sex-Intimacy & Heart Disease, Get SMART - Goal Setting: - Group verbal and written instruction through game format to discuss heart disease and the return to sexual intimacy. Provides group verbal and written material to discuss and apply goal setting through the application of the S.M.A.R.T. Method.   Other Matters of the Heart: - Provides group verbal, written materials and models to describe Heart Failure, Angina, Valve Disease, and Diabetes in the realm of heart disease. Includes description of the disease process and treatment options available to the cardiac patient.   Exercise & Equipment Safety: - Individual verbal instruction and demonstration of equipment use and safety with use of the equipment.   Cardiac Rehab from 09/01/2016 in [facility] Cardiac and Pulmonary Rehab  Date  06/28/16  Educator  Sb  Instruction Review Code  2- meets goals/outcomes      Infection Prevention: - Provides verbal and written material to individual with discussion of infection control including proper hand washing and proper equipment cleaning during exercise session.   Cardiac Rehab from 09/01/2016 in [facility] Cardiac and Pulmonary Rehab  Date  06/28/16  Educator  Sb  Instruction Review Code  2- meets goals/outcomes      Falls Prevention: - Provides verbal and written material to individual with discussion of falls prevention and safety.   Cardiac Rehab from 09/01/2016 in [facility] Cardiac and Pulmonary Rehab  Date  06/28/16  Educator  SB  Instruction Review Code  2- meets goals/outcomes      Diabetes: - Individual verbal and written  instruction to review signs/symptoms of diabetes, desired ranges of glucose level fasting, after meals and with exercise. Advice that pre and post exercise glucose checks will be done for 3 sessions at entry of program.    Knowledge Questionnaire Score:     Knowledge Questionnaire Score - 09/06/16 1622      Knowledge Questionnaire Score   Post Score 28      Core Components/Risk Factors/Patient Goals at Admission:     Personal Goals and Risk Factors at Admission - 06/28/16 1322      Core Components/Risk Factors/Patient Goals on Admission    Weight Management Yes;Obesity;Weight Loss   Intervention Weight Management: Develop a combined nutrition and exercise program designed to reach desired caloric intake, while maintaining appropriate intake of nutrient and fiber, sodium and fats, and appropriate energy expenditure required for the weight goal.;Weight Management: Provide education and appropriate resources to help participant work on and attain dietary goals.;Weight Management/Obesity: Establish reasonable short term and long term weight goals.;Obesity: Provide education and appropriate resources to help participant work on and attain dietary goals.  Has lost 20 pounds since surgery   Admit Weight 204 lb 14.4 oz (92.9 kg)   Goal Weight: Short Term 200 lb (90.7 kg)   Goal Weight: Long Term 170 lb (77.1 kg)   Expected Outcomes Short Term: Continue to assess and modify interventions until short term weight is achieved;Long Term: Adherence to nutrition and physical activity/exercise program aimed toward attainment of established weight goal;Weight Loss: Understanding of general recommendations for a balanced deficit  meal plan, which promotes 1-2 lb weight loss per week and includes a negative energy balance of [phone] kcal/d;Understanding recommendations for meals to include 15-35% energy as protein, 25-35% energy from fat, 35-60% energy from carbohydrates, less than 252m of dietary  cholesterol, 20-35 gm of total fiber daily;Understanding of distribution of calorie intake throughout the day with the consumption of 4-5 meals/snacks   Hypertension Yes   Intervention Provide education on lifestyle modifcations including regular physical activity/exercise, weight management, moderate sodium restriction and increased consumption of fresh fruit, vegetables, and low fat dairy, alcohol moderation, and smoking cessation.;Monitor prescription use compliance.   Expected Outcomes Short Term: Continued assessment and intervention until BP is < 140/919mHG in hypertensive participants. < 130/8048mG in hypertensive participants with diabetes, heart failure or chronic kidney disease.;Long Term: Maintenance of blood pressure at goal levels.   Lipids Yes   Intervention Provide education and support for participant on nutrition & aerobic/resistive exercise along with prescribed medications to achieve LDL <81m53mDL >40mg65mExpected Outcomes Short Term: Participant states understanding of desired cholesterol values and is compliant with medications prescribed. Participant is following exercise prescription and nutrition guidelines.;Long Term: Cholesterol controlled with medications as prescribed, with individualized exercise RX and with personalized nutrition plan. Value goals: LDL < 81mg,9m > 40 mg.   Stress Yes   Intervention Offer individual and/or small group education and counseling on adjustment to heart disease, stress management and health-related lifestyle change. Teach and support self-help strategies.;Refer participants experiencing significant psychosocial distress to appropriate mental health specialists for further evaluation and treatment. When possible, include family members and significant others in education/counseling sessions.   Expected Outcomes Short Term: Participant demonstrates changes in health-related behavior, relaxation and other stress management skills, ability to  obtain effective social support, and compliance with psychotropic medications if prescribed.;Long Term: Emotional wellbeing is indicated by absence of clinically significant psychosocial distress or social isolation.      Core Components/Risk Factors/Patient Goals Review:      Goals and Risk Factor Review    Row Name 08/02/16 1702 08/26/16 1745           Core Components/Risk Factors/Patient Goals Review   Personal Goals Review Weight Management/Obesity;Hypertension;Lipids Weight Management/Obesity;Hypertension;Lipids      Review Alan Fernandez Alan Doloreswn 2 pounds since starting the program. He would like to see the RD and we will make him an appointment. He had a week of vacation and did not gain any weight, even though he enjoyed his food.  Eating fruit and yogurt for breakfast. His BP and cholsetreol are in good range. His total cholesterol has dropped from 150 to 100 per Alan Fernandez. Alan Fernandez Alan Doloreseached his weight goal of 200lbs. He has a new goal and would like to weigh 175. He states his blood pressure has been good and he met with the dietitian last week.      Expected Outcomes Alan Fernandez continues to follow nutrition and exercise guidelines and maintain medication compliance working towards a healthier lifestyle Short term:Alan Fernandez is going to continue to exercise and eat healthy. Long Term: Alan Fernandez Alan Fernandez to lose another 25lbs. Alan Fernandez Alan Dolorescontinue to exercise to obtain his weightloss goal of 175lbs         Core Components/Risk Factors/Patient Goals at Discharge (Final Review):      Goals and Risk Factor Review - 08/26/16 1745      Core Components/Risk Factors/Patient Goals Review   Personal Goals Review Weight Management/Obesity;Hypertension;Lipids   Review Alan Fernandez Alan Doloreseached his weight goal of  200lbs. He has a new goal and would like to weigh 175. He states his blood pressure has been good and he met with the dietitian last week.   Expected Outcomes Short term:Alan Fernandez is going to continue to exercise and eat  healthy. Long Term: Alan Fernandez wants to lose another 25lbs. Alan Fernandez will continue to exercise to obtain his weightloss goal of 175lbs      ITP Comments:     ITP Comments    Row Name 06/28/16 1307 07/05/16 1753 07/14/16 0933 07/22/16 1720 08/11/16 1225   ITP Comments Medical review completed today. ITP created. Documentation of diagnosis can be found in [facility] 05/04/2016  First full day of exercise!  Alan Fernandez was oriented to gym and equipment including functions, settings, policies, and procedures.  Alan Fernandez's individual exercise prescription and treatment plan were reviewed.  All starting workloads were established based on the results of the 6 minute walk test done at initial orientation visit.  The plan for exercise progression was also introduced and progression will be customized based on patient's performance and goals. 30 day review. Continue with ITP unless directed changes per Medical Director review  New to program Home exercise discussed today  Will use ellipitical and walk at home 30 day review. Continue with ITP unless directed changes per Medical Director review      Comments: Discharge ITP

## 2016-09-06 NOTE — Progress Notes (Signed)
Daily Session Note  Patient Details  Name: Alan Fernandez MRN: [id number] Date of Birth: 1956-02-22 Referring Provider:     Cardiac Rehab from 06/28/2016 in [facility]  Referring Provider  End, Harrell Gave MD      Encounter Date: 09/06/2016  Check In:     Session Check In - 09/06/16 1736      Check-In   Location [facility]-Cardiac & Pulmonary Rehab   Staff Present Earlean Shawl, BS, ACSM CEP, Exercise Physiologist;Meredith Sherryll Burger, RN Vickki Hearing, BA, ACSM CEP, Exercise Physiologist   Supervising physician immediately available to respond to emergencies See telemetry face sheet for immediately available ER MD   Medication changes reported     No   Fall or balance concerns reported    No   Warm-up and Cool-down Performed on first and last piece of equipment   Resistance Training Performed Yes   VAD Patient? No     Pain Assessment   Currently in Pain? No/denies   Multiple Pain Sites No         History  Smoking Status  . Former Smoker  . Packs/day: 2.00  . Years: 34.00  . Quit date: 12/05/2004  Smokeless Tobacco  . Never Used    Comment: Quit in 2007    Goals Met:  Independence with exercise equipment Exercise tolerated well No report of cardiac concerns or symptoms Strength training completed today  Goals Unmet:  Not Applicable  Comments:  [name] graduated today from cardiac rehab with 36 sessions completed.  Details of the patient's exercise prescription and what He needs to do in order to continue the prescription and progress were discussed with patient.  Patient was given a copy of prescription and goals.  Patient verbalized understanding.  [name] plans to continue to exercise by using equipment at home.    Dr. Emily Filbert is Medical Director for [place] and [facility].

## 2016-09-06 NOTE — Patient Instructions (Signed)
Discharge Instructions  Patient Details  Name: Alan Fernandez MRN: [id number] Date of Birth: 1955-03-01 Referring Provider:  Nelva Bush, MD   Number of Visits: 8  Reason for Discharge:  Patient reached a stable level of exercise. Patient independent in their exercise.  Smoking History:  History  Smoking Status  . Former Smoker  . Packs/day: 2.00  . Years: 34.00  . Quit date: 12/05/2004  Smokeless Tobacco  . Never Used    Comment: Quit in 2007    Diagnosis:  NSTEMI (non-ST elevated myocardial infarction) ([place])  S/P CABG x 4  Initial Exercise Prescription:     Initial Exercise Prescription - 06/28/16 1500      Date of Initial Exercise RX and Referring Provider   Date 06/28/16   Referring Provider End, Christopher MD     Treadmill   MPH 2.5   Grade 0.5   Minutes 15   METs 3.09     NuStep   Level 3   SPM 80   Minutes 15   METs 2     Recumbant Elliptical   Level 2   RPM 50   Minutes 15   METs 2     Prescription Details   Frequency (times per week) 3   Duration Progress to 45 minutes of aerobic exercise without signs/symptoms of physical distress     Intensity   THRR 40-80% of Max Heartrate 98-139   Ratings of Perceived Exertion 11-13   Perceived Dyspnea 0-4     Progression   Progression Continue to progress workloads to maintain intensity without signs/symptoms of physical distress.     Resistance Training   Training Prescription Yes   Weight 4 lbs   Reps 10-15      Discharge Exercise Prescription (Final Exercise Prescription Changes):     Exercise Prescription Changes - 09/03/16 1300      Response to Exercise   Blood Pressure (Admit) 100/52   Blood Pressure (Exercise) 144/80   Blood Pressure (Exit) 110/58   Heart Rate (Admit) 37 bpm   Heart Rate (Exercise) 99 bpm   Heart Rate (Exit) 65 bpm   Rating of Perceived Exertion (Exercise) 15   Symptoms none   Duration Continue with 45 min of aerobic exercise without signs/symptoms  of physical distress.   Intensity THRR unchanged     Progression   Progression Continue to progress workloads to maintain intensity without signs/symptoms of physical distress.   Average METs 4.12     Resistance Training   Training Prescription Yes   Weight 4 lbs   Reps 10-15     Treadmill   MPH 3   Grade 2   Minutes 15   METs 4.12      Functional Capacity:     6 Minute Walk    Row Name 06/28/16 1526 09/02/16 1732       6 Minute Walk   Phase Initial Discharge    Distance 1425 feet 1700 feet    Distance % Change  - 19 %    Walk Time 6 minutes 6 minutes    # of Rest Breaks 0 0    MPH 2 3.22    METS 3.42 4    RPE 11 14    VO2 Peak 11.99 14.02    Symptoms No No    Resting HR 58 bpm 59 bpm    Resting BP 118/62 100/52    Max Ex. HR 81 bpm 87 bpm    Max Ex. BP 142/60  144/80       Quality of Life:     Quality of Life - 06/28/16 1319      Quality of Life Scores   Health/Function Pre 21.6 %   Socioeconomic Pre 26.57 %   Psych/Spiritual Pre 26.57 %   Family Pre 25.2 %   GLOBAL Pre 24.18 %      Personal Goals: Goals established at orientation with interventions provided to work toward goal.     Personal Goals and Risk Factors at Admission - 06/28/16 1322      Core Components/Risk Factors/Patient Goals on Admission    Weight Management Yes;Obesity;Weight Loss   Intervention Weight Management: Develop a combined nutrition and exercise program designed to reach desired caloric intake, while maintaining appropriate intake of nutrient and fiber, sodium and fats, and appropriate energy expenditure required for the weight goal.;Weight Management: Provide education and appropriate resources to help participant work on and attain dietary goals.;Weight Management/Obesity: Establish reasonable short term and long term weight goals.;Obesity: Provide education and appropriate resources to help participant work on and attain dietary goals.  Has lost 20 pounds since surgery    Admit Weight 204 lb 14.4 oz (92.9 kg)   Goal Weight: Short Term 200 lb (90.7 kg)   Goal Weight: Long Term 170 lb (77.1 kg)   Expected Outcomes Short Term: Continue to assess and modify interventions until short term weight is achieved;Long Term: Adherence to nutrition and physical activity/exercise program aimed toward attainment of established weight goal;Weight Loss: Understanding of general recommendations for a balanced deficit meal plan, which promotes 1-2 lb weight loss per week and includes a negative energy balance of [phone] kcal/d;Understanding recommendations for meals to include 15-35% energy as protein, 25-35% energy from fat, 35-60% energy from carbohydrates, less than 25m of dietary cholesterol, 20-35 gm of total fiber daily;Understanding of distribution of calorie intake throughout the day with the consumption of 4-5 meals/snacks   Hypertension Yes   Intervention Provide education on lifestyle modifcations including regular physical activity/exercise, weight management, moderate sodium restriction and increased consumption of fresh fruit, vegetables, and low fat dairy, alcohol moderation, and smoking cessation.;Monitor prescription use compliance.   Expected Outcomes Short Term: Continued assessment and intervention until BP is < 140/920mHG in hypertensive participants. < 130/8052mG in hypertensive participants with diabetes, heart failure or chronic kidney disease.;Long Term: Maintenance of blood pressure at goal levels.   Lipids Yes   Intervention Provide education and support for participant on nutrition & aerobic/resistive exercise along with prescribed medications to achieve LDL <31m8mDL >40mg42mExpected Outcomes Short Term: Participant states understanding of desired cholesterol values and is compliant with medications prescribed. Participant is following exercise prescription and nutrition guidelines.;Long Term: Cholesterol controlled with medications as prescribed, with  individualized exercise RX and with personalized nutrition plan. Value goals: LDL < 31mg,80m > 40 mg.   Stress Yes   Intervention Offer individual and/or small group education and counseling on adjustment to heart disease, stress management and health-related lifestyle change. Teach and support self-help strategies.;Refer participants experiencing significant psychosocial distress to appropriate mental health specialists for further evaluation and treatment. When possible, include family members and significant others in education/counseling sessions.   Expected Outcomes Short Term: Participant demonstrates changes in health-related behavior, relaxation and other stress management skills, ability to obtain effective social support, and compliance with psychotropic medications if prescribed.;Long Term: Emotional wellbeing is indicated by absence of clinically significant psychosocial distress or social isolation.       Personal  Goals Discharge:     Goals and Risk Factor Review - 08/26/16 1745      Core Components/Risk Factors/Patient Goals Review   Personal Goals Review Weight Management/Obesity;Hypertension;Lipids   Review Alan Fernandez has reached his weight goal of 200lbs. He has a new goal and would like to weigh 175. He states his blood pressure has been good and he met with the dietitian last week.   Expected Outcomes Short term:Alan Fernandez is going to continue to exercise and eat healthy. Long Term: Alan Fernandez wants to lose another 25lbs. Alan Fernandez will continue to exercise to obtain his weightloss goal of 175lbs      Nutrition & Weight - Outcomes:     Pre Biometrics - 06/28/16 1530      Pre Biometrics   Height 5' 10" (1.778 m)   Weight 203 lb 14.4 oz (92.5 kg)   Waist Circumference 44.25 inches   Hip Circumference 42.5 inches   Waist to Hip Ratio 1.04 %   BMI (Calculated) 29.3   Single Leg Stand 30 seconds         Post Biometrics - 09/02/16 1734       Post  Biometrics   Height 5' 10" (1.778  m)   Weight 201 lb (91.2 kg)   Waist Circumference 40.5 inches   Hip Circumference 40 inches   Waist to Hip Ratio 1.01 %   BMI (Calculated) 28.9   Single Leg Stand 30 seconds      Nutrition:     Nutrition Therapy & Goals - 08/18/16 1601      Nutrition Therapy   Diet TLC   Drug/Food Interactions Statins/Certain Fruits   Protein (specify units) 8oz   Fruits and Vegetables 8 servings/day   Sodium 1500 grams     Personal Nutrition Goals   Nutrition Goal keep close to 1500 calories daily for weight loss   Personal Goal #2 maintain your healthy food choices and eating pattern   Personal Goal #3 Continue to gradually increase exercise as able.   Comments Alan Fernandez has made significant diet changes since having heart surgery. He would like to lose more weight, and will continue with his efforts to do so.      Intervention Plan   Intervention Nutrition handout(s) given to patient.   Expected Outcomes Short Term Goal: A plan has been developed with personal nutrition goals set during dietitian appointment.;Long Term Goal: Adherence to prescribed nutrition plan.;Short Term Goal: Understand basic principles of dietary content, such as calories, fat, sodium, cholesterol and nutrients.      Nutrition Discharge:     Nutrition Assessments - 09/06/16 1623      MEDFICTS Scores   Post Score 3      Education Questionnaire Score:     Knowledge Questionnaire Score - 09/06/16 1622      Knowledge Questionnaire Score   Post Score 28      Goals reviewed with patient; copy given to patient.

## 2016-09-08 ENCOUNTER — Encounter: Payer: Self-pay | Admitting: *Deleted

## 2016-09-08 DIAGNOSIS — Z951 Presence of aortocoronary bypass graft: Secondary | ICD-10-CM

## 2016-09-08 NOTE — Progress Notes (Signed)
Cardiac Individual Treatment Plan  Patient Details  Name: YOLANDA DOCKENDORF MRN: [id number] Date of Birth: [birth year] Referring Provider:     Cardiac Rehab from 06/28/2016 in [facility] Cardiac and Pulmonary Rehab  Referring Provider  End, Harrell Gave MD      Initial Encounter Date:    Cardiac Rehab from 06/28/2016 in [facility] Cardiac and Pulmonary Rehab  Date  06/28/16  Referring Provider  End, Harrell Gave MD      Visit Diagnosis: S/P CABG x 4  Patient's Home Medications on Admission:  Current Outpatient Prescriptions:  .  aspirin EC 325 MG EC tablet, Take 1 tablet (325 mg total) by mouth daily., Disp: 30 tablet, Rfl: 0 .  atorvastatin (LIPITOR) 40 MG tablet, Take 1 tablet (40 mg total) by mouth daily at 6 PM., Disp: 90 tablet, Rfl: 2 .  clobetasol (TEMOVATE) 0.05 % external solution, Apply 1 application topically as needed. Rash, Disp: , Rfl:  .  lisinopril (PRINIVIL,ZESTRIL) 2.5 MG tablet, Take 1 tablet (2.5 mg total) by mouth daily., Disp: 90 tablet, Rfl: 0 .  metoprolol tartrate (LOPRESSOR) 25 MG tablet, Take 1 tablet (25 mg total) by mouth 2 (two) times daily., Disp: 180 tablet, Rfl: 0 .  montelukast (SINGULAIR) 10 MG tablet, Take 1 tablet by mouth daily. At bedtime., Disp: , Rfl:  .  VENTOLIN HFA 108 (90 BASE) MCG/ACT inhaler, Inhale 2 puffs into the lungs every 4 (four) hours as needed. , Disp: , Rfl:   Past Medical History: Past Medical History:  Diagnosis Date  . Asthma   . CAD (coronary artery disease)    a. 04/2016 NSTEMI/Cath: LM 20/90, LAD 40p, 12m LCX 60ost, 764mRCA 40p, 3068mPDA 70, EF 55-65%;  b. 04/2016 s/p CABG x 2: LIMA->LAD, VG->RPL, VG->OM1->OM3.  . Diastolic dysfunction    a. 04/2016 Echo: EF 60-65%, grade 1 DD, mildly to mod dil RA.  . EMarland Kitchenzema   . Edema extremities   . GERD (gastroesophageal reflux disease)   . Hemorrhoid   . Pneumonia Feb 2016    Tobacco Use: History  Smoking Status  . Former Smoker  . Packs/day: 2.00  . Years: 34.00  . Quit date: 12/05/2004   Smokeless Tobacco  . Never Used    Comment: Quit in 2007    Labs: Recent Review Flowsheet Data    Labs for ITP Cardiac and Pulmonary Rehab Latest Ref Rng & Units 05/05/2016 05/05/2016 05/05/2016 05/06/2016 07/02/2016   Cholestrol 0 - 200 mg/dL - - - - 100   LDLCALC 0 - 99 mg/dL - - - - 52   HDL >40 mg/dL - - - - 28(L)   Trlycerides <150 mg/dL - - - - 100   Hemoglobin A1c 4.8 - 5.6 % - - - - -   PHART 7.350 - 7.450 7.345(L) 7.332(L) - - -   PCO2ART 32.0 - 48.0 mmHg 44.9 42.1 - - -   HCO3 20.0 - 28.0 mmol/L 24.8 22.5 - - -   TCO2 0 - 100 mmol/L 26 24 24 28  -   ACIDBASEDEF 0.0 - 2.0 mmol/L 1.0 3.0(H) - - -   O2SAT % 96.0 92.0 - - -       Exercise Target Goals:    Exercise Program Goal: Individual exercise prescription set with THRR, safety & activity barriers. Participant demonstrates ability to understand and report RPE using BORG scale, to self-measure pulse accurately, and to acknowledge the importance of the exercise prescription.  Exercise Prescription Goal: Starting with aerobic activity 30 plus  minutes a day, 3 days per week for initial exercise prescription. Provide home exercise prescription and guidelines that participant acknowledges understanding prior to discharge.  Activity Barriers & Risk Stratification:     Activity Barriers & Cardiac Risk Stratification - 06/28/16 1323      Activity Barriers & Cardiac Risk Stratification   Activity Barriers None  Left hip will hurt some with walking,resolves when he rests. Advised he talk to his MD about this symptom.    Cardiac Risk Stratification High      6 Minute Walk:     6 Minute Walk    Row Name 06/28/16 1526 09/02/16 1732       6 Minute Walk   Phase Initial Discharge    Distance 1425 feet 1700 feet    Distance % Change  - 19 %    Walk Time 6 minutes 6 minutes    # of Rest Breaks 0 0    MPH 2 3.22    METS 3.42 4    RPE 11 14    VO2 Peak 11.99 14.02    Symptoms No No    Resting HR 58 bpm 59 bpm     Resting BP 118/62 100/52    Max Ex. HR 81 bpm 87 bpm    Max Ex. BP 142/60 144/80       Oxygen Initial Assessment:   Oxygen Re-Evaluation:   Oxygen Discharge (Final Oxygen Re-Evaluation):   Initial Exercise Prescription:     Initial Exercise Prescription - 06/28/16 1500      Date of Initial Exercise RX and Referring Provider   Date 06/28/16   Referring Provider End, Harrell Gave MD     Treadmill   MPH 2.5   Grade 0.5   Minutes 15   METs 3.09     NuStep   Level 3   SPM 80   Minutes 15   METs 2     Recumbant Elliptical   Level 2   RPM 50   Minutes 15   METs 2     Prescription Details   Frequency (times per week) 3   Duration Progress to 45 minutes of aerobic exercise without signs/symptoms of physical distress     Intensity   THRR 40-80% of Max Heartrate 98-139   Ratings of Perceived Exertion 11-13   Perceived Dyspnea 0-4     Progression   Progression Continue to progress workloads to maintain intensity without signs/symptoms of physical distress.     Resistance Training   Training Prescription Yes   Weight 4 lbs   Reps 10-15      Perform Capillary Blood Glucose checks as needed.  Exercise Prescription Changes:     Exercise Prescription Changes    Row Name 06/28/16 1500 07/07/16 1400 07/22/16 1100 08/05/16 1500 08/18/16 1500     Response to Exercise   Blood Pressure (Admit) 118/62 104/62 110/60 110/56 122/68   Blood Pressure (Exercise) 142/60 144/72 142/82 114/58 136/66   Blood Pressure (Exit)  - 100/60 116/64  - 104/62   Heart Rate (Admit) 58 bpm 69 bpm 58 bpm 63 bpm 58 bpm   Heart Rate (Exercise) 81 bpm 92 bpm 88 bpm 94 bpm 103 bpm   Heart Rate (Exit)  - 72 bpm 63 bpm 66 bpm 64 bpm   Oxygen Saturation (Admit) 97 %  -  -  -  -   Rating of Perceived Exertion (Exercise) 11 12 13 12 13    Symptoms none  -  -  -  none   Comments walk test results  -  -  -  -   Duration  -  -  -  - Continue with 45 min of aerobic exercise without signs/symptoms of  physical distress.   Intensity  -  -  -  - THRR unchanged     Progression   Progression  -  -  -  - Continue to progress workloads to maintain intensity without signs/symptoms of physical distress.   Average METs  -  - 3.25  - 3.5     Resistance Training   Training Prescription  - No Yes Yes Yes   Weight  - 4 4 4 4  lbs   Reps  - 10-15 10-15 10-15 10-15     Interval Training   Interval Training  -  -  -  - No     Treadmill   MPH  - 2.5 2.5 3 3    Grade  - 0.5 0.5 0.5 0.5   Minutes  - 15 15 15 15    METs  - 3.09 3.09 3.5 3.5     NuStep   Level  -  - 4 5 6    SPM  -  - 80 80  -   Minutes  -  - 15 15 15    METs  -  - 3.5 3.8 3.5     Elliptical   Level  -  -  -  - 5   Speed  -  -  -  - 3.5   Minutes  -  -  -  - 15     Home Exercise Plan   Plans to continue exercise at  -  -  -  - Home (comment)  walking and elliptical   Frequency  -  -  -  - Add 2 additional days to program exercise sessions.   [place] Name 09/03/16 1300             Response to Exercise   Blood Pressure (Admit) 100/52       Blood Pressure (Exercise) 144/80       Blood Pressure (Exit) 110/58       Heart Rate (Admit) 37 bpm       Heart Rate (Exercise) 99 bpm       Heart Rate (Exit) 65 bpm       Rating of Perceived Exertion (Exercise) 15       Symptoms none       Duration Continue with 45 min of aerobic exercise without signs/symptoms of physical distress.       Intensity THRR unchanged         Progression   Progression Continue to progress workloads to maintain intensity without signs/symptoms of physical distress.       Average METs 4.12         Resistance Training   Training Prescription Yes       Weight 4 lbs       Reps 10-15         Treadmill   MPH 3       Grade 2       Minutes 15       METs 4.12          Exercise Comments:     Exercise Comments    Row Name 07/05/16 1753 07/07/16 1423 07/22/16 1158 08/05/16 1526 09/02/16 1738   Exercise Comments  First full day of exercise!  [name] was  oriented to gym  and equipment including functions, settings, policies, and procedures.  lee's individual exercise prescription and treatment plan were reviewed.  All starting workloads were established based on the results of the 6 minute walk test done at initial orientation visit.  The plan for exercise progression was also introduced and progression will be customized based on patient's performance and goals. Tommy completed first full day of exercise with no issues.  Staff reviewed safyet, THR and RPE. Konrad Dolores is tolerating exercise well and has not had any trouble with strength or cardio. Konrad Dolores is progressing well with exrecise.  Staff will continue to monitor.  He has switched to upright ellipitcal instead of recumbent. 6 min walk done today. Results reviewed with patient. See 6 min walk data for detailed report.    Row Name 09/06/16 1738           Exercise Comments  Nissim graduated today from cardiac rehab with 36 sessions completed.  Details of the patient's exercise prescription and what He needs to do in order to continue the prescription and progress were discussed with patient.  Patient was given a copy of prescription and goals.  Patient verbalized understanding.  Gary plans to continue to exercise by using equipment at home.          Exercise Goals and Review:     Exercise Goals    Row Name 06/28/16 1530             Exercise Goals   Increase Physical Activity Yes       Intervention Provide advice, education, support and counseling about physical activity/exercise needs.;Develop an individualized exercise prescription for aerobic and resistive training based on initial evaluation findings, risk stratification, comorbidities and participant's personal goals.       Expected Outcomes Achievement of increased cardiorespiratory fitness and enhanced flexibility, muscular endurance and strength shown through measurements of functional capacity and personal statement of participant.        Increase Strength and Stamina Yes       Intervention Provide advice, education, support and counseling about physical activity/exercise needs.;Develop an individualized exercise prescription for aerobic and resistive training based on initial evaluation findings, risk stratification, comorbidities and participant's personal goals.       Expected Outcomes Achievement of increased cardiorespiratory fitness and enhanced flexibility, muscular endurance and strength shown through measurements of functional capacity and personal statement of participant.          Exercise Goals Re-Evaluation :     Exercise Goals Re-Evaluation    Row Name 08/18/16 1502 09/03/16 1311           Exercise Goal Re-Evaluation   Exercise Goals Review Increase Strenth and Stamina;Increase Physical Activity Increase Physical Activity;Increase Strenth and Stamina      Comments Konrad Dolores is doing well in rehab.  He is up to level 5 on the elliptical now!  We will continue to monitor his progression. Konrad Dolores has improved overall fitness and will graduate Monday.      Expected Outcomes Short: Add more incline to treadmill.  Long: Continue to make exercise part of regular routine. Konrad Dolores will graduate and continue to exercise on his own.         Discharge Exercise Prescription (Final Exercise Prescription Changes):     Exercise Prescription Changes - 09/03/16 1300      Response to Exercise   Blood Pressure (Admit) 100/52   Blood Pressure (Exercise) 144/80   Blood Pressure (Exit) 110/58   Heart Rate (Admit) 37 bpm   Heart  Rate (Exercise) 99 bpm   Heart Rate (Exit) 65 bpm   Rating of Perceived Exertion (Exercise) 15   Symptoms none   Duration Continue with 45 min of aerobic exercise without signs/symptoms of physical distress.   Intensity THRR unchanged     Progression   Progression Continue to progress workloads to maintain intensity without signs/symptoms of physical distress.   Average METs 4.12     Resistance  Training   Training Prescription Yes   Weight 4 lbs   Reps 10-15     Treadmill   MPH 3   Grade 2   Minutes 15   METs 4.12      Nutrition:  Target Goals: Understanding of nutrition guidelines, daily intake of sodium <1552m, cholesterol <2030m calories 30% from fat and 7% or less from saturated fats, daily to have 5 or more servings of fruits and vegetables.  Biometrics:     Pre Biometrics - 06/28/16 1530      Pre Biometrics   Height 5' 10"  (1.778 m)   Weight 203 lb 14.4 oz (92.5 kg)   Waist Circumference 44.25 inches   Hip Circumference 42.5 inches   Waist to Hip Ratio 1.04 %   BMI (Calculated) 29.3   Single Leg Stand 30 seconds         Post Biometrics - 09/02/16 1734       Post  Biometrics   Height 5' 10"  (1.778 m)   Weight 201 lb (91.2 kg)   Waist Circumference 40.5 inches   Hip Circumference 40 inches   Waist to Hip Ratio 1.01 %   BMI (Calculated) 28.9   Single Leg Stand 30 seconds      Nutrition Therapy Plan and Nutrition Goals:     Nutrition Therapy & Goals - 08/18/16 1601      Nutrition Therapy   Diet TLC   Drug/Food Interactions Statins/Certain Fruits   Protein (specify units) 8oz   Fruits and Vegetables 8 servings/day   Sodium 1500 grams     Personal Nutrition Goals   Nutrition Goal keep close to 1500 calories daily for weight loss   Personal Goal #2 maintain your healthy food choices and eating pattern   Personal Goal #3 Continue to gradually increase exercise as able.   Comments Mr. OaBowenas made significant diet changes since having heart surgery. He would like to lose more weight, and will continue with his efforts to do so.      Intervention Plan   Intervention Nutrition handout(s) given to patient.   Expected Outcomes Short Term Goal: A plan has been developed with personal nutrition goals set during dietitian appointment.;Long Term Goal: Adherence to prescribed nutrition plan.;Short Term Goal: Understand basic principles of dietary  content, such as calories, fat, sodium, cholesterol and nutrients.      Nutrition Discharge: Rate Your Plate Scores:     Nutrition Assessments - 09/06/16 1623      MEDFICTS Scores   Post Score 3      Nutrition Goals Re-Evaluation:   Nutrition Goals Discharge (Final Nutrition Goals Re-Evaluation):   Psychosocial: Target Goals: Acknowledge presence or absence of significant depression and/or stress, maximize coping skills, provide positive support system. Participant is able to verbalize types and ability to use techniques and skills needed for reducing stress and depression.   Initial Review & Psychosocial Screening:     Initial Psych Review & Screening - 06/28/16 1317      Initial Review   Current issues with None Identified  Is concerned about overdoing it after having open heart surgery. Is 8 weeks out form surgery and is noticing incerased ability to perform daily activities.      Family Dynamics   Good Support System? Yes     Barriers   Psychosocial barriers to participate in program There are no identifiable barriers or psychosocial needs.;The patient should benefit from training in stress management and relaxation.     Screening Interventions   Interventions Encouraged to exercise;Provide feedback about the scores to participant;To provide support and resources with identified psychosocial needs  Work stress is only identified stress.       Quality of Life Scores:      Quality of Life - 06/28/16 1319      Quality of Life Scores   Health/Function Pre 21.6 %   Socioeconomic Pre 26.57 %   Psych/Spiritual Pre 26.57 %   Family Pre 25.2 %   GLOBAL Pre 24.18 %      PHQ-9: Recent Review Flowsheet Data    Depression screen [facility] 2/9 09/06/2016 06/28/2016   Decreased Interest 0 0   Down, Depressed, Hopeless 0 0   PHQ - 2 Score 0 0   Altered sleeping 0 1   Tired, decreased energy 0 1   Change in appetite 0 0   Feeling bad or failure about yourself  0 0    Trouble concentrating 0 0   Moving slowly or fidgety/restless 0 0   Suicidal thoughts 0 0   PHQ-9 Score 0 2   Difficult doing work/chores - Not difficult at all     Interpretation of Total Score  Total Score Depression Severity:  1-4 = Minimal depression, 5-9 = Mild depression, 10-14 = Moderate depression, 15-19 = Moderately severe depression, 20-27 = Severe depression   Psychosocial Evaluation and Intervention:     Psychosocial Evaluation - 08/02/16 1657      Psychosocial Evaluation & Interventions   Expected Outcomes Tommy will exercise consistently to achieve his stated goals.  He will benefit from the psychoeducational components of this program to learn positive ways to cope with stress in his life.  He will also benefit from meeting with the dietician to address his weight loss goals.        Psychosocial Re-Evaluation:     Psychosocial Re-Evaluation    [place] Name 08/02/16 1640 08/02/16 1656           Psychosocial Re-Evaluation   Current issues with None Identified  -      Comments  - Tommy continues without significant stress.HAS seen great increase in exercise levels since started.  He continued to exerciselast week while on vacation      Expected Outcomes Tommy will exercise consistently to achieve his stated goals.  He will benefit from the psychoeducational components of this program to learn positive ways to cope with stress in his life.  He will also benefit from meeting with the dietician to address his weight loss goals.    -      Interventions Stress management education;Encouraged to attend Cardiac Rehabilitation for the exercise;Relaxation education  -      Continue Psychosocial Services  Follow up required by staff  -         Psychosocial Discharge (Final Psychosocial Re-Evaluation):     Psychosocial Re-Evaluation - 08/02/16 1656      Psychosocial Re-Evaluation   Comments Konrad Dolores continues without significant stress.HAS seen great increase in exercise levels  since started.  He continued to exerciselast week  while on vacation      Vocational Rehabilitation: Provide vocational rehab assistance to qualifying candidates.   Vocational Rehab Evaluation & Intervention:     Vocational Rehab - 06/28/16 1322      Initial Vocational Rehab Evaluation & Intervention   Assessment shows need for Vocational Rehabilitation No      Education: Education Goals: Education classes will be provided on a weekly basis, covering required topics. Participant will state understanding/return demonstration of topics presented.  Learning Barriers/Preferences:     Learning Barriers/Preferences - 06/28/16 1320      Learning Barriers/Preferences   Learning Barriers None   Learning Preferences None      Education Topics: General Nutrition Guidelines/Fats and Fiber: -Group instruction provided by verbal, written material, models and posters to present the general guidelines for heart healthy nutrition. Gives an explanation and review of dietary fats and fiber.   Cardiac Rehab from 09/06/2016 in [facility] Cardiac and Pulmonary Rehab  Date  08/16/16  Educator  PI  Instruction Review Code  2- meets goals/outcomes      Controlling Sodium/Reading Food Labels: -Group verbal and written material supporting the discussion of sodium use in heart healthy nutrition. Review and explanation with models, verbal and written materials for utilization of the food label.   Cardiac Rehab from 09/06/2016 in [facility] Cardiac and Pulmonary Rehab  Date  08/23/16  Educator  PI  Instruction Review Code  2- meets goals/outcomes      Exercise Physiology & Risk Factors: - Group verbal and written instruction with models to review the exercise physiology of the cardiovascular system and associated critical values. Details cardiovascular disease risk factors and the goals associated with each risk factor.   Cardiac Rehab from 09/06/2016 in [facility] Cardiac and Pulmonary Rehab  Date  08/30/16   Educator  [facility]  Instruction Review Code  2- meets goals/outcomes      Aerobic Exercise & Resistance Training: - Gives group verbal and written discussion on the health impact of inactivity. On the components of aerobic and resistive training programs and the benefits of this training and how to safely progress through these programs.   Cardiac Rehab from 09/06/2016 in [facility] Cardiac and Pulmonary Rehab  Date  09/01/16  Educator  AS  Instruction Review Code  2- meets goals/outcomes      Flexibility, Balance, General Exercise Guidelines: - Provides group verbal and written instruction on the benefits of flexibility and balance training programs. Provides general exercise guidelines with specific guidelines to those with heart or lung disease. Demonstration and skill practice provided.   Cardiac Rehab from 09/06/2016 in [facility] Cardiac and Pulmonary Rehab  Date  09/06/16  Educator  AS  Instruction Review Code  2- meets goals/outcomes      Stress Management: - Provides group verbal and written instruction about the health risks of elevated stress, cause of high stress, and healthy ways to reduce stress.   Cardiac Rehab from 09/06/2016 in [facility] Cardiac and Pulmonary Rehab  Date  07/21/16  Educator  [facility]  Instruction Review Code  2- meets goals/outcomes      Depression: - Provides group verbal and written instruction on the correlation between heart/lung disease and depressed mood, treatment options, and the stigmas associated with seeking treatment.   Cardiac Rehab from 09/06/2016 in [facility] Cardiac and Pulmonary Rehab  Date  08/18/16  Educator  Lucianne Lei, MSW  Instruction Review Code  2- meets goals/outcomes      Anatomy & Physiology of the Heart: - Group verbal  and written instruction and models provide basic cardiac anatomy and physiology, with the coronary electrical and arterial systems. Review of: AMI, Angina, Valve disease, Heart Failure, Cardiac Arrhythmia, Pacemakers, and the  ICD.   Cardiac Procedures: - Group verbal and written instruction and models to describe the testing methods done to diagnose heart disease. Reviews the outcomes of the test results. Describes the treatment choices: Medical Management, Angioplasty, or Coronary Bypass Surgery.   Cardiac Medications: - Group verbal and written instruction to review commonly prescribed medications for heart disease. Reviews the medication, class of the drug, and side effects. Includes the steps to properly store meds and maintain the prescription regimen.   Cardiac Rehab from 09/06/2016 in [facility] Cardiac and Pulmonary Rehab  Date  08/04/16 Marisue Humble 1]  Educator  CE [Part 2 meds]  Instruction Review Code  2- meets goals/outcomes      Go Sex-Intimacy & Heart Disease, Get SMART - Goal Setting: - Group verbal and written instruction through game format to discuss heart disease and the return to sexual intimacy. Provides group verbal and written material to discuss and apply goal setting through the application of the S.M.A.R.T. Method.   Other Matters of the Heart: - Provides group verbal, written materials and models to describe Heart Failure, Angina, Valve Disease, and Diabetes in the realm of heart disease. Includes description of the disease process and treatment options available to the cardiac patient.   Exercise & Equipment Safety: - Individual verbal instruction and demonstration of equipment use and safety with use of the equipment.   Cardiac Rehab from 09/06/2016 in [facility] Cardiac and Pulmonary Rehab  Date  06/28/16  Educator  Sb  Instruction Review Code  2- meets goals/outcomes      Infection Prevention: - Provides verbal and written material to individual with discussion of infection control including proper hand washing and proper equipment cleaning during exercise session.   Cardiac Rehab from 09/06/2016 in [facility] Cardiac and Pulmonary Rehab  Date  06/28/16  Educator  Sb  Instruction Review Code  2-  meets goals/outcomes      Falls Prevention: - Provides verbal and written material to individual with discussion of falls prevention and safety.   Cardiac Rehab from 09/06/2016 in [facility] Cardiac and Pulmonary Rehab  Date  06/28/16  Educator  SB  Instruction Review Code  2- meets goals/outcomes      Diabetes: - Individual verbal and written instruction to review signs/symptoms of diabetes, desired ranges of glucose level fasting, after meals and with exercise. Advice that pre and post exercise glucose checks will be done for 3 sessions at entry of program.    Knowledge Questionnaire Score:     Knowledge Questionnaire Score - 09/06/16 1622      Knowledge Questionnaire Score   Post Score 28      Core Components/Risk Factors/Patient Goals at Admission:     Personal Goals and Risk Factors at Admission - 06/28/16 1322      Core Components/Risk Factors/Patient Goals on Admission    Weight Management Yes;Obesity;Weight Loss   Intervention Weight Management: Develop a combined nutrition and exercise program designed to reach desired caloric intake, while maintaining appropriate intake of nutrient and fiber, sodium and fats, and appropriate energy expenditure required for the weight goal.;Weight Management: Provide education and appropriate resources to help participant work on and attain dietary goals.;Weight Management/Obesity: Establish reasonable short term and long term weight goals.;Obesity: Provide education and appropriate resources to help participant work on and attain dietary goals.  Has  lost 20 pounds since surgery   Admit Weight 204 lb 14.4 oz (92.9 kg)   Goal Weight: Short Term 200 lb (90.7 kg)   Goal Weight: Long Term 170 lb (77.1 kg)   Expected Outcomes Short Term: Continue to assess and modify interventions until short term weight is achieved;Long Term: Adherence to nutrition and physical activity/exercise program aimed toward attainment of established weight goal;Weight  Loss: Understanding of general recommendations for a balanced deficit meal plan, which promotes 1-2 lb weight loss per week and includes a negative energy balance of [phone] kcal/d;Understanding recommendations for meals to include 15-35% energy as protein, 25-35% energy from fat, 35-60% energy from carbohydrates, less than 254m of dietary cholesterol, 20-35 gm of total fiber daily;Understanding of distribution of calorie intake throughout the day with the consumption of 4-5 meals/snacks   Hypertension Yes   Intervention Provide education on lifestyle modifcations including regular physical activity/exercise, weight management, moderate sodium restriction and increased consumption of fresh fruit, vegetables, and low fat dairy, alcohol moderation, and smoking cessation.;Monitor prescription use compliance.   Expected Outcomes Short Term: Continued assessment and intervention until BP is < 140/990mHG in hypertensive participants. < 130/8028mG in hypertensive participants with diabetes, heart failure or chronic kidney disease.;Long Term: Maintenance of blood pressure at goal levels.   Lipids Yes   Intervention Provide education and support for participant on nutrition & aerobic/resistive exercise along with prescribed medications to achieve LDL <79m74mDL >40mg32mExpected Outcomes Short Term: Participant states understanding of desired cholesterol values and is compliant with medications prescribed. Participant is following exercise prescription and nutrition guidelines.;Long Term: Cholesterol controlled with medications as prescribed, with individualized exercise RX and with personalized nutrition plan. Value goals: LDL < 79mg,37m > 40 mg.   Stress Yes   Intervention Offer individual and/or small group education and counseling on adjustment to heart disease, stress management and health-related lifestyle change. Teach and support self-help strategies.;Refer participants experiencing significant  psychosocial distress to appropriate mental health specialists for further evaluation and treatment. When possible, include family members and significant others in education/counseling sessions.   Expected Outcomes Short Term: Participant demonstrates changes in health-related behavior, relaxation and other stress management skills, ability to obtain effective social support, and compliance with psychotropic medications if prescribed.;Long Term: Emotional wellbeing is indicated by absence of clinically significant psychosocial distress or social isolation.      Core Components/Risk Factors/Patient Goals Review:      Goals and Risk Factor Review    Row Name 08/02/16 1702 08/26/16 1745           Core Components/Risk Factors/Patient Goals Review   Personal Goals Review Weight Management/Obesity;Hypertension;Lipids Weight Management/Obesity;Hypertension;Lipids      Review Tommy Konrad Doloreswn 2 pounds since starting the program. He would like to see the RD and we will make him an appointment. He had a week of vacation and did not gain any weight, even though he enjoyed his food.  Eating fruit and yogurt for breakfast. His BP and cholsetreol are in good range. His total cholesterol has dropped from 150 to 100 per Tommy. Tommy Konrad Doloreseached his weight goal of 200lbs. He has a new goal and would like to weigh 175. He states his blood pressure has been good and he met with the dietitian last week.      Expected Outcomes Tommy continues to follow nutrition and exercise guidelines and maintain medication compliance working towards a healthier lifestyle Short term:Tommy is going to continue to exercise and eat  healthy. Long Term: Konrad Dolores wants to lose another 25lbs. Konrad Dolores will continue to exercise to obtain his weightloss goal of 175lbs         Core Components/Risk Factors/Patient Goals at Discharge (Final Review):      Goals and Risk Factor Review - 08/26/16 1745      Core Components/Risk Factors/Patient Goals  Review   Personal Goals Review Weight Management/Obesity;Hypertension;Lipids   Review Konrad Dolores has reached his weight goal of 200lbs. He has a new goal and would like to weigh 175. He states his blood pressure has been good and he met with the dietitian last week.   Expected Outcomes Short term:Tommy is going to continue to exercise and eat healthy. Long Term: Konrad Dolores wants to lose another 25lbs. Konrad Dolores will continue to exercise to obtain his weightloss goal of 175lbs      ITP Comments:     ITP Comments    Row Name 06/28/16 1307 07/05/16 1753 07/14/16 0933 07/22/16 1720 08/11/16 3010   ITP Comments Medical review completed today. ITP created. Documentation of diagnosis can be found in [facility] 05/04/2016  First full day of exercise!  [name] was oriented to gym and equipment including functions, settings, policies, and procedures.  lee's individual exercise prescription and treatment plan were reviewed.  All starting workloads were established based on the results of the 6 minute walk test done at initial orientation visit.  The plan for exercise progression was also introduced and progression will be customized based on patient's performance and goals. 30 day review. Continue with ITP unless directed changes per Medical Director review  New to program Home exercise discussed today  Will use ellipitical and walk at home 30 day review. Continue with ITP unless directed changes per Medical Director review   [place] Name 09/08/16 0703           ITP Comments 30 day review. Continue with ITP unless directed changes per Medical Director review             Comments:

## 2016-09-21 ENCOUNTER — Ambulatory Visit (INDEPENDENT_AMBULATORY_CARE_PROVIDER_SITE_OTHER): Payer: 59 | Admitting: Internal Medicine

## 2016-09-21 ENCOUNTER — Encounter: Payer: Self-pay | Admitting: Internal Medicine

## 2016-09-21 VITALS — BP 108/52 | HR 65 | Ht 70.0 in | Wt 199.0 lb

## 2016-09-21 DIAGNOSIS — I25810 Atherosclerosis of coronary artery bypass graft(s) without angina pectoris: Secondary | ICD-10-CM | POA: Diagnosis not present

## 2016-09-21 DIAGNOSIS — E785 Hyperlipidemia, unspecified: Secondary | ICD-10-CM | POA: Diagnosis not present

## 2016-09-21 DIAGNOSIS — E871 Hypo-osmolality and hyponatremia: Secondary | ICD-10-CM | POA: Diagnosis not present

## 2016-09-21 DIAGNOSIS — D649 Anemia, unspecified: Secondary | ICD-10-CM | POA: Diagnosis not present

## 2016-09-21 DIAGNOSIS — I10 Essential (primary) hypertension: Secondary | ICD-10-CM | POA: Insufficient documentation

## 2016-09-21 MED ORDER — ASPIRIN EC 81 MG PO TBEC
81.0000 mg | DELAYED_RELEASE_TABLET | Freq: Every day | ORAL | Status: AC
Start: 2016-09-21 — End: ?

## 2016-09-21 NOTE — Patient Instructions (Signed)
Medication Instructions:  Your physician has recommended you make the following change in your medication:  1- STOP taking Lisinopril.   Labwork: Your physician recommends that you return for lab work in: TODAY (CBC, BMP).    Testing/Procedures: none  Follow-Up: Your physician recommends that you schedule a follow-up appointment in: 3-4 MONTHS WITH DR END.   If you need a refill on your cardiac medications before your next appointment, please call your pharmacy.

## 2016-09-21 NOTE — Progress Notes (Signed)
Follow-up Outpatient Visit Date: 09/21/2016  Primary Care Provider: Albina Billet, MD [address]   [place] [address]  Chief Complaint: Follow-up coronary artery disease  HPI:  Alan Fernandez is a 61 y.o. year-old male with history of coronary artery disease status post 4 vessel CABG in 04/2016, heart failure with preserved ejection fraction, asthma, and GERD, who presents for follow-up of coronary artery disease. He presented with chest pain to [facility] on 05/03/16 and was found to have an elevated troponin consistent with NSTEMI. Cardiac catheterization revealed three-vessel CAD including critical LMCA stenosis. Alan Fernandez was transferred to [facility] for urgent CABG. He was seen for follow-up by Ignacia Bayley, NP, on 05/27/16, at which time Alan Fernandez was recovering well from his surgery.  Today, Alan Fernandez reports that he continues to feel quite well, noticeably better than he had for months leading up to his NSTEMI and CABG in March. His only complaints are of occasional vague discomfort in the left upper chest, just below the collar bone. He notices a pressure almost as if someone is pushing on his chest wall that lasts for only a few seconds. It comes and goes periodically without any clear precipitants or associated symptoms. He has not had any exertional chest pain or shortness of breath. He exercises regularly on his elliptical trainer. He also walks almost every day. His other complaint is of an occasional cough, which he has attributed to lisinopril that was started at the time of his CABG. The cough is gradually improving, though he still has an intermitte slightly productive cough. He has stable lower extremity edema for which he uses compression stockings. He denies orthopnea, PND, palpitations, lightheadedness, and claudication.  --------------------------------------------------------------------------------------------------  Cardiovascular History & Procedures: Cardiovascular  Problems:  Coronary artery disease status post CABG  Heart failure with preserved ejection fraction  Risk Factors:  Known coronary artery disease, male gender, age greater than 67  Cath/PCI:  LHC (05/04/16): 20% proximal and 90% distal LMCA stenoses. If use 40% proximal and focal 50% mid LAD stenoses. 60% ostial LCx and 70% mid LCx lesions. Moderate-caliber ramus without significant disease. RCA with sequential 40% proximal and 30% mid vessel stenoses. RPDA with 70% mid vessel lesion. LVEF 55-65%. LVEDP 12 mmHg.  CV Surgery:  CABG (05/05/16, Dr. Cyndia Bent): LIMA to LAD, sequential SVG to OM1 and OM3, and SVG to RPL.  EP Procedures and Devices:  None  Non-Invasive Evaluation(s):  Carotid artery Dopplers (05/04/16): 1-39% bilateral internal carotid artery stenoses. Antegrade vertebral artery flow.  TTE (05/04/16): Normal LV size with mild LVH. LVEF 60-65%. Grade 1 diastolic dysfunction. Normal RV size and function. Mild to moderate right atrial enlargement. No significant valvular abnormalities.  Recent CV Pertinent Labs: Lab Results  Component Value Date   CHOL 100 07/02/2016   HDL 28 (L) 07/02/2016   LDLCALC 52 07/02/2016   TRIG 100 07/02/2016   CHOLHDL 3.6 07/02/2016   INR 1.37 05/05/2016   K 3.9 05/07/2016   MG 2.3 05/06/2016   BUN 10 05/07/2016   BUN 10 06/26/2014   CREATININE 0.97 05/07/2016    Past medical and surgical history were reviewed and updated in EPIC.  Current Meds  Medication Sig  . aspirin EC 325 MG EC tablet Take 1 tablet (325 mg total) by mouth daily.  Marland Kitchen atorvastatin (LIPITOR) 40 MG tablet Take 1 tablet (40 mg total) by mouth daily at 6 PM.  . clobetasol (TEMOVATE) 0.05 % external solution Apply 1 application topically as  needed. Rash  . lisinopril (PRINIVIL,ZESTRIL) 2.5 MG tablet Take 1 tablet (2.5 mg total) by mouth daily.  . metoprolol tartrate (LOPRESSOR) 25 MG tablet Take 1 tablet (25 mg total) by mouth 2 (two) times daily.  . montelukast  (SINGULAIR) 10 MG tablet Take 1 tablet by mouth daily. At bedtime.  . VENTOLIN HFA 108 (90 BASE) MCG/ACT inhaler Inhale 2 puffs into the lungs every 4 (four) hours as needed.     Allergies: Contrast media [iodinated diagnostic agents] and Adhesive [tape]  Social History   Social History  . Marital status: Married    Spouse name: N/A  . Number of children: N/A  . Years of education: N/A   Occupational History  . Not on file.   Social History Main Topics  . Smoking status: Former Smoker    Packs/day: 2.00    Years: 34.00    Quit date: 12/05/2004  . Smokeless tobacco: Never Used     Comment: Quit in 2007  . Alcohol use 4.8 oz/week    8 Cans of beer per week     Comment: 4-5 beers per week  . Drug use: No  . Sexual activity: Not on file   Other Topics Concern  . Not on file   Social History Narrative  . No narrative on file    Family History  Problem Relation Age of Onset  . Hypertension Mother   . CAD Father        a. father with MI at 65     Review of Systems: A 12-system review of systems was performed and was negative except as noted in the HPI.  --------------------------------------------------------------------------------------------------  Physical Exam: BP (!) 108/52 (BP Location: Left Arm, Patient Position: Sitting, Cuff Size: Normal)   Pulse 65   Ht 5\' 10"  (1.778 m)   Wt 199 lb (90.3 kg)   BMI 28.55 kg/m   General:  Overweight man, seated comfortably in the exam room. HEENT: No conjunctival pallor or scleral icterus. Moist mucous membranes.  OP clear. Neck: Supple without lymphadenopathy, thyromegaly, JVD, or HJR. Lungs: Normal work of breathing. Clear to auscultation bilaterally without wheezes or crackles. Heart: Regular rate and rhythm without murmurs, rubs, or gallops. Non-displaced PMI. Abd: Bowel sounds present. Soft, NT/ND without hepatosplenomegaly Ext: Trace pretibial edema bilaterally with compression stockings in place. Radial, PT, and  DP pulses are 2+ bilaterally. Skin: Warm and dry without rash. Median sternotomy scar well healed.  EKG:  Normal sinus rhythm without abnormalities.  Lab Results  Component Value Date   WBC 7.4 05/07/2016   HGB 10.9 (L) 05/07/2016   HCT 30.9 (L) 05/07/2016   MCV 90.4 05/07/2016   PLT 122 (L) 05/07/2016    Lab Results  Component Value Date   NA 131 (L) 05/07/2016   K 3.9 05/07/2016   CL 97 (L) 05/07/2016   CO2 26 05/07/2016   BUN 10 05/07/2016   CREATININE 0.97 05/07/2016   GLUCOSE 152 (H) 05/07/2016   ALT 23 07/02/2016    Lab Results  Component Value Date   CHOL 100 07/02/2016   HDL 28 (L) 07/02/2016   LDLCALC 52 07/02/2016   TRIG 100 07/02/2016   CHOLHDL 3.6 07/02/2016    --------------------------------------------------------------------------------------------------  ASSESSMENT AND PLAN: Coronary artery disease without angina Alan Fernandez continues to recover well from his CABG in March. His vague left upper chest discomfort does not sound cardiac in nature. I wonder if this could represent healing related to his LIMA. We will continue his  current medication regimen for secondary prevention with the exception of the escalating aspirin to 81 mg daily. We will continue with his current statin regimen. He has completed cardiac rehabilitation and should continue to exercise.  Hypertension Blood pressure is borderline low today. We will discontinue lisinopril in light of this and Alan Fernandez's persistent cough. He will continue his current dose of metoprolol.  Hyperlipidemia Alan Fernandez is tolerating atorvastatin 40 mg daily well. Lipid panel in May showed excellent LDL response.  Hyponatremia Mildly decreased sodium noted on last check just before the Alan Fernandez hospital discharge. I will repeat a basic metabolic panel today to ensure normalization.  Anemia Modest anemia also noted during the patient's hospitalization, likely from acute blood loss related to CABG. We  will recheck a CBC today to ensure that his hemoglobin has returned to normal.  Follow-up: Return to clinic in 3 months.  Nelva Bush, MD 09/21/2016 2:17 PM

## 2016-09-22 LAB — CBC WITH DIFFERENTIAL/PLATELET
BASOS: 0 %
Basophils Absolute: 0 10*3/uL (ref 0.0–0.2)
EOS (ABSOLUTE): 0.2 10*3/uL (ref 0.0–0.4)
EOS: 5 %
Hematocrit: 36.8 % — ABNORMAL LOW (ref 37.5–51.0)
Hemoglobin: 12.3 g/dL — ABNORMAL LOW (ref 13.0–17.7)
IMMATURE GRANS (ABS): 0 10*3/uL (ref 0.0–0.1)
IMMATURE GRANULOCYTES: 0 %
Lymphocytes Absolute: 1 10*3/uL (ref 0.7–3.1)
Lymphs: 27 %
MCH: 29.9 pg (ref 26.6–33.0)
MCHC: 33.4 g/dL (ref 31.5–35.7)
MCV: 90 fL (ref 79–97)
Monocytes Absolute: 0.3 10*3/uL (ref 0.1–0.9)
Monocytes: 7 %
NEUTROS ABS: 2.2 10*3/uL (ref 1.4–7.0)
NEUTROS PCT: 61 %
PLATELETS: 156 10*3/uL (ref 150–379)
RBC: 4.11 x10E6/uL — ABNORMAL LOW (ref 4.14–5.80)
RDW: 15.6 % — ABNORMAL HIGH (ref 12.3–15.4)
WBC: 3.6 10*3/uL (ref 3.4–10.8)

## 2016-09-22 LAB — BASIC METABOLIC PANEL
BUN / CREAT RATIO: 12 (ref 10–24)
BUN: 13 mg/dL (ref 8–27)
CALCIUM: 9.1 mg/dL (ref 8.6–10.2)
CHLORIDE: 104 mmol/L (ref 96–106)
CO2: 19 mmol/L — ABNORMAL LOW (ref 20–29)
CREATININE: 1.12 mg/dL (ref 0.76–1.27)
GFR calc non Af Amer: 70 mL/min/{1.73_m2} (ref >59)
GFR, EST AFRICAN AMERICAN: 81 mL/min/{1.73_m2} (ref >59)
Glucose: 135 mg/dL — ABNORMAL HIGH (ref 65–99)
Potassium: 4.1 mmol/L (ref 3.5–5.2)
Sodium: 142 mmol/L (ref 134–144)

## 2016-11-03 ENCOUNTER — Other Ambulatory Visit: Payer: Self-pay

## 2016-11-03 DIAGNOSIS — K6389 Other specified diseases of intestine: Secondary | ICD-10-CM

## 2016-11-15 NOTE — Addendum Note (Signed)
Addendum  created 11/15/16 1254 by Roderic Palau, MD   Sign clinical note

## 2016-12-02 ENCOUNTER — Ambulatory Visit
Admission: RE | Admit: 2016-12-02 | Discharge: 2016-12-02 | Disposition: A | Discharge status: Home / Self Care | Payer: 59 | Source: Ambulatory Visit | Attending: General Surgery | Admitting: General Surgery

## 2016-12-02 DIAGNOSIS — I700 Atherosclerosis of aorta: Secondary | ICD-10-CM | POA: Insufficient documentation

## 2016-12-02 DIAGNOSIS — N400 Enlarged prostate without lower urinary tract symptoms: Secondary | ICD-10-CM | POA: Diagnosis not present

## 2016-12-02 DIAGNOSIS — K639 Disease of intestine, unspecified: Secondary | ICD-10-CM | POA: Insufficient documentation

## 2016-12-02 DIAGNOSIS — K6389 Other specified diseases of intestine: Secondary | ICD-10-CM

## 2016-12-02 HISTORY — DX: Heart failure, unspecified: I50.9

## 2016-12-02 HISTORY — DX: Essential (primary) hypertension: I10

## 2016-12-02 LAB — POCT I-STAT CREATININE: CREATININE: 1.3 mg/dL — AB (ref 0.61–1.24)

## 2016-12-02 MED ORDER — IOPAMIDOL (ISOVUE-300) INJECTION 61%
100.0000 mL | Freq: Once | INTRAVENOUS | Status: AC | PRN
  Administered 2016-12-02: 100 mL via INTRAVENOUS

## 2016-12-09 ENCOUNTER — Ambulatory Visit (INDEPENDENT_AMBULATORY_CARE_PROVIDER_SITE_OTHER): Payer: 59 | Admitting: General Surgery

## 2016-12-09 ENCOUNTER — Encounter: Payer: Self-pay | Admitting: General Surgery

## 2016-12-09 ENCOUNTER — Ambulatory Visit: Payer: 59 | Admitting: General Surgery

## 2016-12-09 VITALS — BP 122/82 | HR 52 | Resp 14 | Ht 70.0 in | Wt 197.0 lb

## 2016-12-09 DIAGNOSIS — K639 Disease of intestine, unspecified: Secondary | ICD-10-CM | POA: Diagnosis not present

## 2016-12-09 DIAGNOSIS — K6389 Other specified diseases of intestine: Secondary | ICD-10-CM

## 2016-12-09 NOTE — Patient Instructions (Signed)
The patient is aware to call back for any questions or concerns.  

## 2016-12-09 NOTE — Progress Notes (Signed)
Patient ID: Alan Fernandez, male   DOB: 01/09/1956, 62 y.o.   MRN: [id number]  Chief Complaint  Patient presents with  . Follow-up    CT    HPI Alan Fernandez is a 61 y.o. male here today for his follow up ct scan done on 12/02/2016. Patient states he is doing well. He has a mesenteric mass proven benign by biopsy HPI  Past Medical History:  Diagnosis Date  . Asthma   . CAD (coronary artery disease)    a. 04/2016 NSTEMI/Cath: LM 20/90, LAD 40p, 91m, LCX 60ost, 59m, RCA 40p, 23m, RPDA 70, EF 55-65%;  b. 04/2016 s/p CABG x 2: LIMA->LAD, VG->RPL, VG->OM1->OM3.  . CHF (congestive heart failure) ([place])   . Diastolic dysfunction    a. 04/2016 Echo: EF 60-65%, grade 1 DD, mildly to mod dil RA.  Marland Kitchen Eczema   . Edema extremities   . GERD (gastroesophageal reflux disease)   . Hemorrhoid   . Hypertension   . Pneumonia Feb 2016    Past Surgical History:  Procedure Laterality Date  . COLONOSCOPY  2013  . CORONARY ARTERY BYPASS GRAFT N/A 05/05/2016   Procedure: CORONARY ARTERY BYPASS GRAFTING (CABG) x four , using left internal mammary artery and right leg greater saphenous vein harvested endoscopically;  Surgeon: Gaye Pollack, MD;  Location: [facility];  Service: Open Heart Surgery;  Laterality: N/A;  . disk in neck  2003  . [place]  . LAPAROSCOPY N/A 12/09/2014   Procedure: DIAGNOSTIC LAPAROSCOPY AND LAPAROTOMY;  Surgeon: Christene Lye, MD;  Location: [facility] ORS;  Service: General;  Laterality: N/A;  . LEFT HEART CATH AND CORONARY ANGIOGRAPHY N/A 05/04/2016   Procedure: Left Heart Cath and Coronary Angiography;  Surgeon: Nelva Bush, MD;  Location: [facility];  Service: Cardiovascular;  Laterality: N/A;  . LYMPH NODE BIOPSY  05-08-15   NON-NECROTIZING GRANULOMATOUS LYMPHADENITIS,   . TEE WITHOUT CARDIOVERSION N/A 05/05/2016   Procedure: TRANSESOPHAGEAL ECHOCARDIOGRAM (TEE);  Surgeon: Gaye Pollack, MD;  Location: [facility];  Service: Open Heart Surgery;  Laterality:  N/A;  . TONSILLECTOMY  1965    Family History  Problem Relation Age of Onset  . Hypertension Mother   . CAD Father        a. father with MI at 38     Social History Social History  Substance Use Topics  . Smoking status: Former Smoker    Packs/day: 2.00    Years: 34.00    Quit date: 12/05/2004  . Smokeless tobacco: Never Used     Comment: Quit in 2007  . Alcohol use 4.8 oz/week    8 Cans of beer per week     Comment: 4-5 beers per week    Allergies  Allergen Reactions  . Adhesive [Tape] Other (See Comments)    Pulls skin off. Paper tape is OK to use as long as it isn't too tight.    Current Outpatient Prescriptions  Medication Sig Dispense Refill  . aspirin EC 81 MG tablet Take 1 tablet (81 mg total) by mouth daily.    Marland Kitchen atorvastatin (LIPITOR) 40 MG tablet Take 1 tablet (40 mg total) by mouth daily at 6 PM. 90 tablet 2  . clobetasol (TEMOVATE) 0.05 % external solution Apply 1 application topically as needed. Rash    . metoprolol tartrate (LOPRESSOR) 25 MG tablet Take 1 tablet (25 mg total) by mouth 2 (two) times daily. 180 tablet 0  . montelukast (SINGULAIR) 10  MG tablet Take 1 tablet by mouth daily. At bedtime.    . VENTOLIN HFA 108 (90 BASE) MCG/ACT inhaler Inhale 2 puffs into the lungs every 4 (four) hours as needed.      No current facility-administered medications for this visit.     Review of Systems Review of Systems  Constitutional: Negative.   Respiratory: Negative.   Cardiovascular: Negative.     Blood pressure 122/82, pulse (!) 52, resp. rate 14, height 5\' 10"  (1.778 m), weight 197 lb (89.4 kg).  Physical Exam Physical Exam  Constitutional: He is oriented to person, place, and time. He appears well-developed and well-nourished.  HENT:  Head: Normocephalic and atraumatic.  Eyes: Conjunctivae are normal. No scleral icterus.  Neck: Neck supple.  Cardiovascular: Normal rate and regular rhythm.   Pulmonary/Chest: Effort normal and breath sounds  normal.  Abdominal: Soft. Normal appearance. There is no tenderness. Rebound: No Hepatomegaly.  Lymphadenopathy:    He has no cervical adenopathy.    He has no axillary adenopathy.       Right: No inguinal adenopathy present.       Left: No inguinal adenopathy present.  Neurological: He is alert and oriented to person, place, and time.  Skin: Skin is warm and dry. Rash: No injuinal or axillary lymph nodes.  Psychiatric: His behavior is normal.    Data Reviewed CT, pathology, and previous notes.  Assessment    Stable exam. 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 Stable on CT    Plan    Follow up with primary care, Dr Hall Busing. Recommend imaging studies (CT abdomen/pelvis, with contrast) next year.     HPI, Physical Exam, Assessment and Plan have been scribed under the direction and in the presence of Mckinley Jewel, MD Karie Fetch, RN I have completed the exam and reviewed the above documentation for accuracy and completeness.  I agree with the above.  Haematologist has been used and any errors in dictation or transcription are unintentional.  Yussef Jorge G. Jamal Collin, M.D., F.A.C.S.   Junie Panning G 12/13/2016, 5:15 PM

## 2016-12-16 ENCOUNTER — Ambulatory Visit: Payer: 59 | Admitting: General Surgery

## 2016-12-27 NOTE — Progress Notes (Signed)
Follow-up Outpatient Visit Date: 12/28/2016  Primary Care Provider: Albina Billet, MD [address]   [place] [address]  Chief Complaint: Follow-up coronary artery disease  HPI:  Alan Fernandez is a 61 y.o. year-old male with history of coronary artery disease status post 4 vessel CABG (04/2016), HFpEF, asthma, and GERD, who presents for follow-up of coronary artery disease. I last saw him in July, at which time he was recovering well from his NSTEMI and subsequent CABG. Lisinopril was discontinued due to borderline low blood pressure and cough.  Today, Alan Fernandez reports that he has been doing well.  He notices occasional fleeting chest wall pains.  He has not had any chest pain with activity.  He also denies shortness of breath, palpitations, lightheadedness, edema, orthopnea, and PND.  He continues to exercise regularly without any difficulty.  He notes that his weight loss has plateaued since the summer.  --------------------------------------------------------------------------------------------------  Cardiovascular History & Procedures: Cardiovascular Problems:  Coronary artery disease status post CABG  Heart failure with preserved ejection fraction  Risk Factors:  Known coronary artery disease, male gender, age greater than 76  Cath/PCI:  LHC (05/04/16): 20% proximal and 90% distal LMCA stenoses. If use 40% proximal and focal 50% mid LAD stenoses. 60% ostial LCx and 70% mid LCx lesions. Moderate-caliber ramus without significant disease. RCA with sequential 40% proximal and 30% mid vessel stenoses. RPDA with 70% mid vessel lesion. LVEF 55-65%. LVEDP 12 mmHg.  CV Surgery:  CABG (05/05/16, Dr. Cyndia Bent): LIMA to LAD, sequential SVG to OM1 and OM3, and SVG to RPL.  EP Procedures and Devices:  None  Non-Invasive Evaluation(s):  Carotid artery Dopplers (05/04/16): 1-39% bilateral internal carotid artery stenoses. Antegrade vertebral artery flow.  TTE (05/04/16):  Normal LV size with mild LVH. LVEF 60-65%. Grade 1 diastolic dysfunction. Normal RV size and function. Mild to moderate right atrial enlargement. No significant valvular abnormalities.  Recent CV Pertinent Labs: Lab Results  Component Value Date   CHOL 100 07/02/2016   HDL 28 (L) 07/02/2016   LDLCALC 52 07/02/2016   TRIG 100 07/02/2016   CHOLHDL 3.6 07/02/2016   INR 1.37 05/05/2016   K 4.1 09/21/2016   MG 2.3 05/06/2016   BUN 13 09/21/2016   CREATININE 1.30 (H) 12/02/2016    Past medical and surgical history were reviewed and updated in EPIC.  Current Meds  Medication Sig  . aspirin EC 81 MG tablet Take 1 tablet (81 mg total) by mouth daily.  Marland Kitchen atorvastatin (LIPITOR) 40 MG tablet Take 1 tablet (40 mg total) by mouth daily at 6 PM.  . clobetasol (TEMOVATE) 0.05 % external solution Apply 1 application topically as needed. Rash  . metoprolol tartrate (LOPRESSOR) 25 MG tablet Take 1 tablet (25 mg total) by mouth 2 (two) times daily.  . montelukast (SINGULAIR) 10 MG tablet Take 1 tablet by mouth daily. At bedtime.  . VENTOLIN HFA 108 (90 BASE) MCG/ACT inhaler Inhale 2 puffs into the lungs every 4 (four) hours as needed.     Allergies: Adhesive [tape]  Social History   Socioeconomic History  . Marital status: Married    Spouse name: Not on file  . Number of children: Not on file  . Years of education: Not on file  . Highest education level: Not on file  Social Needs  . Financial resource strain: Not on file  . Food insecurity - worry: Not on file  . Food insecurity - inability: Not on file  .  Transportation needs - medical: Not on file  . Transportation needs - non-medical: Not on file  Occupational History  . Not on file  Tobacco Use  . Smoking status: Former Smoker    Packs/day: 2.00    Years: 34.00    Pack years: 68.00    Last attempt to quit: 12/05/2004    Years since quitting: 12.0  . Smokeless tobacco: Never Used  . Tobacco comment: Quit in 2007  Substance and  Sexual Activity  . Alcohol use: Yes    Alcohol/week: 4.8 oz    Types: 8 Cans of beer per week    Comment: 4-5 beers per week  . Drug use: No  . Sexual activity: Not on file  Other Topics Concern  . Not on file  Social History Narrative  . Not on file    Family History  Problem Relation Age of Onset  . Hypertension Mother   . CAD Father        a. father with MI at 39     Review of Systems: A 12-system review of systems was performed and was negative except as noted in the HPI.  --------------------------------------------------------------------------------------------------  Physical Exam: BP 128/60 (BP Location: Left Arm, Patient Position: Sitting, Cuff Size: Normal)   Pulse 61   Ht 5\' 10"  (1.778 m)   Wt 90.7 kg (200 lb)   BMI 28.70 kg/m   General: Well-developed, well-nourished man, seated comfortably in the exam room. HEENT: No conjunctival pallor or scleral icterus. Moist mucous membranes.  OP clear. Neck: Supple without lymphadenopathy, thyromegaly, JVD, or HJR. No carotid bruit. Lungs: Normal work of breathing. Clear to auscultation bilaterally without wheezes or crackles. Heart: Regular rate and rhythm without murmurs, rubs, or gallops. Non-displaced PMI. Abd: Bowel sounds present. Soft, NT/ND without hepatosplenomegaly Ext: No lower extremity edema. Radial, PT, and DP pulses are 2+ bilaterally. Skin: Warm and dry without rash.  EKG: Normal sinus rhythm without abnormalities.  Lab Results  Component Value Date   WBC 3.6 09/21/2016   HGB 12.3 (L) 09/21/2016   HCT 36.8 (L) 09/21/2016   MCV 90 09/21/2016   PLT 156 09/21/2016    Lab Results  Component Value Date   NA 142 09/21/2016   K 4.1 09/21/2016   CL 104 09/21/2016   CO2 19 (L) 09/21/2016   BUN 13 09/21/2016   CREATININE 1.30 (H) 12/02/2016   GLUCOSE 135 (H) 09/21/2016   ALT 23 07/02/2016    Lab Results  Component Value Date   CHOL 100 07/02/2016   HDL 28 (L) 07/02/2016   LDLCALC 52  07/02/2016   TRIG 100 07/02/2016   CHOLHDL 3.6 07/02/2016    --------------------------------------------------------------------------------------------------  ASSESSMENT AND PLAN: Coronary artery disease without angina Alan Fernandez has recovered well from his CABG in March.  His occasional chest wall pains are not consistent with angina.  His EKG today is normal.  We will continue his current medications for secondary prevention (aspirin, atorvastatin, and metoprolol).  Hypertension Blood pressure is well controlled today.  We will continue his current dose of metoprolol tartrate 25 mg twice daily.  Hyperlipidemia Most recent LDL in May was at goal (52).  We will continue with atorvastatin 40 mg daily and plan to repeat a lipid panel and CMP shortly before Alan Fernandez returns to see me in 6 months.  Follow-up: Return to clinic in 6 months.  Nelva Bush, MD 12/28/2016 1:35 PM

## 2016-12-28 ENCOUNTER — Ambulatory Visit (INDEPENDENT_AMBULATORY_CARE_PROVIDER_SITE_OTHER): Payer: 59 | Admitting: Internal Medicine

## 2016-12-28 ENCOUNTER — Encounter: Payer: Self-pay | Admitting: Internal Medicine

## 2016-12-28 VITALS — BP 128/60 | HR 61 | Ht 70.0 in | Wt 200.0 lb

## 2016-12-28 DIAGNOSIS — I2510 Atherosclerotic heart disease of native coronary artery without angina pectoris: Secondary | ICD-10-CM

## 2016-12-28 DIAGNOSIS — E785 Hyperlipidemia, unspecified: Secondary | ICD-10-CM

## 2016-12-28 DIAGNOSIS — I10 Essential (primary) hypertension: Secondary | ICD-10-CM

## 2016-12-28 NOTE — Patient Instructions (Signed)
Medication Instructions:  Your physician recommends that you continue on your current medications as directed. Please refer to the Current Medication list given to you today.   Labwork: Your physician recommends that you return for lab work in: [place]. (CBC, CMP, LIPID). - You will need to be fasting. DO NOT EAT OR DRINK after midnight the morning of lab work. - Please go to the [facility]. You will check in at the front desk to the right as you walk into the atrium. Valet Parking is offered if needed.   Testing/Procedures: NONE  Follow-Up: Your physician wants you to follow-up in: 6 MONTHS WITH DR END AFTER HAVING LAB WORK. You will receive a reminder letter in the mail two months in advance. If you don't receive a letter, please call our office to schedule the follow-up appointment.  If you need a refill on your cardiac medications before your next appointment, please call your pharmacy.

## 2016-12-29 ENCOUNTER — Encounter: Payer: Self-pay | Admitting: Internal Medicine

## 2017-06-27 ENCOUNTER — Other Ambulatory Visit
Admission: RE | Admit: 2017-06-27 | Discharge: 2017-06-27 | Disposition: A | Discharge status: Home / Self Care | Payer: 59 | Source: Ambulatory Visit | Attending: Internal Medicine | Admitting: Internal Medicine

## 2017-06-27 DIAGNOSIS — I2510 Atherosclerotic heart disease of native coronary artery without angina pectoris: Secondary | ICD-10-CM | POA: Insufficient documentation

## 2017-06-27 DIAGNOSIS — I10 Essential (primary) hypertension: Secondary | ICD-10-CM | POA: Diagnosis present

## 2017-06-27 DIAGNOSIS — E785 Hyperlipidemia, unspecified: Secondary | ICD-10-CM | POA: Diagnosis present

## 2017-06-27 LAB — CBC WITH DIFFERENTIAL/PLATELET
BASOS ABS: 0 10*3/uL (ref 0–0.1)
Basophils Relative: 1 %
EOS ABS: 0.3 10*3/uL (ref 0–0.7)
Eosinophils Relative: 7 %
HCT: 39.1 % — ABNORMAL LOW (ref 40.0–52.0)
Hemoglobin: 13.6 g/dL (ref 13.0–18.0)
LYMPHS ABS: 1.2 10*3/uL (ref 1.0–3.6)
LYMPHS PCT: 29 %
MCH: 32.6 pg (ref 26.0–34.0)
MCHC: 34.7 g/dL (ref 32.0–36.0)
MCV: 93.8 fL (ref 80.0–100.0)
MONO ABS: 0.5 10*3/uL (ref 0.2–1.0)
Monocytes Relative: 13 %
Neutro Abs: 2.1 10*3/uL (ref 1.4–6.5)
Neutrophils Relative %: 52 %
Platelets: 163 10*3/uL (ref 150–440)
RBC: 4.17 MIL/uL — AB (ref 4.40–5.90)
RDW: 14.9 % — AB (ref 11.5–14.5)
WBC: 4.1 10*3/uL (ref 3.8–10.6)

## 2017-06-27 LAB — LIPID PANEL
CHOLESTEROL: 99 mg/dL (ref 0–200)
HDL: 37 mg/dL — ABNORMAL LOW (ref >40)
LDL Cholesterol: 28 mg/dL (ref 0–99)
TRIGLYCERIDES: 169 mg/dL — AB (ref <150)
Total CHOL/HDL Ratio: 2.7 RATIO
VLDL: 34 mg/dL (ref 0–40)

## 2017-06-27 LAB — COMPREHENSIVE METABOLIC PANEL
ALBUMIN: 3.9 g/dL (ref 3.5–5.0)
ALK PHOS: 67 U/L (ref 38–126)
ALT: 21 U/L (ref 17–63)
AST: 24 U/L (ref 15–41)
Anion gap: 6 (ref 5–15)
BILIRUBIN TOTAL: 1.3 mg/dL — AB (ref 0.3–1.2)
BUN: 19 mg/dL (ref 6–20)
CO2: 26 mmol/L (ref 22–32)
CREATININE: 1.09 mg/dL (ref 0.61–1.24)
Calcium: 9 mg/dL (ref 8.9–10.3)
Chloride: 106 mmol/L (ref 101–111)
GFR calc Af Amer: >60 mL/min (ref >60)
GLUCOSE: 101 mg/dL — AB (ref 65–99)
Potassium: 3.9 mmol/L (ref 3.5–5.1)
Sodium: 138 mmol/L (ref 135–145)
TOTAL PROTEIN: 6.3 g/dL — AB (ref 6.5–8.1)

## 2017-06-29 ENCOUNTER — Encounter: Payer: Self-pay | Admitting: Internal Medicine

## 2017-06-29 ENCOUNTER — Ambulatory Visit: Payer: 59 | Admitting: Internal Medicine

## 2017-06-29 VITALS — BP 126/60 | HR 54 | Ht 70.0 in | Wt 201.0 lb

## 2017-06-29 DIAGNOSIS — E785 Hyperlipidemia, unspecified: Secondary | ICD-10-CM | POA: Diagnosis not present

## 2017-06-29 DIAGNOSIS — I2510 Atherosclerotic heart disease of native coronary artery without angina pectoris: Secondary | ICD-10-CM

## 2017-06-29 DIAGNOSIS — R001 Bradycardia, unspecified: Secondary | ICD-10-CM | POA: Insufficient documentation

## 2017-06-29 DIAGNOSIS — I10 Essential (primary) hypertension: Secondary | ICD-10-CM | POA: Diagnosis not present

## 2017-06-29 MED ORDER — NITROGLYCERIN 0.4 MG SL SUBL: 0.4000 mg | SUBLINGUAL_TABLET | SUBLINGUAL | 3 refills | Status: AC | PRN

## 2017-06-29 NOTE — Progress Notes (Signed)
Follow-up Outpatient Visit Date: 06/29/2017  Primary Care Provider: Albina Billet, MD [address]   [place] [address]  Chief Complaint: Follow-up coronary artery disease  HPI:  Alan Fernandez is a 62 y.o. year-old male with history of coronary artery disease status post CABG (04/2016), heart failure with preserved ejection fraction, asthma, and GERD, who presents for follow-up of coronary artery disease.  I last saw Alan Fernandez in November, at which time he was doing well.  He reported occasional fleeting chest wall pain but no angina.  Today, Alan Fernandez reports that he has been feeling very well.  He denies chest pain, shortness of breath, palpitations, and lightheadedness.  He notes occasional swelling in his legs, which has been chronic for him, though this is typically well controlled when he wears compression stockings.  He has not had any orthopnea or PND.  Alan Fernandez is exercising regularly without difficulty.  He is tolerating his medications well, though he notes easy bruising.  --------------------------------------------------------------------------------------------------  Cardiovascular History & Procedures: Cardiovascular Problems:  Coronary artery disease status post CABG  Heart failure with preserved ejection fraction  Risk Factors:  Known coronary artery disease, male gender, age greater than 81  Cath/PCI:  LHC (05/04/16): 20% proximal and 90% distal LMCA stenoses. If use 40% proximal and focal 50% mid LAD stenoses. 60% ostial LCx and 70% mid LCx lesions. Moderate-caliber ramus without significant disease. RCA with sequential 40% proximal and 30% mid vessel stenoses. RPDA with 70% mid vessel lesion. LVEF 55-65%. LVEDP 12 mmHg.  CV Surgery:  CABG (05/05/16, Dr.Bartle): LIMA to LAD, sequential SVG to OM1 and OM3, andSVG to RPL.  EP Procedures and Devices:  None  Non-Invasive Evaluation(s):  Carotid artery Dopplers (05/04/16): 1-39% bilateral internal  carotid artery stenoses. Antegrade vertebral artery flow.  TTE (05/04/16): Normal LV size with mild LVH. LVEF 60-65%. Grade 1 diastolic dysfunction. Normal RV size and function. Mild to moderate right atrial enlargement. No significant valvular abnormalities.   Recent CV Pertinent Labs: Lab Results  Component Value Date   CHOL 99 06/27/2017   HDL 37 (L) 06/27/2017   LDLCALC 28 06/27/2017   TRIG 169 (H) 06/27/2017   CHOLHDL 2.7 06/27/2017   INR 1.37 05/05/2016   K 3.9 06/27/2017   MG 2.3 05/06/2016   BUN 19 06/27/2017   BUN 13 09/21/2016   CREATININE 1.09 06/27/2017    Past medical and surgical history were reviewed and updated in EPIC.  Current Meds  Medication Sig  . aspirin EC 81 MG tablet Take 1 tablet (81 mg total) by mouth daily.  Marland Kitchen atorvastatin (LIPITOR) 40 MG tablet Take 1 tablet (40 mg total) by mouth daily at 6 PM.  . clobetasol (TEMOVATE) 0.05 % external solution Apply 1 application topically as needed. Rash  . metoprolol tartrate (LOPRESSOR) 25 MG tablet Take 1 tablet (25 mg total) by mouth 2 (two) times daily.  . montelukast (SINGULAIR) 10 MG tablet Take 1 tablet by mouth daily. At bedtime.  . VENTOLIN HFA 108 (90 BASE) MCG/ACT inhaler Inhale 2 puffs into the lungs every 4 (four) hours as needed.     Allergies: Adhesive [tape]  Social History   Tobacco Use  . Smoking status: Former Smoker    Packs/day: 2.00    Years: 34.00    Pack years: 68.00    Last attempt to quit: 12/05/2004    Years since quitting: 12.5  . Smokeless tobacco: Never Used  . Tobacco comment: Quit in 2007  Substance Use Topics  . Alcohol use: Yes    Alcohol/week: 4.8 oz    Types: 8 Cans of beer per week    Comment: 4-5 beers per week  . Drug use: No    Family History  Problem Relation Age of Onset  . Hypertension Mother   . CAD Father        a. father with MI at 18     Review of Systems: A 12-system review of systems was performed and was negative except as noted in the  HPI.  --------------------------------------------------------------------------------------------------  Physical Exam: BP 126/60 (BP Location: Left Arm, Patient Position: Sitting, Cuff Size: Normal)   Pulse (!) 54   Ht 5\' 10"  (1.778 m)   Wt 201 lb (91.2 kg)   BMI 28.84 kg/m   General: NAD. HEENT: No conjunctival pallor or scleral icterus. Moist mucous membranes.  OP clear. Neck: Supple without lymphadenopathy, thyromegaly, JVD, or HJR. Lungs: Normal work of breathing. Clear to auscultation bilaterally without wheezes or crackles. Heart: Bradycardic but regular without murmurs, rubs, or gallops.  Nondisplaced PMI. Abd: Bowel sounds present. Soft, NT/ND without hepatosplenomegaly Ext: No lower extremity edema. Radial, PT, and DP pulses are 2+ bilaterally. Skin: Warm and dry.  Bilateral hand/forearm ecchymoses noted.  EKG: Sinus bradycardia (heart rate 54 bpm).  Otherwise, no significant abnormalities.  Lab Results  Component Value Date   WBC 4.1 06/27/2017   HGB 13.6 06/27/2017   HCT 39.1 (L) 06/27/2017   MCV 93.8 06/27/2017   PLT 163 06/27/2017    Lab Results  Component Value Date   NA 138 06/27/2017   K 3.9 06/27/2017   CL 106 06/27/2017   CO2 26 06/27/2017   BUN 19 06/27/2017   CREATININE 1.09 06/27/2017   GLUCOSE 101 (H) 06/27/2017   ALT 21 06/27/2017    Lab Results  Component Value Date   CHOL 99 06/27/2017   HDL 37 (L) 06/27/2017   LDLCALC 28 06/27/2017   TRIG 169 (H) 06/27/2017   CHOLHDL 2.7 06/27/2017    --------------------------------------------------------------------------------------------------  ASSESSMENT AND PLAN: Coronary artery disease without angina Alan Fernandez continues to do very well following his CABG in the setting of unstable angina last March.  We will continue his current medications for secondary prevention.  I encouraged him to remain active and to let us know if any new symptoms develop.  I have provided him with a prescription  for sublingual nitroglycerin to be used as needed should he develop chest pain.  Sinus bradycardia No symptoms related to this.  We will continue current dose of metoprolol tartrate 25 mg twice daily unless Alan Fernandez becomes symptomatic.  Hypertension Blood pressure well controlled today.  No medication changes.  Hyperlipidemia LDL very well controlled on most recent lipid panel earlier this week.  We will continue current dose of atorvastatin.  I encouraged continued exercise to help with slightly low HDL.  Follow-up: Return to clinic in 1 year.  Nelva Bush, MD 06/29/2017 11:29 AM

## 2017-06-29 NOTE — Patient Instructions (Addendum)
Medication Instructions:  Your physician recommends that you continue on your current medications as directed. Please refer to the Current Medication list given to you today.  We have sent in a prescription for Nitroglycerin to take AS NEEDED for chest pain. Take Nitroglycerin 0.4 mg (1 tablet) dissolved under the tongue every 5 minutes for MAXIMUM of 3 doses.   If a single episode of chest pain is not relieved by one tablet, the patient will try another within 5 minutes for maximum of 3 doses; and if this doesn't relieve the pain, the patient is instructed to call 911 for transportation to an emergency department.    Labwork: NONE  Testing/Procedures: NONE  Follow-Up: Your physician wants you to follow-up in: 12 MONTHS WITH DR END. You will receive a reminder letter in the mail two months in advance. If you don't receive a letter, please call our office to schedule the follow-up appointment.  If you need a refill on your cardiac medications before your next appointment, please call your pharmacy.   Nitroglycerin sublingual tablets What is this medicine? NITROGLYCERIN (nye troe GLI ser in) is a type of vasodilator. It relaxes blood vessels, increasing the blood and oxygen supply to your heart. This medicine is used to relieve chest pain caused by angina. It is also used to prevent chest pain before activities like climbing stairs, going outdoors in cold weather, or sexual activity. This medicine may be used for other purposes; ask your health care provider or pharmacist if you have questions. COMMON BRAND NAME(S): Nitroquick, Nitrostat, Nitrotab What should I tell my health care provider before I take this medicine? They need to know if you have any of these conditions: -anemia -head injury, recent stroke, or bleeding in the brain -liver disease -previous heart attack -an unusual or allergic reaction to nitroglycerin, other medicines, foods, dyes, or preservatives -pregnant or  trying to get pregnant -breast-feeding How should I use this medicine? Take this medicine by mouth as needed. At the first sign of an angina attack (chest pain or tightness) place one tablet under your tongue. You can also take this medicine 5 to 10 minutes before an event likely to produce chest pain. Follow the directions on the prescription label. Let the tablet dissolve under the tongue. Do not swallow whole. Replace the dose if you accidentally swallow it. It will help if your mouth is not dry. Saliva around the tablet will help it to dissolve more quickly. Do not eat or drink, smoke or chew tobacco while a tablet is dissolving. If you are not better within 5 minutes after taking ONE dose of nitroglycerin, call 9-1-1 immediately to seek emergency medical care. Do not take more than 3 nitroglycerin tablets over 15 minutes. If you take this medicine often to relieve symptoms of angina, your doctor or health care professional may provide you with different instructions to manage your symptoms. If symptoms do not go away after following these instructions, it is important to call 9-1-1 immediately. Do not take more than 3 nitroglycerin tablets over 15 minutes. Talk to your pediatrician regarding the use of this medicine in children. Special care may be needed. Overdosage: If you think you have taken too much of this medicine contact a poison control center or emergency room at once. NOTE: This medicine is only for you. Do not share this medicine with others. What if I miss a dose? This does not apply. This medicine is only used as needed. What may interact with this medicine? Do  not take this medicine with any of the following medications: -certain migraine medicines like ergotamine and dihydroergotamine (DHE) -medicines used to treat erectile dysfunction like sildenafil, tadalafil, and vardenafil -riociguat This medicine may also interact with the following  medications: -alteplase -aspirin -heparin -medicines for high blood pressure -medicines for mental depression -other medicines used to treat angina -phenothiazines like chlorpromazine, mesoridazine, prochlorperazine, thioridazine This list may not describe all possible interactions. Give your health care provider a list of all the medicines, herbs, non-prescription drugs, or dietary supplements you use. Also tell them if you smoke, drink alcohol, or use illegal drugs. Some items may interact with your medicine. What should I watch for while using this medicine? Tell your doctor or health care professional if you feel your medicine is no longer working. Keep this medicine with you at all times. Sit or lie down when you take your medicine to prevent falling if you feel dizzy or faint after using it. Try to remain calm. This will help you to feel better faster. If you feel dizzy, take several deep breaths and lie down with your feet propped up, or bend forward with your head resting between your knees. You may get drowsy or dizzy. Do not drive, use machinery, or do anything that needs mental alertness until you know how this drug affects you. Do not stand or sit up quickly, especially if you are an older patient. This reduces the risk of dizzy or fainting spells. Alcohol can make you more drowsy and dizzy. Avoid alcoholic drinks. Do not treat yourself for coughs, colds, or pain while you are taking this medicine without asking your doctor or health care professional for advice. Some ingredients may increase your blood pressure. What side effects may I notice from receiving this medicine? Side effects that you should report to your doctor or health care professional as soon as possible: -blurred vision -dry mouth -skin rash -sweating -the feeling of extreme pressure in the head -unusually weak or tired Side effects that usually do not require medical attention (report to your doctor or health care  professional if they continue or are bothersome): -flushing of the face or neck -headache -irregular heartbeat, palpitations -nausea, vomiting This list may not describe all possible side effects. Call your doctor for medical advice about side effects. You may report side effects to FDA at 1-800-FDA-1088. Where should I keep my medicine? Keep out of the reach of children. Store at room temperature between 20 and 25 degrees C (68 and 77 degrees F). Store in [employer]. Protect from light and moisture. Keep tightly closed. Throw away any unused medicine after the expiration date. NOTE: This sheet is a summary. It may not cover all possible information. If you have questions about this medicine, talk to your doctor, pharmacist, or health care provider.  2018 Elsevier/Gold Standard (2012-12-07 17:57:36)

## 2017-08-23 ENCOUNTER — Other Ambulatory Visit: Payer: Self-pay | Admitting: *Deleted

## 2017-08-23 DIAGNOSIS — I2510 Atherosclerotic heart disease of native coronary artery without angina pectoris: Secondary | ICD-10-CM

## 2017-08-23 MED ORDER — ATORVASTATIN CALCIUM 40 MG PO TABS: 40.0000 mg | ORAL_TABLET | Freq: Every day | ORAL | 1 refills | Status: DC

## 2017-09-07 ENCOUNTER — Other Ambulatory Visit: Payer: Self-pay

## 2017-09-07 ENCOUNTER — Telehealth: Payer: Self-pay | Admitting: Internal Medicine

## 2017-09-07 DIAGNOSIS — I10 Essential (primary) hypertension: Secondary | ICD-10-CM

## 2017-09-07 MED ORDER — METOPROLOL TARTRATE 25 MG PO TABS: 25.0000 mg | ORAL_TABLET | Freq: Two times a day (BID) | ORAL | 0 refills | Status: DC

## 2017-09-07 NOTE — Telephone Encounter (Signed)
Refill sent   metoprolol tartrate (LOPRESSOR) 25 MG tablet 180 tablet 0 09/07/2017    Sig - Route: Take 1 tablet (25 mg total) by mouth 2 (two) times daily. - Oral   Sent to pharmacy as: metoprolol tartrate (LOPRESSOR) 25 MG tablet   E-Prescribing Status: Receipt confirmed by pharmacy (09/07/2017 12:13 PM EDT)   Associated Diagnoses   Essential hypertension     Pharmacy   [id number] - [place], [place] - [address]

## 2017-09-07 NOTE — Telephone Encounter (Signed)
°*  STAT* If patient is at the pharmacy, call can be transferred to refill team.   1. Which medications need to be refilled? (please list name of each medication and dose if known) Metoprolol   2. Which pharmacy/location (including street and city if local pharmacy) is medication to be sent to? [facility] [place]  3. Do they need a 30 day or 90 day supply? 90 day

## 2017-11-27 ENCOUNTER — Other Ambulatory Visit: Payer: Self-pay | Admitting: Internal Medicine

## 2017-11-27 DIAGNOSIS — I10 Essential (primary) hypertension: Secondary | ICD-10-CM

## 2018-02-24 ENCOUNTER — Other Ambulatory Visit: Payer: Self-pay | Admitting: Internal Medicine

## 2018-02-24 DIAGNOSIS — I2510 Atherosclerotic heart disease of native coronary artery without angina pectoris: Secondary | ICD-10-CM

## 2018-02-24 MED ORDER — ATORVASTATIN CALCIUM 40 MG PO TABS: 40.0000 mg | ORAL_TABLET | Freq: Every day | ORAL | 1 refills | Status: DC

## 2018-02-24 NOTE — Telephone Encounter (Signed)
°*  STAT* If patient is at the pharmacy, call can be transferred to refill team.   1. Which medications need to be refilled? (please list name of each medication and dose if known) atorvastatin 40 MG - 1 tablet daily at 6 PM  2. Which pharmacy/location (including street and city if local pharmacy) is medication to be sent to? [facility] [address]  3. Do they need a 30 day or 90 day supply? 90 day

## 2018-09-01 ENCOUNTER — Telehealth: Payer: Self-pay | Admitting: Internal Medicine

## 2018-09-01 ENCOUNTER — Other Ambulatory Visit: Payer: Self-pay

## 2018-09-01 DIAGNOSIS — I2510 Atherosclerotic heart disease of native coronary artery without angina pectoris: Secondary | ICD-10-CM

## 2018-09-01 DIAGNOSIS — I10 Essential (primary) hypertension: Secondary | ICD-10-CM

## 2018-09-01 MED ORDER — ATORVASTATIN CALCIUM 40 MG PO TABS: 40.0000 mg | ORAL_TABLET | Freq: Every day | ORAL | 0 refills | Status: AC

## 2018-09-01 MED ORDER — METOPROLOL TARTRATE 25 MG PO TABS: 25.0000 mg | ORAL_TABLET | Freq: Two times a day (BID) | ORAL | 0 refills | Status: AC

## 2018-09-01 NOTE — Telephone Encounter (Signed)
Correction:  Fwd to end as fyi

## 2018-09-01 NOTE — Telephone Encounter (Signed)
Patient declined to schedule.  Needed rx to give time for new cards in [place]    Deleting recall.  Closing encounter.  Fwd to [place] as fyi

## 2018-09-01 NOTE — Telephone Encounter (Signed)
Medication refilled.  Patient is past due for an appointment can you try to schedule him.  Can be in office or virtual.

## 2018-09-01 NOTE — Telephone Encounter (Signed)
°*  STAT* If patient is at the pharmacy, call can be transferred to refill team.   1. Which medications need to be refilled? (please list name of each medication and dose if known) Lopressor 25 mg bid and Lipitor 40 mg daily  2. Which pharmacy/location (including street and city if local pharmacy) is medication to be sent to? [facility] [place] [facility]ph: [phone][facility] (store# [phone]) [place]  3. Do they need a 30 day or 90 day supply? 90  Patient is running out today

## 2019-01-28 IMAGING — CR DG CHEST 1V PORT
1 series · 1 of 1 positions shown · non-contrast
Comparison: Portable chest x-ray 05 May, 2016

CLINICAL DATA: Status post CABG on 05 May, 2016

EXAM:
PORTABLE CHEST 1 VIEW

[AP]
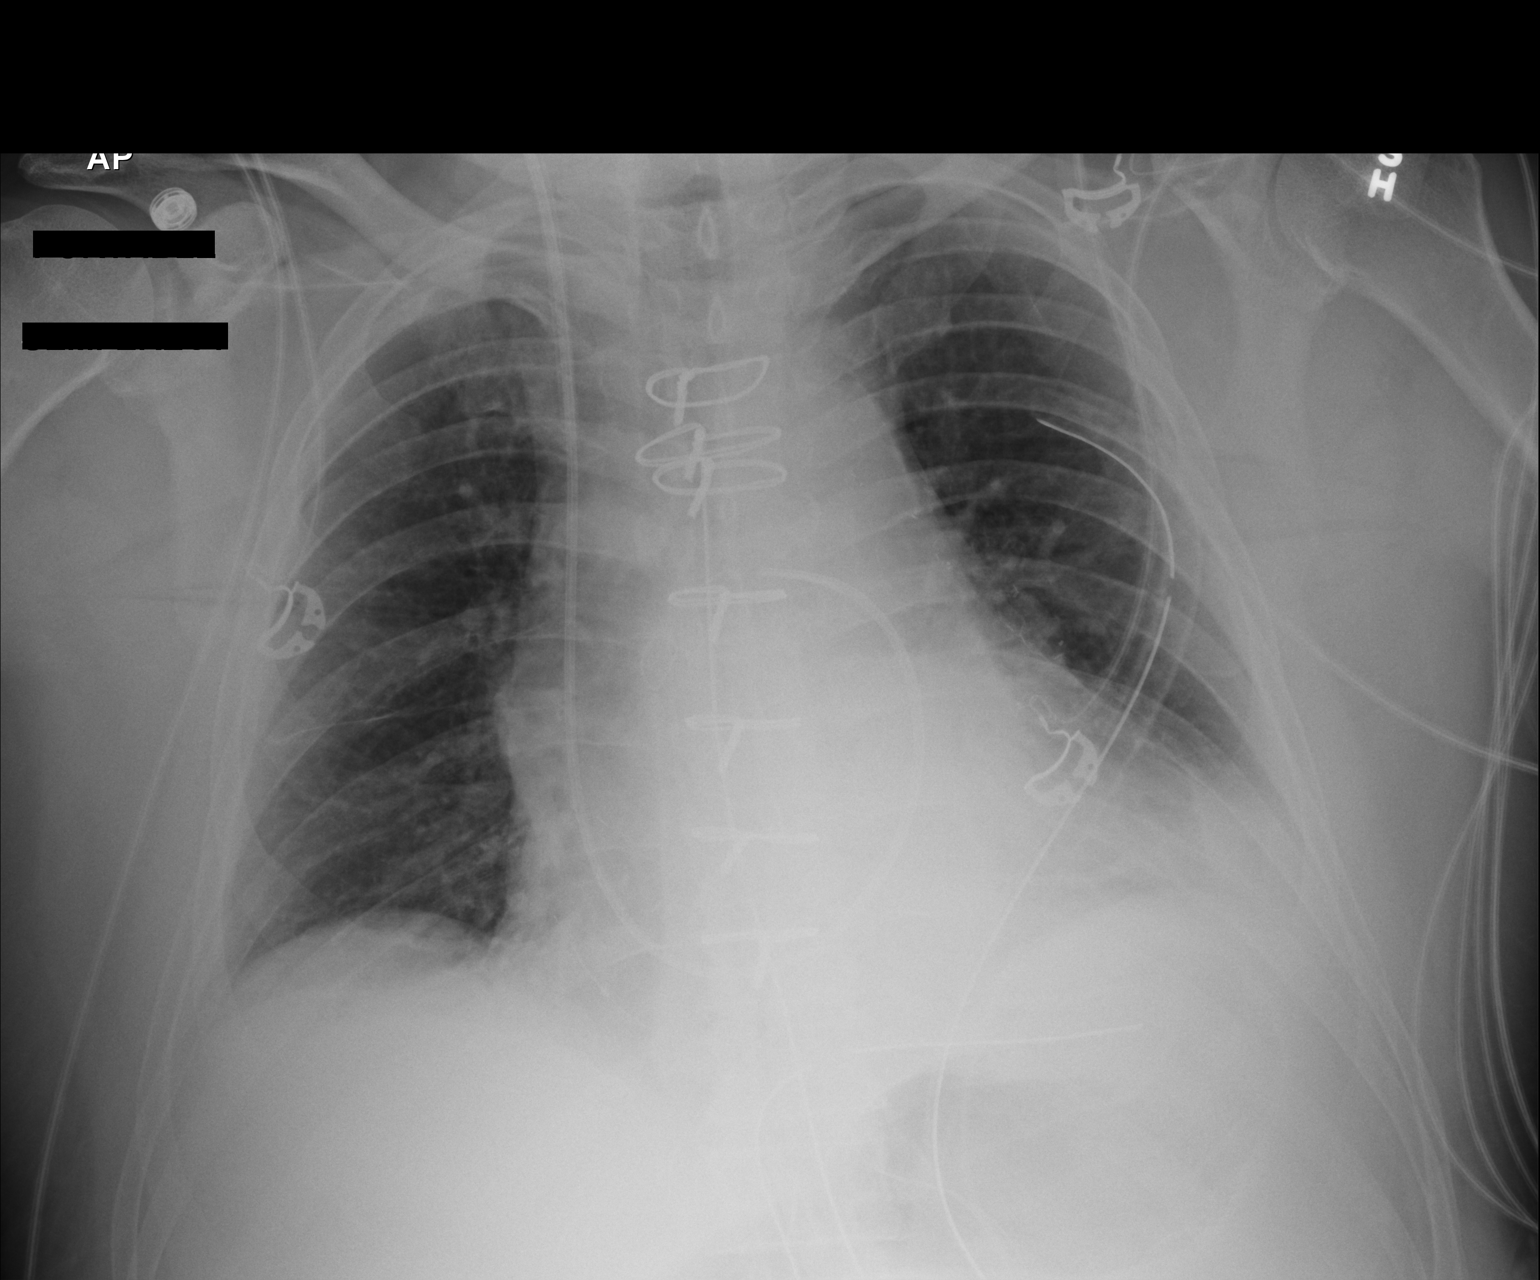

[1 of 1 positions shown; findings below may reference images not displayed]

FINDINGS: The lungs are reasonably well inflated since extubation. There is no
pneumothorax. There is basilar density laterally on the left small
left pleural effusion. The left upper chest tube tip projects over
the posterior aspect of the fifth rib. The lower chest tube projects
at the level of the posterior costophrenic angle The cardiac
silhouette is enlarged. The pulmonary vascularity is not clearly
engorged. The Swan-Ganz catheter tip projects in the proximal right
main pulmonary artery. The mediastinal drain projects at
approximately the T5 level.
IMPRESSION: Status post median sternotomy presumably for CABG. Interval
extubation of the trachea and esophagus. No pulmonary edema. Mild
left lower lobe atelectasis and probable small left pleural
effusion. No pneumothorax. The support tubes and lines in reasonable
position.

## 2019-02-05 IMAGING — CR DG CHEST 2V
2 series · 2 of 2 positions shown · non-contrast
Comparison: 05/07/2016 .  05/06/2016.  05/05/2016.  05/04/2016 .

CLINICAL DATA: CABG.  Cough.

EXAM:
CHEST  2 VIEW

[w chest pa]
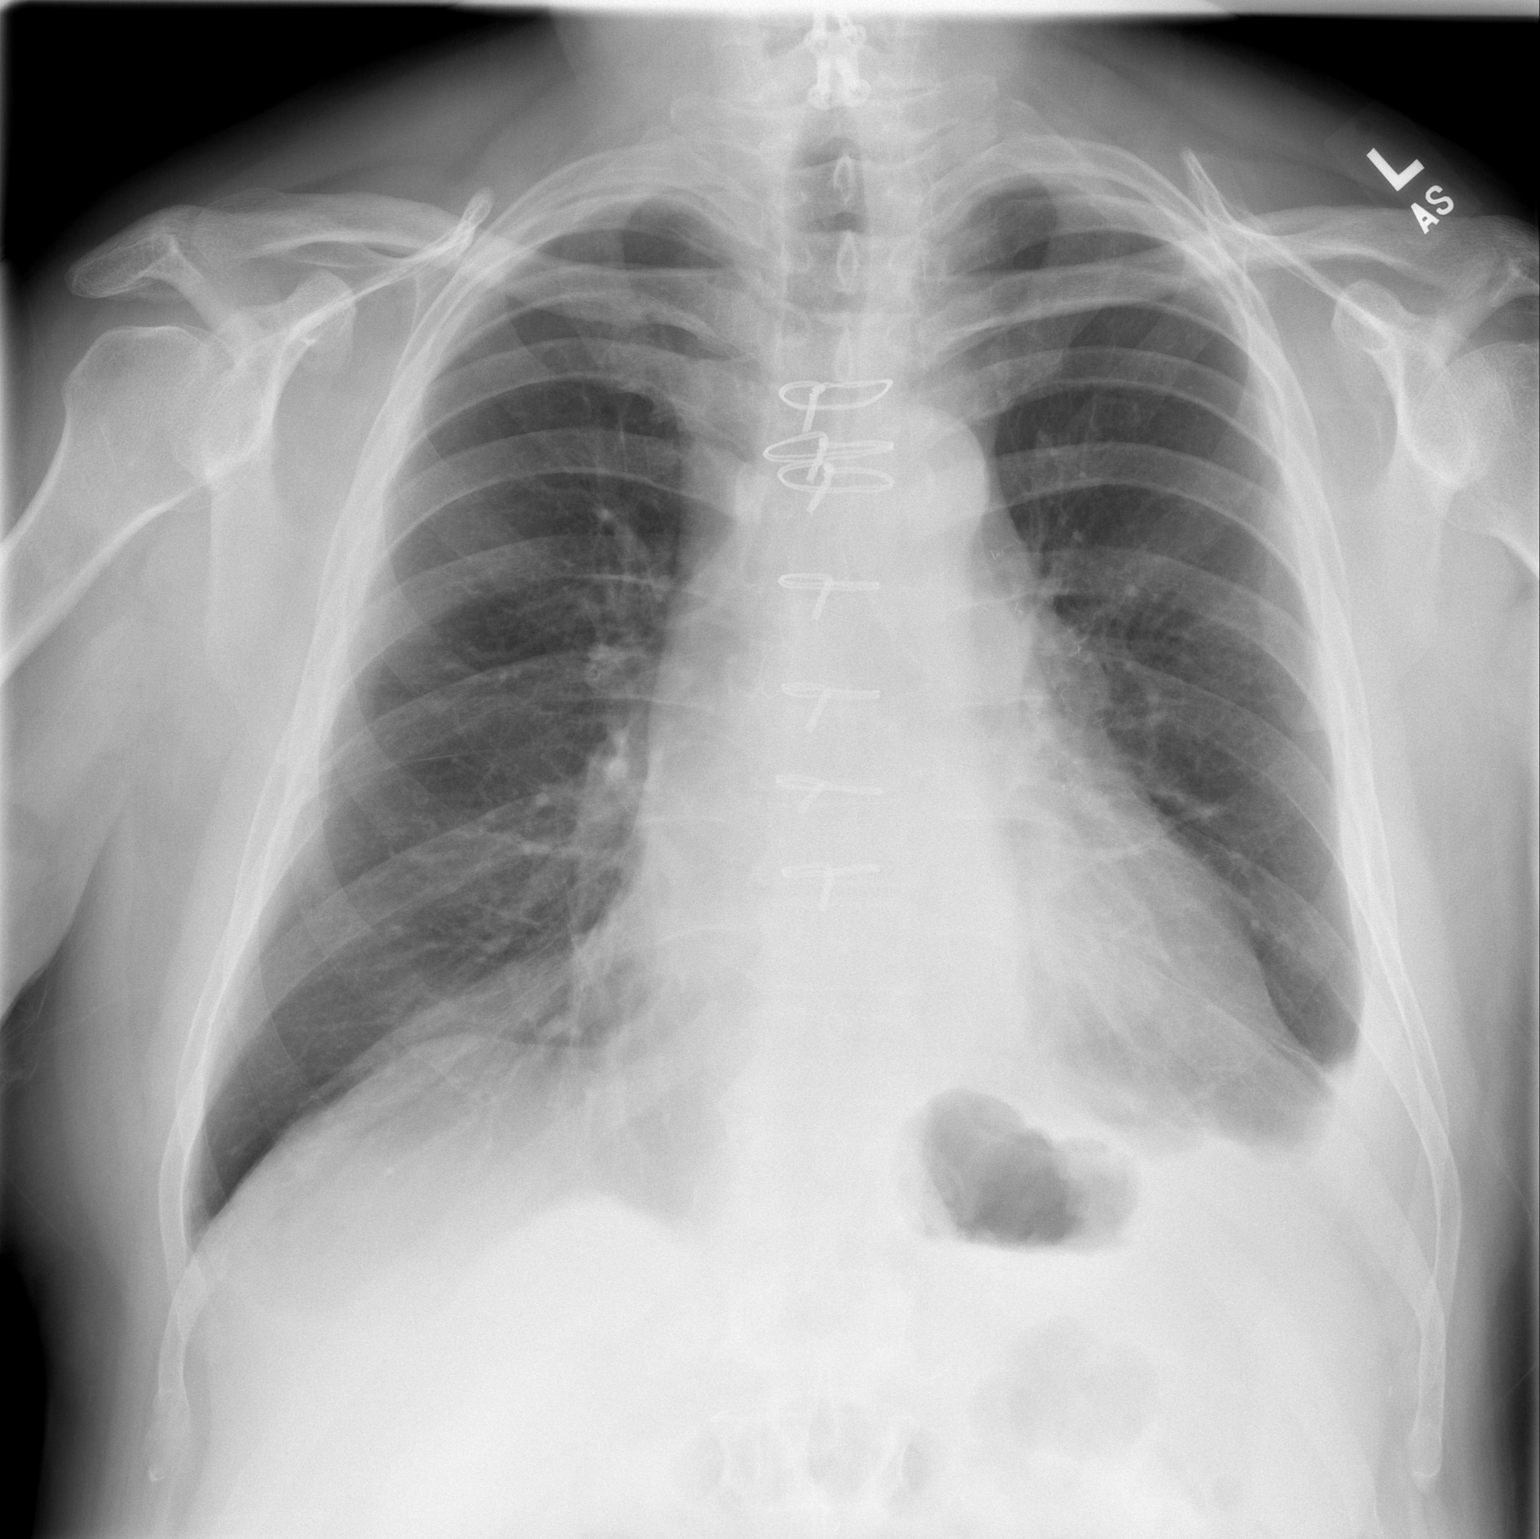

[w chest lat]
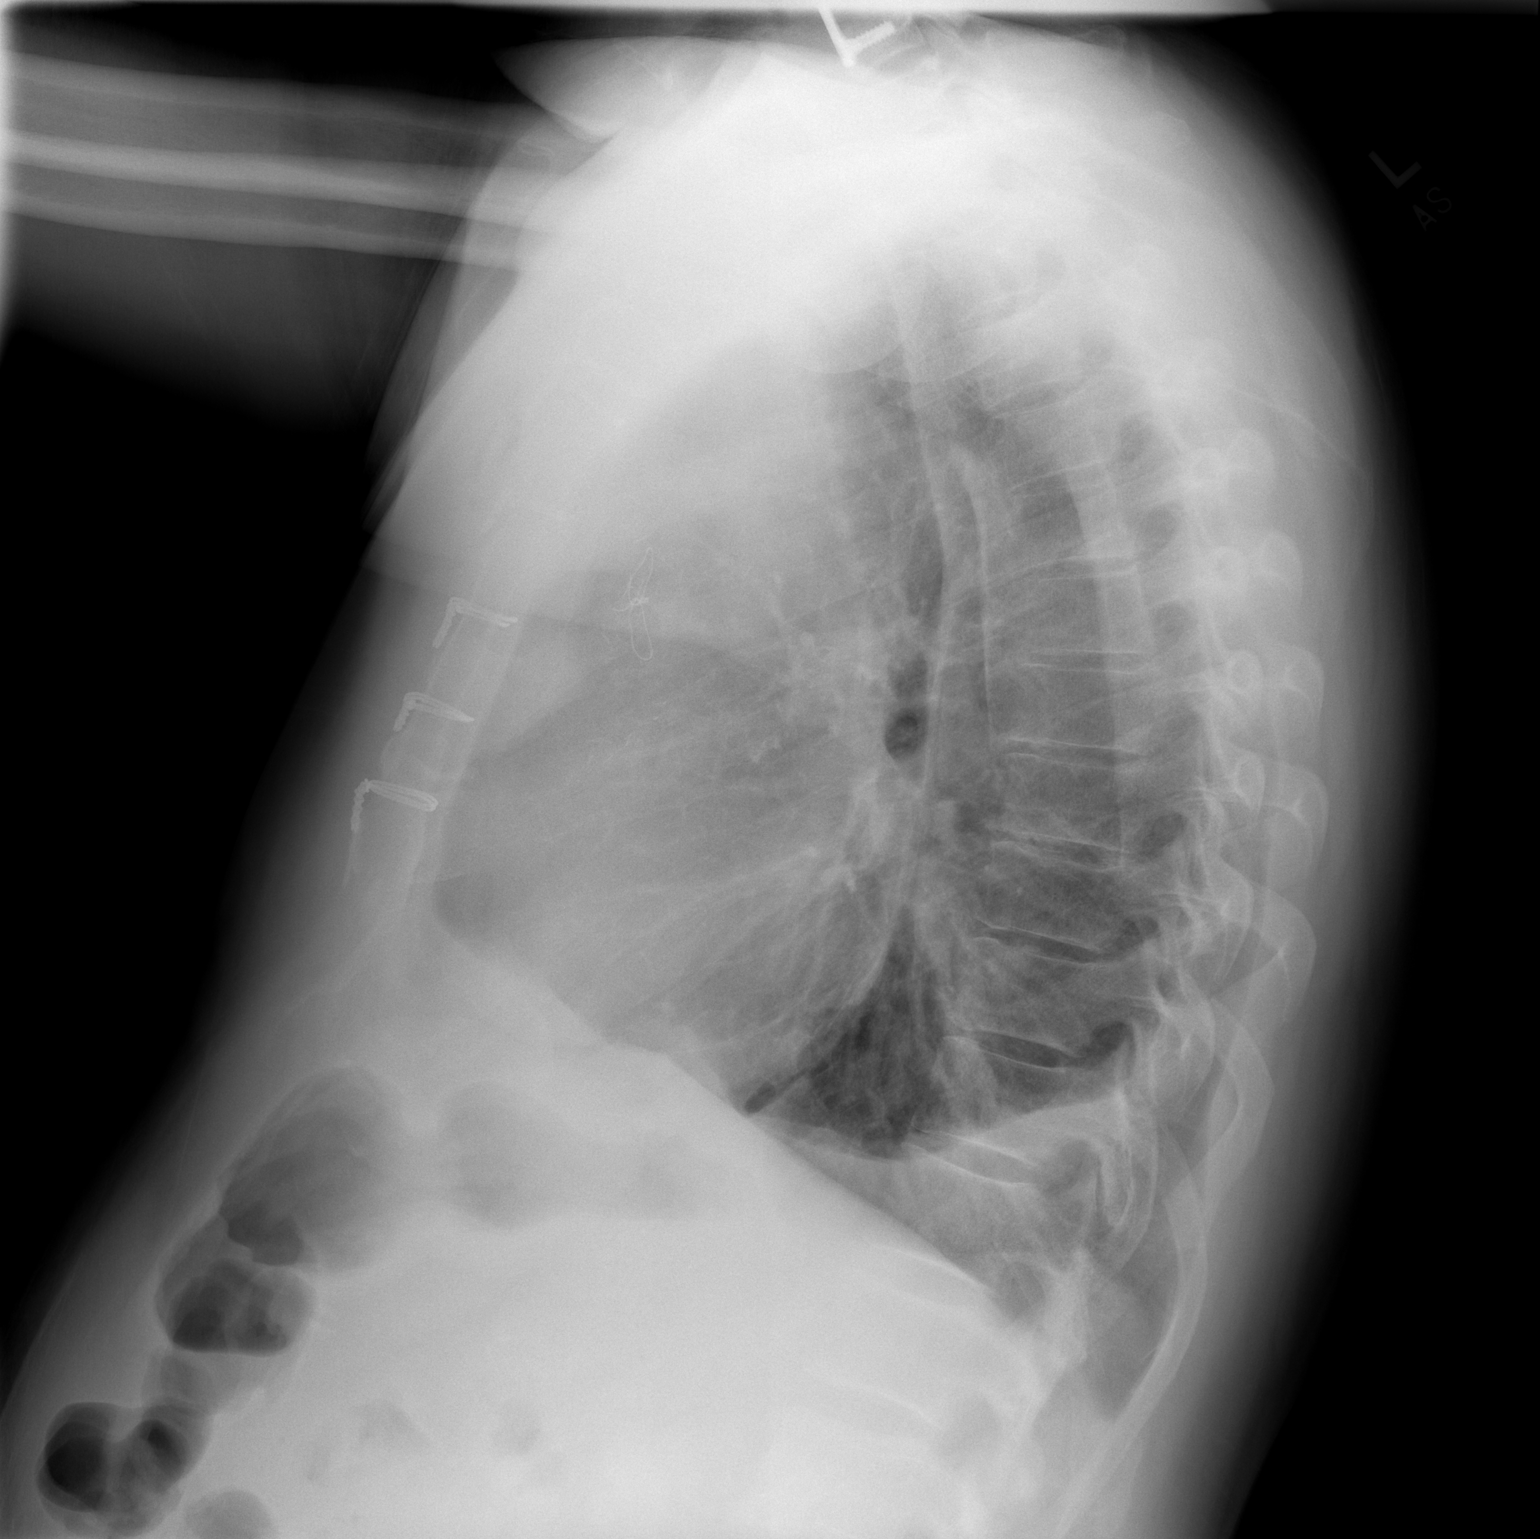

[2 of 2 positions shown; findings below may reference images not displayed]

FINDINGS: Interim removal right IJ line. Prior CABG. Cardiomegaly. Stable
fullness of the anterior mediastinum most likely to patient rotation
and anterior mediastinal fat again noted. Interim near complete
clearing of left base atelectasis and or infiltrate . Small left
pleural effusion. No pneumothorax.
IMPRESSION: 1. Interim removal of right IJ line. No pneumothorax. Prior CABG.
Cardiomegaly.

2. Interim near complete clearing clearing of left base infiltrate
and atelectasis. Small left pleural effusion.

## 2019-03-01 IMAGING — CR DG CHEST 2V
2 series · 2 of 2 positions shown · non-contrast
Comparison: Chest x-ray of May 14, 2016

CLINICAL DATA: Nonproductive cough. CABG 32 days ago. History of
asthma.

EXAM:
CHEST  2 VIEW

[w chest pa]
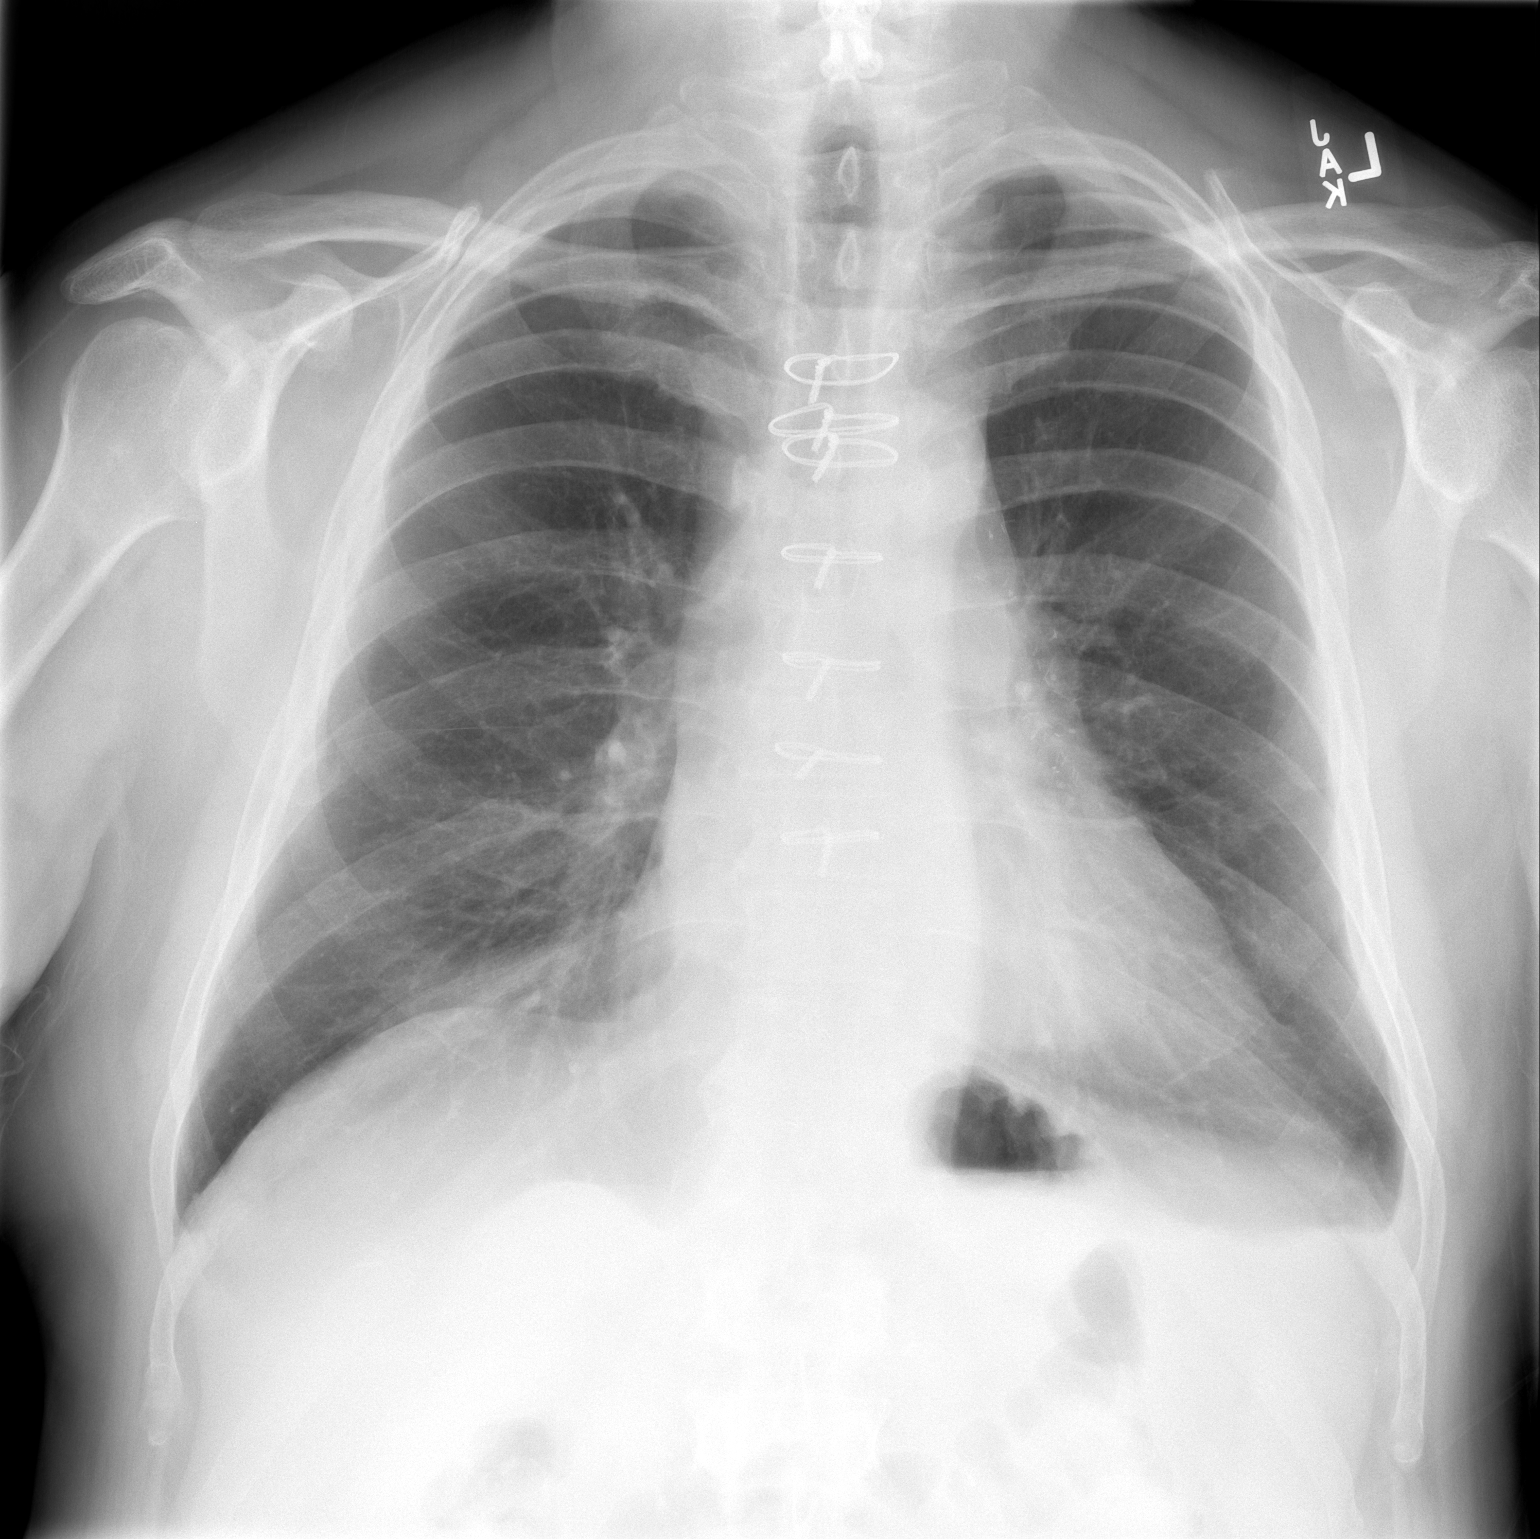

[w chest lat]
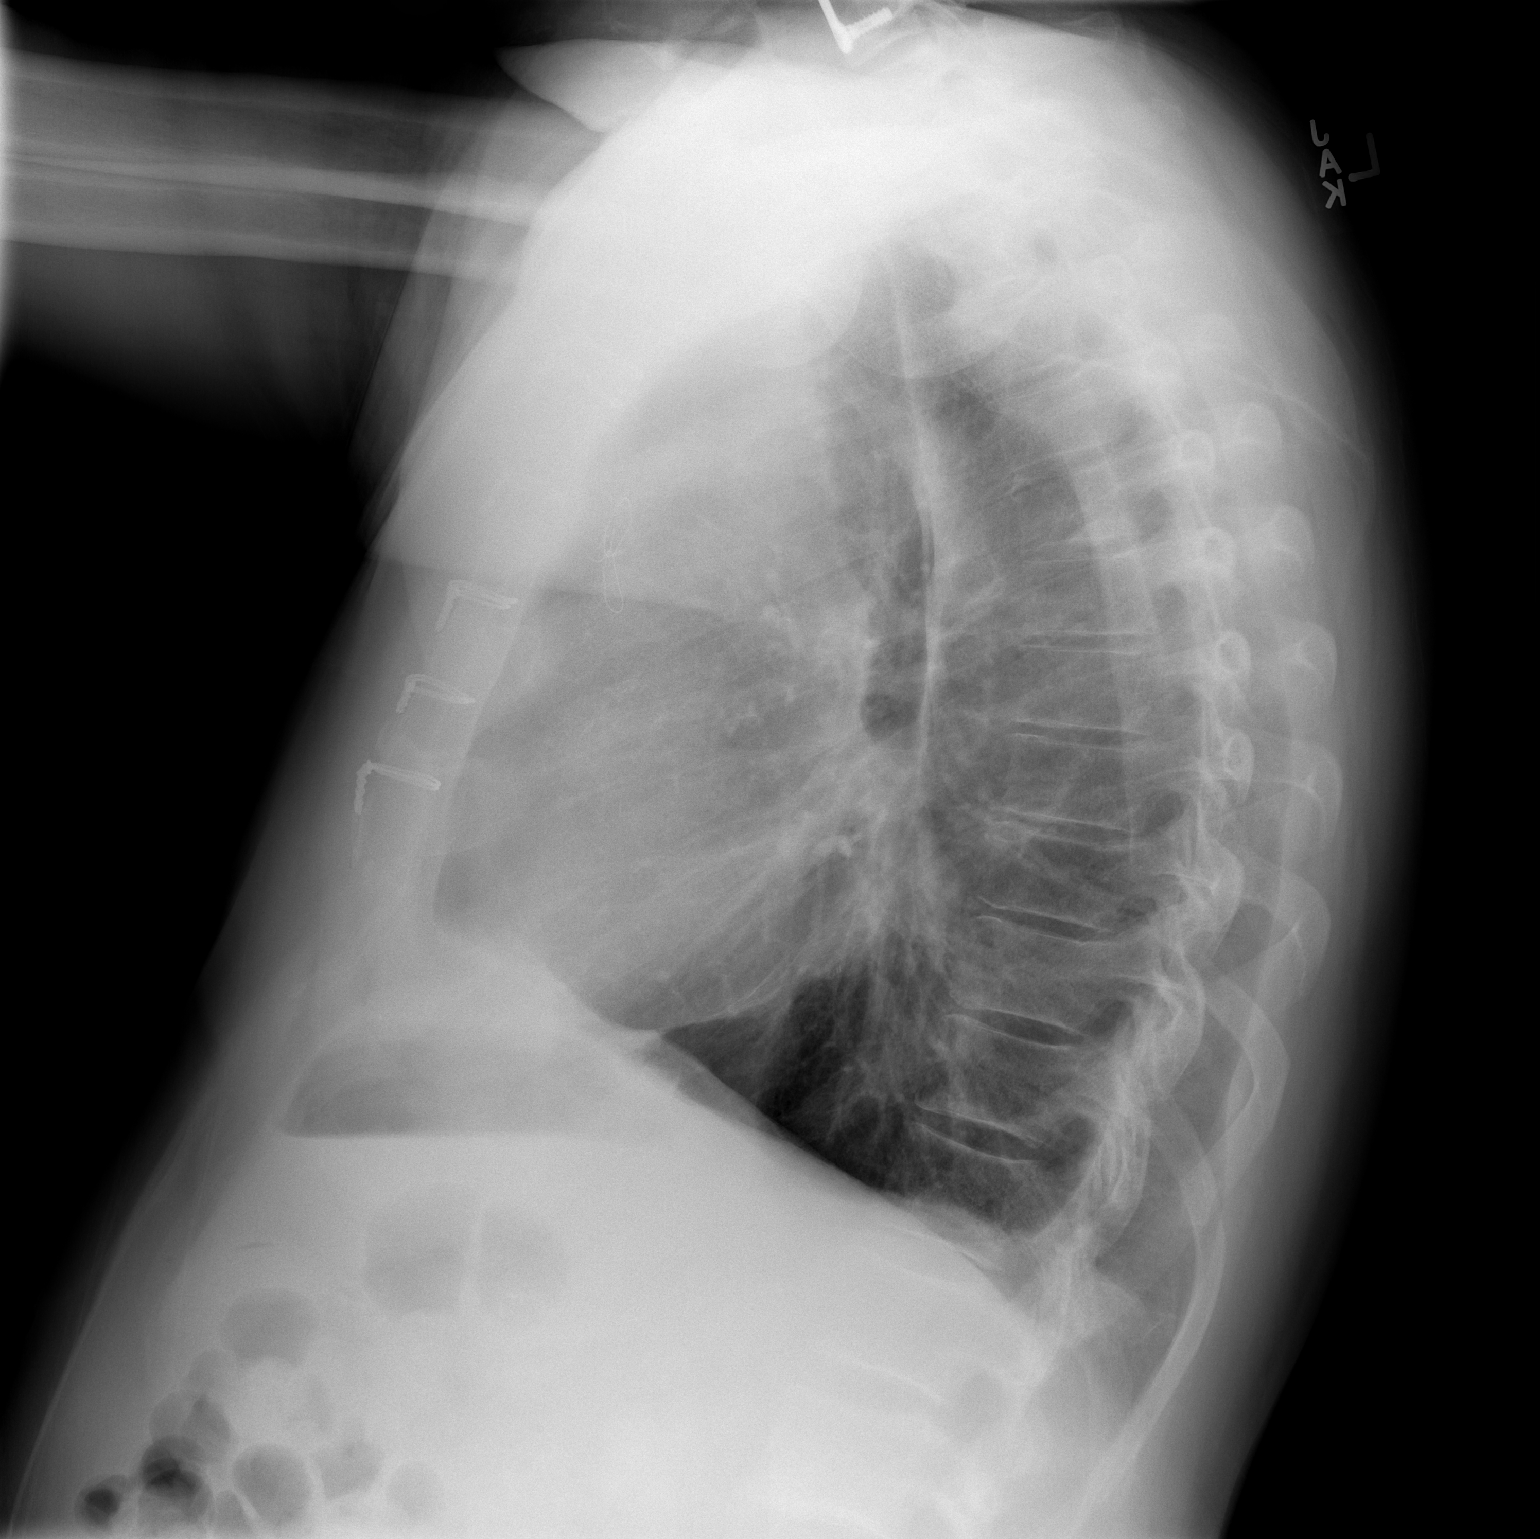

[2 of 2 positions shown; findings below may reference images not displayed]

FINDINGS: The right lung is well-expanded and clear. On the left a tiny
pleural effusion remains. The lung is otherwise clear. The heart is
top-normal in size. The pulmonary vascularity is normal. The sternal
wires are intact. The mediastinum is normal in width. The bony
thorax is unremarkable.
IMPRESSION: And further interval decrease in the volume of pleural fluid on the
left. No CHF, pneumonia, nor other acute cardiopulmonary
abnormality.
